# Patient Record
Sex: Male | Born: 1960 | Race: White | Hispanic: No | Smoking: Former smoker
Health system: Southern US, Community
[De-identification: ages and names within clinical notes are randomized; demographics above are authoritative.]

## PROBLEM LIST (undated history)

## (undated) DIAGNOSIS — T8859XA Other complications of anesthesia, initial encounter: Secondary | ICD-10-CM

## (undated) DIAGNOSIS — Z9289 Personal history of other medical treatment: Secondary | ICD-10-CM

## (undated) DIAGNOSIS — I739 Peripheral vascular disease, unspecified: Secondary | ICD-10-CM

## (undated) DIAGNOSIS — I6529 Occlusion and stenosis of unspecified carotid artery: Secondary | ICD-10-CM

## (undated) DIAGNOSIS — I509 Heart failure, unspecified: Secondary | ICD-10-CM

## (undated) DIAGNOSIS — T4145XA Adverse effect of unspecified anesthetic, initial encounter: Secondary | ICD-10-CM

## (undated) DIAGNOSIS — E119 Type 2 diabetes mellitus without complications: Secondary | ICD-10-CM

## (undated) DIAGNOSIS — M1990 Unspecified osteoarthritis, unspecified site: Secondary | ICD-10-CM

## (undated) DIAGNOSIS — I10 Essential (primary) hypertension: Secondary | ICD-10-CM

## (undated) DIAGNOSIS — K5792 Diverticulitis of intestine, part unspecified, without perforation or abscess without bleeding: Secondary | ICD-10-CM

## (undated) HISTORY — DX: Occlusion and stenosis of unspecified carotid artery: I65.29

## (undated) HISTORY — PX: HERNIA REPAIR: SHX51

## (undated) HISTORY — PX: COLONOSCOPY: SHX174

## (undated) HISTORY — PX: TONSILLECTOMY: SUR1361

## (undated) HISTORY — DX: Heart failure, unspecified: I50.9

## (undated) HISTORY — PX: COLON SURGERY: SHX602

## (undated) HISTORY — DX: Essential (primary) hypertension: I10

## (undated) HISTORY — DX: Diverticulitis of intestine, part unspecified, without perforation or abscess without bleeding: K57.92

## (undated) HISTORY — DX: Type 2 diabetes mellitus without complications: E11.9

---
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
## 2000-03-16 ENCOUNTER — Encounter: Payer: Self-pay | Admitting: Emergency Medicine

## 2000-03-16 ENCOUNTER — Inpatient Hospital Stay (HOSPITAL_COMMUNITY): Admission: EM | Admit: 2000-03-16 | Discharge: 2000-03-18 | Payer: Self-pay | Admitting: Emergency Medicine

## 2001-08-12 ENCOUNTER — Encounter: Payer: Self-pay | Admitting: Emergency Medicine

## 2001-08-12 ENCOUNTER — Emergency Department (HOSPITAL_COMMUNITY): Admission: EM | Admit: 2001-08-12 | Discharge: 2001-08-12 | Payer: Self-pay | Admitting: Emergency Medicine

## 2002-05-03 ENCOUNTER — Encounter: Payer: Self-pay | Admitting: Urology

## 2002-05-03 ENCOUNTER — Ambulatory Visit (HOSPITAL_COMMUNITY): Admission: RE | Admit: 2002-05-03 | Discharge: 2002-05-03 | Payer: Self-pay | Admitting: Urology

## 2005-08-18 ENCOUNTER — Emergency Department (HOSPITAL_COMMUNITY): Admission: EM | Admit: 2005-08-18 | Discharge: 2005-08-18 | Payer: Self-pay | Admitting: Family Medicine

## 2005-11-20 ENCOUNTER — Emergency Department (HOSPITAL_COMMUNITY): Admission: EM | Admit: 2005-11-20 | Discharge: 2005-11-20 | Payer: Self-pay | Admitting: Family Medicine

## 2006-10-07 ENCOUNTER — Emergency Department (HOSPITAL_COMMUNITY): Admission: EM | Admit: 2006-10-07 | Discharge: 2006-10-07 | Payer: Self-pay | Admitting: Family Medicine

## 2006-11-28 ENCOUNTER — Ambulatory Visit: Payer: Self-pay | Admitting: Family Medicine

## 2006-12-01 ENCOUNTER — Ambulatory Visit: Payer: Self-pay | Admitting: Family Medicine

## 2006-12-01 LAB — CONVERTED CEMR LAB
ALT: 14 units/L (ref 0–53)
AST: 20 units/L (ref 0–37)
Calcium: 9.2 mg/dL (ref 8.4–10.5)
Cholesterol: 208 mg/dL — ABNORMAL HIGH (ref 0–200)
Glucose, Bld: 71 mg/dL (ref 70–99)
HDL: 31 mg/dL — ABNORMAL LOW (ref >39)
LDL Cholesterol: 154 mg/dL — ABNORMAL HIGH (ref 0–99)
Sodium: 136 meq/L (ref 135–145)
VLDL: 23 mg/dL (ref 0–40)

## 2007-02-01 ENCOUNTER — Telehealth: Payer: Self-pay | Admitting: Family Medicine

## 2007-05-23 ENCOUNTER — Encounter: Payer: Self-pay | Admitting: Family Medicine

## 2007-05-23 DIAGNOSIS — E785 Hyperlipidemia, unspecified: Secondary | ICD-10-CM

## 2007-05-23 DIAGNOSIS — J309 Allergic rhinitis, unspecified: Secondary | ICD-10-CM | POA: Insufficient documentation

## 2007-05-23 DIAGNOSIS — I10 Essential (primary) hypertension: Secondary | ICD-10-CM | POA: Insufficient documentation

## 2007-05-23 DIAGNOSIS — E669 Obesity, unspecified: Secondary | ICD-10-CM

## 2008-09-19 DIAGNOSIS — J189 Pneumonia, unspecified organism: Secondary | ICD-10-CM

## 2008-09-19 HISTORY — DX: Pneumonia, unspecified organism: J18.9

## 2010-09-03 ENCOUNTER — Encounter
Admission: RE | Admit: 2010-09-03 | Discharge: 2010-09-03 | Payer: Self-pay | Source: Home / Self Care | Attending: Gastroenterology | Admitting: Gastroenterology

## 2010-09-03 ENCOUNTER — Inpatient Hospital Stay (HOSPITAL_COMMUNITY): Admission: AD | Admit: 2010-09-03 | Discharge: 2010-09-10 | Payer: Self-pay | Source: Home / Self Care

## 2010-09-24 ENCOUNTER — Encounter
Admission: RE | Admit: 2010-09-24 | Discharge: 2010-09-24 | Payer: Self-pay | Source: Home / Self Care | Attending: General Surgery | Admitting: General Surgery

## 2010-10-09 ENCOUNTER — Other Ambulatory Visit: Payer: Self-pay | Admitting: General Surgery

## 2010-10-09 DIAGNOSIS — K5792 Diverticulitis of intestine, part unspecified, without perforation or abscess without bleeding: Secondary | ICD-10-CM

## 2010-10-09 DIAGNOSIS — IMO0002 Reserved for concepts with insufficient information to code with codable children: Secondary | ICD-10-CM

## 2010-10-22 ENCOUNTER — Ambulatory Visit
Admission: RE | Admit: 2010-10-22 | Discharge: 2010-10-22 | Disposition: A | Discharge status: Home / Self Care | Payer: Self-pay | Source: Ambulatory Visit | Attending: General Surgery | Admitting: General Surgery

## 2010-10-22 DIAGNOSIS — IMO0002 Reserved for concepts with insufficient information to code with codable children: Secondary | ICD-10-CM

## 2010-10-22 DIAGNOSIS — K5792 Diverticulitis of intestine, part unspecified, without perforation or abscess without bleeding: Secondary | ICD-10-CM

## 2010-10-22 MED ORDER — IOHEXOL 300 MG/ML  SOLN: 100.0000 mL | Freq: Once | INTRAMUSCULAR | Status: AC | PRN

## 2010-11-17 ENCOUNTER — Other Ambulatory Visit (HOSPITAL_COMMUNITY): Payer: Self-pay | Admitting: General Surgery

## 2010-11-17 ENCOUNTER — Encounter (HOSPITAL_COMMUNITY)
Admission: RE | Admit: 2010-11-17 | Discharge: 2010-11-17 | Disposition: A | Discharge status: Home / Self Care | Payer: BC Managed Care – PPO | Source: Ambulatory Visit | Attending: General Surgery | Admitting: General Surgery

## 2010-11-17 ENCOUNTER — Ambulatory Visit (HOSPITAL_COMMUNITY)
Admission: RE | Admit: 2010-11-17 | Discharge: 2010-11-17 | Disposition: A | Discharge status: Home / Self Care | Payer: BC Managed Care – PPO | Source: Ambulatory Visit | Attending: General Surgery | Admitting: General Surgery

## 2010-11-17 DIAGNOSIS — Z01812 Encounter for preprocedural laboratory examination: Secondary | ICD-10-CM | POA: Insufficient documentation

## 2010-11-17 DIAGNOSIS — K5732 Diverticulitis of large intestine without perforation or abscess without bleeding: Secondary | ICD-10-CM

## 2010-11-17 DIAGNOSIS — Z01818 Encounter for other preprocedural examination: Secondary | ICD-10-CM | POA: Insufficient documentation

## 2010-11-17 DIAGNOSIS — Z87891 Personal history of nicotine dependence: Secondary | ICD-10-CM | POA: Insufficient documentation

## 2010-11-17 DIAGNOSIS — Z01810 Encounter for preprocedural cardiovascular examination: Secondary | ICD-10-CM | POA: Insufficient documentation

## 2010-11-17 LAB — SURGICAL PCR SCREEN: MRSA, PCR: NEGATIVE

## 2010-11-17 LAB — COMPREHENSIVE METABOLIC PANEL
Albumin: 4 g/dL (ref 3.5–5.2)
BUN: 9 mg/dL (ref 6–23)
CO2: 28 mEq/L (ref 19–32)
Chloride: 107 mEq/L (ref 96–112)
GFR calc Af Amer: >60 mL/min (ref >60)
Potassium: 5.2 mEq/L — ABNORMAL HIGH (ref 3.5–5.1)
Sodium: 142 mEq/L (ref 135–145)
Total Bilirubin: 0.3 mg/dL (ref 0.3–1.2)

## 2010-11-17 LAB — CBC
HCT: 46.1 % (ref 39.0–52.0)
MCH: 31.7 pg (ref 26.0–34.0)
MCHC: 34.9 g/dL (ref 30.0–36.0)
Platelets: 299 10*3/uL (ref 150–400)
RDW: 15.8 % — ABNORMAL HIGH (ref 11.5–15.5)
WBC: 17.2 10*3/uL — ABNORMAL HIGH (ref 4.0–10.5)

## 2010-11-17 LAB — DIFFERENTIAL
Monocytes Absolute: 1.5 10*3/uL — ABNORMAL HIGH (ref 0.1–1.0)
Monocytes Relative: 9 % (ref 3–12)
Neutro Abs: 13.1 10*3/uL — ABNORMAL HIGH (ref 1.7–7.7)
Neutrophils Relative %: 76 % (ref 43–77)

## 2010-11-18 ENCOUNTER — Other Ambulatory Visit: Payer: Self-pay | Admitting: General Surgery

## 2010-11-18 DIAGNOSIS — K5792 Diverticulitis of intestine, part unspecified, without perforation or abscess without bleeding: Secondary | ICD-10-CM

## 2010-11-18 DIAGNOSIS — D72829 Elevated white blood cell count, unspecified: Secondary | ICD-10-CM

## 2010-11-19 ENCOUNTER — Ambulatory Visit
Admission: RE | Admit: 2010-11-19 | Discharge: 2010-11-19 | Disposition: A | Discharge status: Home / Self Care | Payer: BC Managed Care – PPO | Source: Ambulatory Visit | Attending: General Surgery | Admitting: General Surgery

## 2010-11-19 ENCOUNTER — Other Ambulatory Visit: Payer: BC Managed Care – PPO

## 2010-11-19 DIAGNOSIS — K5792 Diverticulitis of intestine, part unspecified, without perforation or abscess without bleeding: Secondary | ICD-10-CM

## 2010-11-19 DIAGNOSIS — D72829 Elevated white blood cell count, unspecified: Secondary | ICD-10-CM

## 2010-11-19 MED ORDER — IOHEXOL 300 MG/ML  SOLN
100.0000 mL | Freq: Once | INTRAMUSCULAR | Status: AC | PRN
  Administered 2010-11-19: 100 mL via INTRAVENOUS

## 2010-11-25 ENCOUNTER — Other Ambulatory Visit: Payer: Self-pay | Admitting: General Surgery

## 2010-11-25 ENCOUNTER — Inpatient Hospital Stay (HOSPITAL_COMMUNITY): Payer: BC Managed Care – PPO

## 2010-11-25 ENCOUNTER — Inpatient Hospital Stay (HOSPITAL_COMMUNITY)
Admission: RE | Admit: 2010-11-25 | Discharge: 2010-12-07 | DRG: 585 | Disposition: A | Discharge status: Home / Self Care | Payer: BC Managed Care – PPO | Source: Ambulatory Visit | Attending: General Surgery | Admitting: General Surgery

## 2010-11-25 DIAGNOSIS — R000 Tachycardia, unspecified: Secondary | ICD-10-CM | POA: Diagnosis not present

## 2010-11-25 DIAGNOSIS — J189 Pneumonia, unspecified organism: Secondary | ICD-10-CM | POA: Diagnosis not present

## 2010-11-25 DIAGNOSIS — D62 Acute posthemorrhagic anemia: Secondary | ICD-10-CM | POA: Diagnosis not present

## 2010-11-25 DIAGNOSIS — F17200 Nicotine dependence, unspecified, uncomplicated: Secondary | ICD-10-CM | POA: Diagnosis present

## 2010-11-25 DIAGNOSIS — A0472 Enterocolitis due to Clostridium difficile, not specified as recurrent: Secondary | ICD-10-CM | POA: Diagnosis not present

## 2010-11-25 DIAGNOSIS — K5732 Diverticulitis of large intestine without perforation or abscess without bleeding: Principal | ICD-10-CM | POA: Diagnosis present

## 2010-11-25 DIAGNOSIS — K560 Paralytic ileus: Secondary | ICD-10-CM | POA: Diagnosis not present

## 2010-11-25 DIAGNOSIS — I10 Essential (primary) hypertension: Secondary | ICD-10-CM | POA: Diagnosis present

## 2010-11-25 LAB — CBC
HCT: 43.2 % (ref 39.0–52.0)
Hemoglobin: 10.6 g/dL — ABNORMAL LOW (ref 13.0–17.0)
MCH: 30.9 pg (ref 26.0–34.0)
Platelets: 281 10*3/uL (ref 150–400)
RBC: 3.43 MIL/uL — ABNORMAL LOW (ref 4.22–5.81)
RDW: 15.2 % (ref 11.5–15.5)
WBC: 12.2 10*3/uL — ABNORMAL HIGH (ref 4.0–10.5)
WBC: 25.6 10*3/uL — ABNORMAL HIGH (ref 4.0–10.5)

## 2010-11-25 LAB — DIFFERENTIAL
Basophils Relative: 0 % (ref 0–1)
Eosinophils Absolute: 0.5 10*3/uL (ref 0.0–0.7)
Eosinophils Relative: 4 % (ref 0–5)
Lymphocytes Relative: 19 % (ref 12–46)
Lymphs Abs: 2.3 10*3/uL (ref 0.7–4.0)
Monocytes Absolute: 1.1 10*3/uL — ABNORMAL HIGH (ref 0.1–1.0)
Monocytes Relative: 9 % (ref 3–12)
Neutro Abs: 8.3 10*3/uL — ABNORMAL HIGH (ref 1.7–7.7)
Neutrophils Relative %: 68 % (ref 43–77)

## 2010-11-25 LAB — BASIC METABOLIC PANEL
BUN: 7 mg/dL (ref 6–23)
CO2: 18 mEq/L — ABNORMAL LOW (ref 19–32)
Glucose, Bld: 131 mg/dL — ABNORMAL HIGH (ref 70–99)
Sodium: 133 mEq/L — ABNORMAL LOW (ref 135–145)

## 2010-11-25 LAB — MAGNESIUM: Magnesium: 1.7 mg/dL (ref 1.5–2.5)

## 2010-11-26 ENCOUNTER — Inpatient Hospital Stay (HOSPITAL_COMMUNITY): Payer: BC Managed Care – PPO

## 2010-11-26 LAB — BASIC METABOLIC PANEL
BUN: 18 mg/dL (ref 6–23)
BUN: 26 mg/dL — ABNORMAL HIGH (ref 6–23)
CO2: 22 mEq/L (ref 19–32)
CO2: 21 mEq/L (ref 19–32)
Chloride: 108 mEq/L (ref 96–112)
Creatinine, Ser: 1.79 mg/dL — ABNORMAL HIGH (ref 0.4–1.5)
GFR calc Af Amer: 57 mL/min — ABNORMAL LOW (ref >60)
GFR calc non Af Amer: 40 mL/min — ABNORMAL LOW (ref >60)
GFR calc non Af Amer: 47 mL/min — ABNORMAL LOW (ref >60)
Glucose, Bld: 113 mg/dL — ABNORMAL HIGH (ref 70–99)
Potassium: 5.2 mEq/L — ABNORMAL HIGH (ref 3.5–5.1)
Potassium: 4 mEq/L (ref 3.5–5.1)
Sodium: 137 mEq/L (ref 135–145)

## 2010-11-26 LAB — CBC
MCH: 31.3 pg (ref 26.0–34.0)
MCV: 87.5 fL (ref 78.0–100.0)
RBC: 2.72 MIL/uL — ABNORMAL LOW (ref 4.22–5.81)
RDW: 15.5 % (ref 11.5–15.5)
WBC: 19.2 10*3/uL — ABNORMAL HIGH (ref 4.0–10.5)
WBC: 16.8 10*3/uL — ABNORMAL HIGH (ref 4.0–10.5)

## 2010-11-27 LAB — CBC
Hemoglobin: 8.3 g/dL — ABNORMAL LOW (ref 13.0–17.0)
Hemoglobin: 8.3 g/dL — ABNORMAL LOW (ref 13.0–17.0)
MCH: 30.6 pg (ref 26.0–34.0)
MCH: 30.5 pg (ref 26.0–34.0)
MCV: 88.6 fL (ref 78.0–100.0)
MCV: 87.9 fL (ref 78.0–100.0)
RBC: 2.71 MIL/uL — ABNORMAL LOW (ref 4.22–5.81)
RBC: 2.72 MIL/uL — ABNORMAL LOW (ref 4.22–5.81)

## 2010-11-27 LAB — BASIC METABOLIC PANEL
BUN: 22 mg/dL (ref 6–23)
Chloride: 110 mEq/L (ref 96–112)
Glucose, Bld: 103 mg/dL — ABNORMAL HIGH (ref 70–99)
Potassium: 3.8 mEq/L (ref 3.5–5.1)

## 2010-11-27 LAB — MAGNESIUM: Magnesium: 2 mg/dL (ref 1.5–2.5)

## 2010-11-28 LAB — BASIC METABOLIC PANEL
BUN: 20 mg/dL (ref 6–23)
CO2: 18 mEq/L — ABNORMAL LOW (ref 19–32)
Chloride: 112 mEq/L (ref 96–112)
GFR calc non Af Amer: >60 mL/min (ref >60)
Glucose, Bld: 96 mg/dL (ref 70–99)
Potassium: 3.8 mEq/L (ref 3.5–5.1)

## 2010-11-28 LAB — CBC
HCT: 25.7 % — ABNORMAL LOW (ref 39.0–52.0)
Hemoglobin: 8.9 g/dL — ABNORMAL LOW (ref 13.0–17.0)
MCV: 87.7 fL (ref 78.0–100.0)
RDW: 15.9 % — ABNORMAL HIGH (ref 11.5–15.5)
WBC: 19 10*3/uL — ABNORMAL HIGH (ref 4.0–10.5)

## 2010-11-28 LAB — TYPE AND SCREEN
ABO/RH(D): O POS
Unit division: 0
Unit division: 0
Unit division: 0

## 2010-11-29 ENCOUNTER — Inpatient Hospital Stay (HOSPITAL_COMMUNITY): Payer: BC Managed Care – PPO

## 2010-11-29 LAB — COMPREHENSIVE METABOLIC PANEL
ALT: 19 U/L (ref 0–53)
AST: 21 U/L (ref 0–37)
Albumin: 3.4 g/dL — ABNORMAL LOW (ref 3.5–5.2)
CO2: 20 mEq/L (ref 19–32)
Calcium: 8.9 mg/dL (ref 8.4–10.5)
Creatinine, Ser: 0.65 mg/dL (ref 0.4–1.5)
GFR calc Af Amer: >60 mL/min (ref >60)
GFR calc non Af Amer: >60 mL/min (ref >60)
Sodium: 129 mEq/L — ABNORMAL LOW (ref 135–145)
Total Protein: 7.8 g/dL (ref 6.0–8.3)

## 2010-11-29 LAB — URINALYSIS, ROUTINE W REFLEX MICROSCOPIC
Bilirubin Urine: NEGATIVE
Bilirubin Urine: NEGATIVE
Glucose, UA: NEGATIVE mg/dL
Ketones, ur: NEGATIVE mg/dL
Nitrite: NEGATIVE
Specific Gravity, Urine: 1.008 (ref 1.005–1.030)
Urobilinogen, UA: 1 mg/dL (ref 0.0–1.0)
pH: 5.5 (ref 5.0–8.0)
pH: 6 (ref 5.0–8.0)

## 2010-11-29 LAB — CBC
HCT: 22.2 % — ABNORMAL LOW (ref 39.0–52.0)
HCT: 35.7 % — ABNORMAL LOW (ref 39.0–52.0)
HCT: 35.9 % — ABNORMAL LOW (ref 39.0–52.0)
Hemoglobin: 7.7 g/dL — ABNORMAL LOW (ref 13.0–17.0)
Hemoglobin: 14.5 g/dL (ref 13.0–17.0)
MCH: 30.6 pg (ref 26.0–34.0)
MCH: 31 pg (ref 26.0–34.0)
MCH: 30.3 pg (ref 26.0–34.0)
MCHC: 33.7 g/dL (ref 30.0–36.0)
MCHC: 34.2 g/dL (ref 30.0–36.0)
MCHC: 35.3 g/dL (ref 30.0–36.0)
MCHC: 34.8 g/dL (ref 30.0–36.0)
MCV: 89.5 fL (ref 78.0–100.0)
Platelets: 405 10*3/uL — ABNORMAL HIGH (ref 150–400)
Platelets: 420 10*3/uL — ABNORMAL HIGH (ref 150–400)
Platelets: 446 10*3/uL — ABNORMAL HIGH (ref 150–400)
Platelets: 245 10*3/uL (ref 150–400)
RBC: 2.53 MIL/uL — ABNORMAL LOW (ref 4.22–5.81)
RBC: 4.67 MIL/uL (ref 4.22–5.81)
RBC: 2.67 MIL/uL — ABNORMAL LOW (ref 4.22–5.81)
RDW: 13.3 % (ref 11.5–15.5)
RDW: 13.3 % (ref 11.5–15.5)
WBC: 17.6 10*3/uL — ABNORMAL HIGH (ref 4.0–10.5)
WBC: 13.8 10*3/uL — ABNORMAL HIGH (ref 4.0–10.5)
WBC: 9.5 10*3/uL (ref 4.0–10.5)

## 2010-11-29 LAB — URINE MICROSCOPIC-ADD ON

## 2010-11-29 LAB — BASIC METABOLIC PANEL
BUN: 1 mg/dL — ABNORMAL LOW (ref 6–23)
BUN: 4 mg/dL — ABNORMAL LOW (ref 6–23)
CO2: 20 mEq/L (ref 19–32)
CO2: 23 mEq/L (ref 19–32)
Chloride: 103 mEq/L (ref 96–112)
Creatinine, Ser: 0.61 mg/dL (ref 0.4–1.5)
Creatinine, Ser: 0.68 mg/dL (ref 0.4–1.5)
GFR calc Af Amer: >60 mL/min (ref >60)
GFR calc non Af Amer: >60 mL/min (ref >60)
Glucose, Bld: 109 mg/dL — ABNORMAL HIGH (ref 70–99)
Glucose, Bld: 76 mg/dL (ref 70–99)
Glucose, Bld: 94 mg/dL (ref 70–99)
Potassium: 3.3 mEq/L — ABNORMAL LOW (ref 3.5–5.1)
Potassium: 3.9 mEq/L (ref 3.5–5.1)
Potassium: 4 mEq/L (ref 3.5–5.1)
Sodium: 136 mEq/L (ref 135–145)

## 2010-11-29 LAB — EXPECTORATED SPUTUM ASSESSMENT W GRAM STAIN, RFLX TO RESP C

## 2010-11-30 LAB — EXPECTORATED SPUTUM ASSESSMENT W GRAM STAIN, RFLX TO RESP C

## 2010-11-30 LAB — URINE CULTURE
Culture: NO GROWTH
Special Requests: NEGATIVE

## 2010-11-30 LAB — CBC
Hemoglobin: 8.7 g/dL — ABNORMAL LOW (ref 13.0–17.0)
MCH: 30.1 pg (ref 26.0–34.0)
MCV: 87.2 fL (ref 78.0–100.0)
Platelets: 264 10*3/uL (ref 150–400)
RBC: 2.89 MIL/uL — ABNORMAL LOW (ref 4.22–5.81)
WBC: 18.6 10*3/uL — ABNORMAL HIGH (ref 4.0–10.5)

## 2010-11-30 LAB — BASIC METABOLIC PANEL
BUN: 9 mg/dL (ref 6–23)
CO2: 22 mEq/L (ref 19–32)
Chloride: 107 mEq/L (ref 96–112)
Creatinine, Ser: 1.02 mg/dL (ref 0.4–1.5)
GFR calc Af Amer: >60 mL/min (ref >60)
Potassium: 3.5 mEq/L (ref 3.5–5.1)

## 2010-12-01 LAB — BASIC METABOLIC PANEL
BUN: 9 mg/dL (ref 6–23)
CO2: 21 mEq/L (ref 19–32)
Calcium: 8.3 mg/dL — ABNORMAL LOW (ref 8.4–10.5)
Creatinine, Ser: 1.12 mg/dL (ref 0.4–1.5)
GFR calc non Af Amer: >60 mL/min (ref >60)
Glucose, Bld: 121 mg/dL — ABNORMAL HIGH (ref 70–99)

## 2010-12-01 LAB — CBC
HCT: 24.7 % — ABNORMAL LOW (ref 39.0–52.0)
Hemoglobin: 8.5 g/dL — ABNORMAL LOW (ref 13.0–17.0)
MCH: 29.9 pg (ref 26.0–34.0)
MCHC: 34.4 g/dL (ref 30.0–36.0)
MCV: 87 fL (ref 78.0–100.0)
RDW: 15.1 % (ref 11.5–15.5)

## 2010-12-02 LAB — CBC
Hemoglobin: 7.1 g/dL — ABNORMAL LOW (ref 13.0–17.0)
MCH: 30.1 pg (ref 26.0–34.0)
MCHC: 34.6 g/dL (ref 30.0–36.0)
Platelets: 386 10*3/uL (ref 150–400)
RDW: 15.1 % (ref 11.5–15.5)

## 2010-12-02 LAB — CLOSTRIDIUM DIFFICILE BY PCR

## 2010-12-02 NOTE — Op Note (Signed)
[name], [name] NO.:  [id number]  MEDICAL RECORD NO.:  [id number]           PATIENT TYPE:  I  LOCATION:  3311                         FACILITY:  [facility]  PHYSICIAN:  [name], MDDATE OF BIRTH:  25-Dec-1960  DATE OF PROCEDURE:  11/25/2010 DATE OF DISCHARGE:                              OPERATIVE REPORT   PREOPERATIVE DIAGNOSIS:  Persistent diverticulitis status post abscess drainage.  POSTOPERATIVE DIAGNOSIS:  Persistent diverticulitis status post abscess drainage.  PROCEDURES: 1. Diagnostic laparoscopy. 2. Open sigmoid colectomy with 33-mm EEA primary anastomosis. 3. Appendectomy. 4. Ureteral stents by Dr. Larey [name].  SURGEON:  [name] Gosling, MD  ASSISTANT:  [name]. [name] Dakins, MD  ANESTHESIA:  General.  SUPERVISING ANESTHESIOLOGIST:  [name] Beach, MD  SPECIMENS: 1. Sigmoid colon. 2. Appendix.  ESTIMATED BLOOD LOSS:  100 mL.  COMPLICATIONS:  None.  DRAINS:  A 19-French Blake drain to the pelvis.  DISPOSITION:  To recovery room in stable condition.  INDICATIONS:  This is a 50 year old male with multiple episodes of diverticulitis.  His last episode really began in the summer and has really not gone away completely.  If he has been off antibiotics, is flared backup every time.  He had a colonoscopy in mid November, which showed multiple diverticula, no bleeding, but narrowing and marked edema and inflammation over the sigmoid colon.  He also had an abscess in December that was percutaneously drained, eventually had this removed, and this resolved.  He continued to still have some symptoms.  White blood cell count was still 17 prior to this.  He and I discussed a sigmoid colectomy and I would attempt to do laparoscopically, but we discussed the risks including an open procedure.  PROCEDURE:  After informed consent was obtained, the patient was taken to the operating room.  He underwent a bowel preparation the  day before. He was administered 1 g of intravenous Invanz.  Sequential compression devices were placed on lower extremities prior to induction of anesthesia.  He was then placed on general endotracheal anesthesia without complications.  He was placed in lithotomy.  Dr. Vonita [name] first placed the ureteral stents.  I then came in the room.  He was re-prepped and draped in a standard sterile surgical fashion.  A surgical time-out was then performed.  I then infiltrated 0.25% Marcaine in the left upper quadrant, made an incision, and I entered the abdominal cavity with a 5-mm OptiVu trocar. This was done without any injury.  He was then insufflated to 50 mmHg pressure.  I placed a 10-mm port just below his umbilicus.  Another 5-mm port supraumbilical region and another 5-mm port in the right lower quadrant under direct vision without complication.  He had very little space to work with, but there was a rock hard mass that was very adherent to the surrounding structures in the pelvis.  After some time of attempting to get a good view laparoscopically and take this down, I decided that it would be just safer given this area to do this in an open fashion.  I then made an incision around his umbilicus and inserted a Bookwalter retractor.  Finger fracture dissection was used to remove the appendix, the sigmoid colon from its rock hard attachments to both the sidewall as well as around towards the bladder where this was not as hard of an attachment, but it certainly was stuck to his bladder without any evidence of a fistula.  This eventually was freed.  There was a fairly short segment of disease.  We released the white line of Toldt in its entirety on the left side.  I then stapled across with the left colon proximally and marked this with a stitch.  Then we identified both stents and both ureters were identified and preserved throughout the operation.  The bladder was not injured either.  He did  not make really any urine throughout the operation until the end when I milked his Foley and we put the stent down and took them off his leg.  I discussed this with Dr. Vonita [name] during the operation.  I then took his mesentery down with a combination of Kelly's, 2-0 silk ties, and the LigaSure device all the way down into his high rectum where this was healthy.  We dissected this rectum free and came across this with a contour stapler. Irrigation was then performed, inspected the ureters, both of these were intact.  The irrigation was clear.  There were a couple smaller areas that were bleeding that were oversewed with 3-0 silk sutures.  At this point, we did remove his appendix with a GIA stapler and I came across his mesentery with the LigaSure device and tied this with a 2-0 silk suture.  I then opened his proximal colon, a 33 mm sizer fit this well. I then placed a pursestring suture of 2-0 Prolene.  This was cleaned off.  I placed an additional 3-0 silk suture to secure this around the anvil, this all appeared nice and healthy.  The proximal colon reached down into the pelvis without any tension at all.  I then went below and I placed the EEA stapler and this was brought out through the staple line, mated to the anvil, and pulled down and fired.  There were two complete donuts after removing this.  This was then tested with a rigid proctoscope from the 33-mm anastomosis and this was watertight and the anastomosis looked well and was patent with the proctoscope also.  There was an abscess cavity posterior to the sigmoid colon, but this was just a chronic cavity and there was no active infection with this at all.  He did well during the operation.  Dr. Zachery [name] and I both discussed a loop ileostomy, which I had marked for previously and both of [place] thought it would be reasonable given our findings at the operation not to do a loop ileostomy as this was a high anastomosis under no  tension, looked very healthy, and there was no active infection at all during the case. He was then irrigated.  I did place a 19-French Blake drain into the pelvis underneath this and secured this with a 2-0 nylon.  It was then irrigated copiously.  The omentum was brought down over the bowel. This was then closed with #1 looped PDS.  Staples were used to close the skin and I left Telfa wicks throughout his incision as well.  Sterile dressings were placed.  I removed his stents.  He tolerated this well, was extubated in the operating room, and transferred to the recovery room in a stable condition.     [name] Gosling, MD  MCW/MEDQ  D:  11/25/2010  T:  11/26/2010  Job:  [id number]  cc:   Fayrene Fearing L. Malon Kindle., M.D.  Electronically Signed by Emelia Loron MD on 12/02/2010 09:27:39 AM

## 2010-12-03 LAB — CROSSMATCH
ABO/RH(D): O POS
Unit division: 0

## 2010-12-03 LAB — CBC
HCT: 26.5 % — ABNORMAL LOW (ref 39.0–52.0)
Hemoglobin: 9.1 g/dL — ABNORMAL LOW (ref 13.0–17.0)
MCHC: 34.3 g/dL (ref 30.0–36.0)
MCV: 87.2 fL (ref 78.0–100.0)
WBC: 13.5 10*3/uL — ABNORMAL HIGH (ref 4.0–10.5)

## 2010-12-03 LAB — COMPREHENSIVE METABOLIC PANEL
ALT: 48 U/L (ref 0–53)
Alkaline Phosphatase: 77 U/L (ref 39–117)
CO2: 23 mEq/L (ref 19–32)
GFR calc non Af Amer: >60 mL/min (ref >60)
Glucose, Bld: 113 mg/dL — ABNORMAL HIGH (ref 70–99)
Potassium: 3.4 mEq/L — ABNORMAL LOW (ref 3.5–5.1)
Sodium: 145 mEq/L (ref 135–145)

## 2010-12-03 NOTE — Op Note (Signed)
  [name], [name]           ACCOUNT NO.:  [id number]  MEDICAL RECORD NO.:  [id number]           PATIENT TYPE:  I  LOCATION:  3311                         FACILITY:  [facility]  PHYSICIAN:  Maretta Bees. Vonita Moss, M.D.DATE OF BIRTH:  1961/02/08  DATE OF PROCEDURE:  11/25/2010 DATE OF DISCHARGE:                              OPERATIVE REPORT   PREOPERATIVE DIAGNOSIS:  Exploratory laparotomy for diverticulitis.  POSTOPERATIVE DIAGNOSIS:  Exploratory laparotomy for diverticulitis.  PROCEDURE:  Cystoscopy and insertion of bilateral ureteral catheters.  SURGEON:  Maretta Bees. Vonita Moss, MD  ANESTHESIA:  General.  INDICATIONS:  This gentleman is about to undergo complicated intra- abdominal surgery and Dr. Dwain Sarna requested placement of ureteral catheters at the beginning of the case.  The patient was seen preoperatively and advised about the risks.  PROCEDURE:  The patient was brought to the operating room and placed in lithotomy position.  The external genitalia prepped and draped in the usual fashion.  He was cystoscoped.  The anterior urethra was normal. The prostate was quite small.  The bladder had very minimal trabeculation, but no stones, tumors, or signs of fistulas.  The ureteral orifices were small and slightly difficult to see, but I had no problem inserting a whistle-tip catheter 25 cm up each ureter, and the catheter with blue marker was placed up the left and the marker with right markers was placed up the right side.  The bladder was emptied, scope removed, and the ureteral catheters along with the Foley catheter were connected to close drainage and taped in place.  Dr. Dwain Sarna then continued with his procedure.     Maretta Bees. Vonita Moss, M.D.     LJP/MEDQ  D:  11/25/2010  T:  11/25/2010  Job:  [id number]  Electronically Signed by [name] M.D. on 12/03/2010 09:31:52 AM

## 2010-12-05 LAB — BASIC METABOLIC PANEL
GFR calc Af Amer: >60 mL/min (ref >60)
GFR calc non Af Amer: >60 mL/min (ref >60)
Potassium: 3.3 mEq/L — ABNORMAL LOW (ref 3.5–5.1)
Sodium: 141 mEq/L (ref 135–145)

## 2010-12-05 LAB — CBC
Platelets: 566 10*3/uL — ABNORMAL HIGH (ref 150–400)
RDW: 15.1 % (ref 11.5–15.5)
WBC: 13 10*3/uL — ABNORMAL HIGH (ref 4.0–10.5)

## 2010-12-05 LAB — CULTURE, BLOOD (ROUTINE X 2): Culture  Setup Time: 201203120918

## 2010-12-07 LAB — BASIC METABOLIC PANEL
Chloride: 113 mEq/L — ABNORMAL HIGH (ref 96–112)
Creatinine, Ser: 1.07 mg/dL (ref 0.4–1.5)
GFR calc Af Amer: >60 mL/min (ref >60)
Potassium: 3.4 mEq/L — ABNORMAL LOW (ref 3.5–5.1)
Sodium: 140 mEq/L (ref 135–145)

## 2010-12-09 NOTE — Discharge Summary (Signed)
[name], [name]           ACCOUNT NO.:  [id number]  MEDICAL RECORD NO.:  [id number]           PATIENT TYPE:  I  LOCATION:  5154                         FACILITY:  [facility]  PHYSICIAN:  Juanetta Gosling, MDDATE OF BIRTH:  1960-11-26  DATE OF ADMISSION:  11/25/2010 DATE OF DISCHARGE:  12/07/2010                              DISCHARGE SUMMARY   ADMISSION DIAGNOSES: 1. Persistent diverticulitis. 2. Hypertension. 3. Tobacco abuse.  DISCHARGE DIAGNOSES: 1. Persistent diverticulitis. 2. Hypertension. 3. Tobacco abuse. 4. Status post sigmoid colectomy. 5. Clostridium difficile infection. 6. Hospital-acquired pneumonia. 7. Acute blood loss anemia. 8. Ileus.  HISTORY AND HOSPITAL COURSE:  [name] is a 50 year old male who had really a long episode of diverticular disease, started on last summer.  He had a colonoscopy in mid November, which showed multiple diverticula with no bleeding, did have an area of marked edema and inflammation in the sigmoid colon.  He had an abscess that was drained in December that was fairly large in his pelvis and eventually his drain was pulled.  He continued to have some symptoms from his diverticular disease and I maintained him on antibiotics.  Eventually, we both agreed that he should go to the operating room.  I prepped him at home and took him to the operating room on March 8.  Dr. Larey Dresser placed stents in his ureters.  I then did a diagnostic laparoscopy followed by an open sigmoid colectomy with a 33-mm EEA primary anastomosis and an appendectomy.  He was maintained in the step-down unit immediately.  He had a bleed into his pelvis and he received about 5 units of blood postoperatively over a period of about 12 days.  He also was noted to be hypotensive and was oliguric immediately and coming out of the operating room and required a fair amount of volume to get his blood pressure.  He had a PICC line placed the following  day.  He remained n.p.o. initially. He improved over the next couple of days and stabilized his hematocrit at about 24.  He was maintained on Invanz due to an active diverticular infection with an abscess at the time of  his surgery.  His pathology ended up showing acute diverticulitis with associated acute abscess and 4 lymph nodes were negative.  The appendix was normal.  On postoperative day #3, he began to have some stools.  He was given one more unit of blood due to the decrease in his hematocrit.  His Foley was maintained in place.  He was not maintained on pharmacologic prophylaxis for DVT due to the fact that he was bleeding into his pelvis due to the adhesions that he had.  On postoperative day #4, he was febrile to 102.6.  His white count remained elevated to 17.6.  He had a chest x-ray which showed possible bilateral infiltrates in his upper lobes.  He did clinically appear to have a pneumonia, both by his exam as well as when he was coughing up at that point as well.  His creatinine returned to normal.  His urine output was adequate at that point.  Postoperative day #5, he was begun  on clear liquids and which he was tolerating well, and he was passing stools as well as flatus.  He was on his antibiotics for hospital-acquired pneumonia, which were switched to Zosyn and Flagyl. By postoperative day #6, he continued to have a fair amount of loose stools more than I would expect is from his operation.  I checked his Clostridium difficile and this end up being positive as well.  The remainder of his blood cultures and urine cultures were all negative. Over the next several days, his drainage from his JP just became old blood.  He did receive one more unit of blood just for symptomatic anemia, but his blood count has remained stable since then.  He has been maintained on vancomycin for treatment of his C. diff.  I shortened his course for hospital-acquired pneumonia and gave him 7  days of Zosyn as the treatment for that and he is doing much better from that standpoint. Upon the day of discharge, he is ambulating, tolerating a regular diet, having bowel movements, has returned more to normal at this point, and his wound has a mild amount of serous drainage, but he remains clear without any evidence of infection.  His JP has small blood in it.  I am going to discharge him home with a follow up very shortly to see him again.  MEDICATIONS UPON DISCHARGE: 1. Atenolol 25 mg p.o. daily. 2. Ensure t.i.d. 3. Flagyl 5 mg q.i.d. 4. Oxycodone as needed. 5. Florastor daily. 6. Multivitamin daily.  FOLLOWUP:  His followup is going to be with [facility] with Dr. Dwain Sarna in 1 week and I am going to call him with that appointment.  DRESSING INSTRUCTIONS:  To change it as needed and the JP is going to remain in place.  PERTINENT LABORATORY EVALUATION:  On discharge, his creatinine is 1.07, potassium 3.4.  His last CBC showed a white count was 13, hematocrit 27.5, and platelets of 566.  PERTINENT RADIOLOGIC EVALUATION:  On March 12, showed new bilateral pulmonary infiltrates present.     Juanetta Gosling, MD     MCW/MEDQ  D:  12/07/2010  T:  12/07/2010  Job:  [id number]  Electronically Signed by Emelia Loron MD on 12/09/2010 11:16:17 AM

## 2010-12-10 ENCOUNTER — Other Ambulatory Visit (HOSPITAL_COMMUNITY): Payer: Self-pay | Admitting: Urology

## 2010-12-10 DIAGNOSIS — Z960 Presence of urogenital implants: Secondary | ICD-10-CM

## 2011-01-17 ENCOUNTER — Encounter (INDEPENDENT_AMBULATORY_CARE_PROVIDER_SITE_OTHER): Payer: Self-pay | Admitting: General Surgery

## 2011-02-04 NOTE — Discharge Summary (Signed)
[place]. [facility]  Patient:    [name], [name]                    MRN: [id number] Adm. Date:  03/16/00 Disc. Date: 03/18/00 Attending:  Fayrene Fearing L. Randa Evens, M.D.                           Discharge Summary  ADMISSION DIAGNOSIS:  Acute diverticulitis.  DISCHARGE DIAGNOSIS:  Acute diverticulitis.  HISTORY:  A 50 year old male who presented to the emergency room with temperature in excess of 101 degrees with constipation and left lower quadrant abdominal pain.  He had a history of diverticulosis with bleeding in 1996.  He had been on a high-fiber diet and has done reasonably well since then.  CURRENT MEDICATIONS:  Flonase.  PAST SURGICAL HISTORY:  A right inguinal hernia repair.  No other previous surgeries.  PHYSICAL EXAMINATION:  VITAL SIGNS:  The patient is afebrile.  Vital signs normal.  GENERAL:  An obese white male in no acute distress.  HEENT:  Eyes:  Sclerae non-icteric.  HEART:  Regular rate and rhythm without murmurs or gallops.  ABDOMEN:  Quite tender in the left lower quadrant to deep palpation.  For more details, please see dictated admission history and physical.  HOSPITAL COURSE:  The patient was admitted to the medical floor and placed on IV Levaquin and Flagyl.  His initial labs revealed a white count of 20.1. Labs two days later revealed a white count that dropped to 8.7.  By the day following his admission, he was feeling much better after 24 hours of antibiotics and was switched to oral antibiotics.  The following morning, he was tolerating a diet and his pain was essentially gone.  He was felt to be in satisfactory condition for discharge.  DISCHARGE MEDICATIONS: 1. Flonase p.r.n. 2. Cipro 500 mg b.i.d. 3. Flagyl 500 mg t.i.d. 4. Miralax one glass daily.  DISCHARGE INSTRUCTIONS:  He has been instructed on a high-fiber diet.  He has also been instructed to increase this to high fiber after three days.  FOLLOW-UP:  He  will see Dr. Randa Evens in the office in three to four weeks and to call if worse. DD:  03/18/00 TD:  03/18/00 Job: [id number]

## 2011-02-04 NOTE — H&P (Signed)
[place]. [facility]  Patient:    [name], [name]                  MRN: [id number] Adm. Date:  [id number] Attending:  Orland Mustard Dictator:   Roosvelt Harps, M.D. CC:         Llana Aliment. Randa Evens, M.D.                         History and Physical  HISTORY OF PRESENT ILLNESS:  Mr. Eller is a 50 year old male who presented to the emergency room complaining of fevers in excess of 101 associated with constipation and left lower quadrant burning abdominal pain.  He has a prior history of diverticulosis in 1996, and presented with an acute bleed. Colonoscopy at that time demonstrated moderate diverticulosis without any neoplasia.  He has essentially been well in the interim.  He denies dysphagia, nausea, vomiting, diarrhea, melena, hematochezia, or weight loss.  PAST MEDICAL HISTORY: 1. Right inguinal hernia repair. 2. Chronic sinusitis.  CURRENT MEDICATIONS:  Flonase.  ALLERGIES:  The patient reports that he had tachycardia in response to be given PREDNISONE in the past, but has no other clear allergies.  FAMILY HISTORY:  His father has had diverticulitis and his mother has had gallstones.  There is no family history of known gastrointestinal neoplasia or inflammatory bowel disease.  SOCIAL HISTORY:  He works in a Programme researcher, broadcasting/film/video.  He is a minimal social drinker and a nonsmoker for several years.  REVIEW OF SYSTEMS:  GENERAL:  No weight loss or night sweats.  ENDOCRINE:  No known history of diabetes or thyroid problems.  SKIN:  No rash or pruritis.  HEENT:  No icterus or change in vision.  No aphthous ulcers or chronic sore throat.  RESPIRATORY:  No shortness of breath, cough, or wheezing.  CARDIAC:  No chest pain, palpitations, or history of valvular heart disease.  GASTROINTESTINAL:  As above.  GASTROURINARY:  No dysuria or hematuria.  The remainder of the review of systems is negative.  PHYSICAL EXAMINATION:  GENERAL:  He  is an obese adult male in no acute distress.  VITAL SIGNS:  Admission temperature was 101.3, current temperature is 99.1, blood pressure 99/68.  SKIN:  Normal.  HEENT:  Anicteric.  Oropharynx is unremarkable.  NECK:  Supple without thyromegaly.  There is no cervical or inguinal adenopathy.  CHEST:  Sounds are clear.  HEART:  Sounds are regular rate and rhythm without murmurs, rubs, or gallops.  ABDOMEN:  Obese and soft with positive bowel sounds and without mass or organomegaly.  There is minimal left lower quadrant tenderness to deep palpation.  RECTAL:  Not performed.  EXTREMITIES:  Without clubbing, cyanosis, or edema.  Dorsalis pedis pulses are 2+ bilaterally.  LABORATORY DATA:  Hemoglobin 15.1, white blood cell count 20,100, 76% polys. Electrolytes, liver function tests, lipase, and urinalysis are negative or normal.  CT scan demonstrates a fatty liver with a 1.5 cm right lobe hypodense lesion, possibly an adenoma.  In addition, there is sigmoid mesenteric stranding, and sigmoid inflammation consistent with diverticulitis.  IMPRESSION:  Acute sigmoid diverticulitis in an obese, but otherwise fairly healthy gentleman.  PLAN:  Intravenous antibiotics consisting of Flagyl and Levaquin as per orders.  The patient will be kept on a clear liquid diet until his bowel function returns.  He will also be given stool softener and miralax. DD:  03/16/00 TD:  03/16/00 Job: [id number]

## 2011-02-04 NOTE — Assessment & Plan Note (Signed)
[facility]                           [place] OFFICE NOTE   NAME:[name], [name]                  MRN:          [id number]  DATE:11/28/2006                            DOB:          Jun 22, 1961    CHIEF COMPLAINT:  A 50 year old white male here to establish new doctor.   HISTORY OF PRESENT ILLNESS:  Patient previously had his doctor at [facility] until his practice closed.  He comes into clinic today with the  following issues:  1. Hypertension, poorly controlled:  He states he has been off      hydrochlorothiazide for approximately a month because of not being      able to get refills without a primary doctor.  He states that for      DOT physical he needs his blood pressure under excellent control.      He is currently working on weight loss by eating habits and herbal      supplements listed in medication list.  He does not currently get      any exercise, but will increase to walking daily once it is warm.  2. Right sinus tenderness, acute.  He states that last week he had an      episode where his right frontal sinus and right eye were tender and      swollen respectively.  He has a history of allergies, and has      previously been on Flonase, and had problems with recurrent      sinusitis, as well as fluid in his ears that required an ear tube 2      years ago.  He reports that his symptoms of sinus pain are better      now.  He does still have a little bit of congestion and slight      pressure on his right side.  He is not currently using any      medication such as allergy medication, Flonase, or nasal saline.      He has occasionally had some dizziness over the day today.   REVIEW OF SYSTEMS:  Otherwise negative.   PAST MEDICAL HISTORY:  1. Hypertension.  2. Allergic rhinitis, seasonal.  3. In the 1980s, heart arrhythmia following multiple medications,      possibly paroxysmal atrial fibrillation versus SVT, patient  treated      with digoxin temporarily, but was told that he could stop by a      cardiologist.  4. In the 1990s, diverticulitis, GI bleed.   HOSPITALIZATIONS/SURGERIES/PROCEDURES:  1. In 2006, ear tubes.  2. In 1980s, hospitalized for arrhythmia.  3. Tonsillectomy.  4. Inguinal hernia operation.  5. Colonoscopy in the 1990s.   Allergy to SUDAFED causing palpitations.   MEDICATIONS:  1. Hydrochlorothiazide 12.5 mg daily.  2. Multivitamin daily.  3. L-carnitine daily.  4. Potassium daily.  5. B12 daily.  6. Flaxseed oil daily.  7. Omega-3 fish oil daily.  8. Vitamin C daily.  9. Chromium picolinate daily.  10.Co-Q10 daily.  11.L-taurine daily.  12.L-arginine daily.   SOCIAL HISTORY:  Occasional cigar use.  Drinks 1-2 beers on weekends.  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  Also drives the company truck.  He has been married for 25 years.  He  has 1 child who is healthy.  He is not currently getting any regular  exercise.  As far as his diet, he occasionally eats fruits and  vegetables, frequently has baked chicken.  He avoids salt and rarely has  soda.  He frequently drinks lots of water, and he avoids fast food.   FAMILY HISTORY:  Father alive at age 77 with an MI around age 27.  Mother alive at age 55, healthy.  He has 1 brother with hypertension.  No family history of diabetes.  He has a paternal grandmother with lung  cancer and a paternal grandfather with possible cancer, but unknown  type.   PHYSICAL EXAMINATION:  VITAL SIGNS:  Height 66 inches, weight 208,  making BMI 33.  Pulse 84, temperature 99.  GENERAL:  Healthy-appearing male in no apparent distress.  HEENT:  PERRLA.  Extraocular muscles intact.  Oropharynx clear.  Tympanic membranes with chronic scarring, no current exudate or  erythema.  There is an old ear tube in the external ear canal stuck in  wax on the right.  Nontender to palpation over bilateral, frontal, and   maxillary sinuses.  No thyromegaly, no lymphadenopathy.  Turbinates and  nares slightly pale.  CARDIOVASCULAR:  Regular rate and rhythm, no murmurs, rubs, or gallops.  Normal PMI.  2+ peripheral pulses.  PULMONARY:  Clear to auscultation bilaterally.  No wheezes, rales, or  rhonchi.  ABDOMEN:  Soft, nontender, normoactive bowel sounds, no  hepatosplenomegaly.  MUSCULOSKELETAL:  Strength 5/5 in upper and lower extremities.  NEURO:  Cranial nerves II-XII grossly intact.  Reflexes, patellar 2+  bilateral.   ASSESSMENT AND PLAN:  1. Hypertension, moderate control.  Restart hydrochlorothiazide 12.5      mg daily.  Prescription was sent to [facility] as well as a prescription      to [facility] until [facility] sends medications.  He was instructed to follow      his blood pressure at home with the blood pressure cuff.  He will      let me know if it does not seem to be at goal.  2. Allergic rhinitis, chronic.  He did have some tenderness over his      right frontal sinus last week, as well as some occasional symptoms      of allergies, as well as some continued congestion that suggests      poorly controlled allergies.  At this point in time, I do not feel      he has a bacterial sinusitis.  We will begin by having him do nasal      saline 3-4 times daily, along with Claritin daily.  If he has no      relief, we can consider a nasal steroid, or possible treatments      with antibiotics if he develops a fever.  3. Prevention.  I will obtain records from his previous doctor.  It      sounds as though he has not had a cholesterol checked in many      years, and I will obtain this along with a CMET.  He will also go      to the cancer center to have a PSA done for free, if it is      available.  If  not, he will contact me and we can add this to the      labs.  He was encouraged to continue healthy eating habits, and to      begin exercise program.     Kerby Nora, MD  Electronically Signed    AB/MedQ   DD: 11/28/2006  DT: 11/30/2006  Job #: [id number]

## 2011-09-20 DIAGNOSIS — I4891 Unspecified atrial fibrillation: Secondary | ICD-10-CM

## 2011-09-20 HISTORY — DX: Unspecified atrial fibrillation: I48.91

## 2013-08-18 ENCOUNTER — Ambulatory Visit: Payer: BC Managed Care – PPO | Admitting: Physician Assistant

## 2013-08-18 VITALS — BP 142/90 | HR 115 | Temp 97.8°F | Resp 18 | Ht 64.5 in | Wt 209.0 lb

## 2013-08-18 DIAGNOSIS — J069 Acute upper respiratory infection, unspecified: Secondary | ICD-10-CM

## 2013-08-18 MED ORDER — IPRATROPIUM BROMIDE 0.06 % NA SOLN: 2.0000 | Freq: Three times a day (TID) | NASAL | Status: DC

## 2013-08-18 MED ORDER — GUAIFENESIN ER 1200 MG PO TB12: 1.0000 | ORAL_TABLET | Freq: Two times a day (BID) | ORAL | Status: AC

## 2013-08-18 NOTE — Patient Instructions (Signed)
Push fluids.  Tylenol/motrin for body aching and feeling poorly.

## 2013-08-18 NOTE — Progress Notes (Signed)
   Subjective:    Patient ID: [name], male    DOB: 06/08/1961, 52 y.o.   MRN: [id number]  HPI Pt presents to clinic with 4 day h/o cold symptoms.  Symptoms started after he was exposed to smoke from something burning outside of his work place so he thought that it was allergies but then he took Claritin and it did not help and then he started to feel worse.  He has developed green rhinorrhea with significant congestion and PND.  He has a rare cough but is clearing his throat of green mucus.  He has some myalgias.  He works at [employer].  OTC meds - Claritin at beginning of the illness, motrin - not helping much Works with the public  Review of Systems  Constitutional: Positive for fever (subjective). Negative for chills.  HENT: Positive for congestion, ear pain (right ear), postnasal drip, rhinorrhea (green), sinus pressure (on the right frontal sinus area) and sore throat (intermittent worse in the am and right before bed).   Respiratory: Positive for cough.   Musculoskeletal: Positive for myalgias.  Neurological: Positive for headaches.       Objective:   Physical Exam  Vitals reviewed. Constitutional: He appears well-developed and well-nourished.  HENT:  Head: Normocephalic and atraumatic.  Right Ear: Hearing, tympanic membrane, external ear and ear canal normal.  Left Ear: Hearing, tympanic membrane, external ear and ear canal normal.  Nose: Mucosal edema (red and turbinates touching septum) present. Right sinus exhibits no maxillary sinus tenderness and no frontal sinus tenderness. Left sinus exhibits no maxillary sinus tenderness and no frontal sinus tenderness.  Mouth/Throat: Uvula is midline, oropharynx is clear and moist and mucous membranes are normal.  Cardiovascular: Normal rate, regular rhythm and normal heart sounds.   No murmur heard. Pulmonary/Chest: Effort normal and breath sounds normal. He has no wheezes.       Assessment & Plan:  URI (upper  respiratory infection) - Plan: Guaifenesin (MUCINEX MAXIMUM STRENGTH) 1200 MG TB12, ipratropium (ATROVENT) 0.06 % nasal spray  If patient is not improved in 7 days he will call for an abx.  Benny Lennert PA-C 08/18/2013 11:06 AM

## 2013-08-21 ENCOUNTER — Telehealth: Payer: Self-pay

## 2013-08-21 NOTE — Telephone Encounter (Signed)
Patient was in to see sarah w , he would like to know if he can get some amoxicillian call in to [facility] if possible please call him at [phone] with questions

## 2013-08-22 MED ORDER — DOXYCYCLINE HYCLATE 100 MG PO TABS: 100.0000 mg | ORAL_TABLET | Freq: Two times a day (BID) | ORAL | Status: DC

## 2013-08-22 NOTE — Telephone Encounter (Signed)
I have an abx to the pharmacy.

## 2013-08-22 NOTE — Telephone Encounter (Signed)
Patient is calling to let [name] know that he is not feeling any better.  Patient stated that we would call in an antibiotic for him to the pharmacy.   Best#: [phone]   Pharmacy: [facility]

## 2013-08-23 NOTE — Telephone Encounter (Signed)
Patient was called and advised that abx was sent to pharmacy. He stated that he picked it up last night.

## 2013-10-13 ENCOUNTER — Ambulatory Visit: Payer: BC Managed Care – PPO | Admitting: Internal Medicine

## 2013-10-13 VITALS — BP 130/80 | HR 111 | Temp 98.2°F | Resp 16 | Ht 63.75 in | Wt 205.0 lb

## 2013-10-13 DIAGNOSIS — F17200 Nicotine dependence, unspecified, uncomplicated: Secondary | ICD-10-CM | POA: Insufficient documentation

## 2013-10-13 DIAGNOSIS — J019 Acute sinusitis, unspecified: Secondary | ICD-10-CM

## 2013-10-13 DIAGNOSIS — J069 Acute upper respiratory infection, unspecified: Secondary | ICD-10-CM

## 2013-10-13 MED ORDER — IPRATROPIUM BROMIDE 0.06 % NA SOLN: 2.0000 | Freq: Three times a day (TID) | NASAL | Status: DC

## 2013-10-13 MED ORDER — HYDROCODONE-HOMATROPINE 5-1.5 MG/5ML PO SYRP: 5.0000 mL | ORAL_SOLUTION | Freq: Four times a day (QID) | ORAL | Status: DC | PRN

## 2013-10-13 MED ORDER — AMOXICILLIN 500 MG PO CAPS: 1000.0000 mg | ORAL_CAPSULE | Freq: Two times a day (BID) | ORAL | Status: AC

## 2013-10-13 NOTE — Progress Notes (Signed)
Subjective:    Patient ID: [name], male    DOB: 12-02-1960, 53 y.o.   MRN: [id number]  HPI This chart was scribed for [name] Sia, MD by Andrew Au, ED Scribe. This patient was seen in room 3 and the patient's care was started at 3:18 PM.  HPI Comments: [name] [name] is a 53 y.o. male who presents to the [facility] complaining of worsening sinus problems onset 2 weeks. He reports his symptoms started with a tickle in his throat and rhinorrhea. He reports that his nasal drianage went from clear to green in about 4 days ago. He has an associated subjective fever, itchy ears and minor ear pain, chills, fever at night, and a productive cough. Pt also reports feeling hot and cold at times. He reports he has been using saline nasal spray twice a day for his congestion. He states that he began mucinex about 5 days ago for his cough and nasal congestion. Pt reports that the cough keeps him awake at night and that it has caused abdominal soreness. He denies SOB and sore throat. Pt reports he is a smoker and smokes about 4-6 cigarettes a day. Pt reports that he has received the flu shot this year. Pt reports that he has hernias in his stomach from a surgery in 2011. Pt reports he is not allergic to antibiotics. Pt reports that he takes vitamins.  His father is also being seen today at the facility.  Past Medical History  Diagnosis Date  . Diverticulitis     ACTIVE WITH ABCESS   Allergies  Allergen Reactions  . Pseudoephedrine     REACTION: Elevated HR   Prior to Admission medications   Medication Sig Start Date End Date Taking? Authorizing Provider  ibuprofen (ADVIL) 200 MG tablet Take 200 mg by mouth every 6 (six) hours as needed.   Yes Historical Provider, MD  Multiple Vitamins-Minerals (MULTIVITAL PO) Take by mouth daily. MENS MEGA MULTIVIT.    Yes Historical Provider, MD  ipratropium (ATROVENT) 0.06 % nasal spray Place 2 sprays into the nose 3  (three) times daily. 08/18/13   Morrell Riddle, PA-C    Review of Systems  Constitutional: Positive for fever and chills.  HENT: Positive for congestion, ear pain, rhinorrhea and sinus pressure. Negative for sore throat.   Respiratory: Positive for cough. Negative for shortness of breath.   Gastrointestinal: Positive for abdominal pain.       Objective:   Physical Exam Filed Vitals:   10/13/13 1513  BP: 130/80  Pulse: 111  Temp: 98.2 F (36.8 C)  Resp: 16   Meds ordered this encounter  Medications  . amoxicillin (AMOXIL) 500 MG capsule    Sig: Take 2 capsules (1,000 mg total) by mouth 2 (two) times daily.    Dispense:  40 capsule    Refill:  0  . ipratropium (ATROVENT) 0.06 % nasal spray    Sig: Place 2 sprays into the nose 3 (three) times daily.    Dispense:  15 mL    Refill:  0    Order Specific Question:  Supervising Provider    Answer:  [name] P [[id number]]      Assessment & Plan:  I have completed the patient encounter in its entirety as documented by the scribe, with editing by me where necessary. Joselynn Amoroso P. Merla Riches, M.D. Smoker  URI (upper respiratory infection)=acute sinusitis w/ cough  Meds ordered this encounter  Medications  . amoxicillin (  AMOXIL) 500 MG capsule    Sig: Take 2 capsules (1,000 mg total) by mouth 2 (two) times daily.    Dispense:  40 capsule    Refill:  0  . ipratropium (ATROVENT) 0.06 % nasal spray    Sig: Place 2 sprays into the nose 3 (three) times daily.    Dispense:  15 mL    Refill:  0    Order Specific Question:  Supervising Provider    Answer:  Lugene Hitt P [[id number]]  . HYDROcodone-homatropine (HYCODAN) 5-1.5 MG/5ML syrup    Sig: Take 5 mLs by mouth every 6 (six) hours as needed for cough.    Dispense:  120 mL    Refill:  0

## 2015-06-25 ENCOUNTER — Emergency Department (HOSPITAL_COMMUNITY): Payer: BLUE CROSS/BLUE SHIELD

## 2015-06-25 ENCOUNTER — Other Ambulatory Visit: Payer: Self-pay | Admitting: Family Medicine

## 2015-06-25 ENCOUNTER — Encounter (HOSPITAL_COMMUNITY): Payer: Self-pay | Admitting: *Deleted

## 2015-06-25 ENCOUNTER — Inpatient Hospital Stay (HOSPITAL_COMMUNITY)
Admission: EM | Admit: 2015-06-25 | Discharge: 2015-07-03 | DRG: 286 | Disposition: A | Discharge status: Home / Self Care | Payer: BLUE CROSS/BLUE SHIELD | Attending: Internal Medicine | Admitting: Internal Medicine

## 2015-06-25 ENCOUNTER — Ambulatory Visit
Admission: RE | Admit: 2015-06-25 | Discharge: 2015-06-25 | Disposition: A | Discharge status: Home / Self Care | Payer: BLUE CROSS/BLUE SHIELD | Source: Ambulatory Visit | Attending: Family Medicine | Admitting: Family Medicine

## 2015-06-25 DIAGNOSIS — I10 Essential (primary) hypertension: Secondary | ICD-10-CM | POA: Diagnosis present

## 2015-06-25 DIAGNOSIS — Z8249 Family history of ischemic heart disease and other diseases of the circulatory system: Secondary | ICD-10-CM

## 2015-06-25 DIAGNOSIS — I110 Hypertensive heart disease with heart failure: Secondary | ICD-10-CM | POA: Diagnosis not present

## 2015-06-25 DIAGNOSIS — R000 Tachycardia, unspecified: Secondary | ICD-10-CM | POA: Diagnosis not present

## 2015-06-25 DIAGNOSIS — R7989 Other specified abnormal findings of blood chemistry: Secondary | ICD-10-CM | POA: Diagnosis not present

## 2015-06-25 DIAGNOSIS — F17200 Nicotine dependence, unspecified, uncomplicated: Secondary | ICD-10-CM

## 2015-06-25 DIAGNOSIS — F17210 Nicotine dependence, cigarettes, uncomplicated: Secondary | ICD-10-CM | POA: Diagnosis not present

## 2015-06-25 DIAGNOSIS — I2510 Atherosclerotic heart disease of native coronary artery without angina pectoris: Secondary | ICD-10-CM | POA: Diagnosis present

## 2015-06-25 DIAGNOSIS — I5021 Acute systolic (congestive) heart failure: Secondary | ICD-10-CM | POA: Diagnosis present

## 2015-06-25 DIAGNOSIS — R0981 Nasal congestion: Secondary | ICD-10-CM | POA: Diagnosis present

## 2015-06-25 DIAGNOSIS — R570 Cardiogenic shock: Secondary | ICD-10-CM | POA: Diagnosis present

## 2015-06-25 DIAGNOSIS — I472 Ventricular tachycardia: Secondary | ICD-10-CM | POA: Diagnosis not present

## 2015-06-25 DIAGNOSIS — Z7951 Long term (current) use of inhaled steroids: Secondary | ICD-10-CM

## 2015-06-25 DIAGNOSIS — R0600 Dyspnea, unspecified: Secondary | ICD-10-CM | POA: Diagnosis present

## 2015-06-25 DIAGNOSIS — I428 Other cardiomyopathies: Secondary | ICD-10-CM | POA: Diagnosis present

## 2015-06-25 DIAGNOSIS — E785 Hyperlipidemia, unspecified: Secondary | ICD-10-CM | POA: Diagnosis present

## 2015-06-25 DIAGNOSIS — R0602 Shortness of breath: Secondary | ICD-10-CM | POA: Diagnosis not present

## 2015-06-25 DIAGNOSIS — Z6835 Body mass index (BMI) 35.0-35.9, adult: Secondary | ICD-10-CM

## 2015-06-25 DIAGNOSIS — R221 Localized swelling, mass and lump, neck: Secondary | ICD-10-CM

## 2015-06-25 DIAGNOSIS — E669 Obesity, unspecified: Secondary | ICD-10-CM

## 2015-06-25 DIAGNOSIS — I4891 Unspecified atrial fibrillation: Secondary | ICD-10-CM | POA: Diagnosis not present

## 2015-06-25 DIAGNOSIS — Z888 Allergy status to other drugs, medicaments and biological substances status: Secondary | ICD-10-CM

## 2015-06-25 DIAGNOSIS — R778 Other specified abnormalities of plasma proteins: Secondary | ICD-10-CM

## 2015-06-25 LAB — BASIC METABOLIC PANEL
Anion gap: 10 (ref 5–15)
BUN: 15 mg/dL (ref 6–20)
CHLORIDE: 106 mmol/L (ref 101–111)
CO2: 19 mmol/L — ABNORMAL LOW (ref 22–32)
Calcium: 8.7 mg/dL — ABNORMAL LOW (ref 8.9–10.3)
Creatinine, Ser: 1.23 mg/dL (ref 0.61–1.24)
GFR calc Af Amer: >60 mL/min (ref >60)
GLUCOSE: 96 mg/dL (ref 65–99)
POTASSIUM: 4.2 mmol/L (ref 3.5–5.1)
Sodium: 135 mmol/L (ref 135–145)

## 2015-06-25 LAB — CBC
HEMATOCRIT: 46.2 % (ref 39.0–52.0)
Hemoglobin: 15.6 g/dL (ref 13.0–17.0)
MCH: 31.7 pg (ref 26.0–34.0)
MCHC: 33.8 g/dL (ref 30.0–36.0)
MCV: 93.9 fL (ref 78.0–100.0)
Platelets: 241 10*3/uL (ref 150–400)
RBC: 4.92 MIL/uL (ref 4.22–5.81)
RDW: 14.2 % (ref 11.5–15.5)
WBC: 10.7 10*3/uL — ABNORMAL HIGH (ref 4.0–10.5)

## 2015-06-25 LAB — I-STAT TROPONIN, ED: Troponin i, poc: 0.07 ng/mL (ref 0.00–0.08)

## 2015-06-25 LAB — TROPONIN I: Troponin I: 0.09 ng/mL — ABNORMAL HIGH (ref <0.031)

## 2015-06-25 MED ORDER — IOHEXOL 350 MG/ML SOLN
100.0000 mL | Freq: Once | INTRAVENOUS | Status: AC | PRN
  Administered 2015-06-25: 100 mL via INTRAVENOUS

## 2015-06-25 NOTE — ED Notes (Signed)
Pt sent here from MD office with elevated troponin of 0.12 and BNP 583.  Pt reports sob and is supposed to have a know on right neck.  Pt is alert and talking.

## 2015-06-25 NOTE — H&P (Signed)
[facility] History and Physical  [name] GQB:[id number] DOB: [birth year] DOA: 06/25/2015  Referring physician: Mirian Mo, MD PCP: No primary care provider on file.   Chief Complaint: Shortness of breath  HPI: [name] is a 54 y.o. male with history of HTN HLD presents with shortness of breath. Patient states that for the last 2 months he has been having shortness of breath. Patient states that initially he was noticing difficulty when he lay down. He states that he felt suffocating when he would lay down. Later on he states that he was short of breath with walking. He states he has never had any chest pain. No left arm pain and no shoulder pain. He also denies having any back pain., he has a history of HTN but is not on any treatment at this time. He also state he has diverticulitis which is inactive. He went to his PCP Dr Modesto Charon and had some blood work done which reveals an elevated troponin. In the ED here he was noted to be tachycardic and also his troponin was normal. A CT scan was done and this was negative for PE. Patient does smoke also   Review of Systems:  Complete 12 point ROS is unremarkable other than HPI.   Past Medical History  Diagnosis Date  . Diverticulitis     ACTIVE WITH ABCESS   Past Surgical History  Procedure Laterality Date  . Tonsillectomy  54 YEARS OLD  . Hernia repair  79 OR 54 YEARS OLD  . Colon surgery     Social History:  reports that he has been smoking.  He does not have any smokeless tobacco history on file. He reports that he drinks alcohol. His drug history is not on file.  Allergies  Allergen Reactions  . Pseudoephedrine     REACTION: Elevated HR    Family History  Problem Relation Age of Onset  . Hypertension Mother      Prior to Admission medications   Medication Sig Start Date End Date Taking? Authorizing Provider  fluticasone (FLONASE) 50 MCG/ACT nasal spray Place 1 spray into both nostrils daily.   Yes  Historical Provider, MD  Multiple Vitamins-Minerals (MULTIVITAL PO) Take by mouth daily. MENS MEGA MULTIVIT.    Yes Historical Provider, MD  vitamin B-12 (CYANOCOBALAMIN) 500 MCG tablet Take 500 mcg by mouth daily.   Yes Historical Provider, MD  HYDROcodone-homatropine (HYCODAN) 5-1.5 MG/5ML syrup Take 5 mLs by mouth every 6 (six) hours as needed for cough. Patient not taking: Reported on 06/25/2015 10/13/13   Tonye Pearson, MD  ipratropium (ATROVENT) 0.06 % nasal spray Place 2 sprays into the nose 3 (three) times daily. Patient not taking: Reported on 06/25/2015 10/13/13   Tonye Pearson, MD   Physical Exam: Filed Vitals:   06/25/15 1851 06/25/15 1905  BP: 124/98   Pulse: 121   Temp: 97.5 F (36.4 C)   TempSrc: Oral   Resp: 22   SpO2: 96% 98%    Wt Readings from Last 3 Encounters:  10/13/13 92.987 kg (205 lb)  08/18/13 94.802 kg (209 lb)  11/28/06 94.711 kg (208 lb 12.8 oz)    General:  Appears calm and comfortable Eyes: PERRL, normal lids, irises & conjunctiva ENT: grossly normal hearing, lips & tongue Neck: no LAD, masses or thyromegaly Cardiovascular: Tachy S1S2 otherwise normal no m/r/g. No LE edema Respiratory: CTA bilaterally, no w/r/r. Normal respiratory effort. Abdomen: soft, ntnd Skin: no rash or induration seen on limited exam Musculoskeletal:  grossly normal tone BUE/BLE Psychiatric: grossly normal mood and affect Neurologic: grossly non-focal.          Labs on Admission:  Basic Metabolic Panel:  Recent Labs Lab 06/25/15 1918  NA 135  K 4.2  CL 106  CO2 19*  GLUCOSE 96  BUN 15  CREATININE 1.23  CALCIUM 8.7*   Liver Function Tests: No results for input(s): AST, ALT, ALKPHOS, BILITOT, PROT, ALBUMIN in the last 168 hours. No results for input(s): LIPASE, AMYLASE in the last 168 hours. No results for input(s): AMMONIA in the last 168 hours. CBC:  Recent Labs Lab 06/25/15 1918  WBC 10.7*  HGB 15.6  HCT 46.2  MCV 93.9  PLT 241   Cardiac  Enzymes: No results for input(s): CKTOTAL, CKMB, CKMBINDEX, TROPONINI in the last 168 hours.  BNP (last 3 results) No results for input(s): BNP in the last 8760 hours.  ProBNP (last 3 results) No results for input(s): PROBNP in the last 8760 hours.  CBG: No results for input(s): GLUCAP in the last 168 hours.  Radiological Exams on Admission: Dg Chest 2 View  06/25/2015   CLINICAL DATA:  Cough and congestion for 2 months, increasing shortness of breath, smoking history  EXAM: CHEST  2 VIEW  COMPARISON:  Portable chest x-ray of 11/29/2010 and two-view chest x-ray of 11/17/2010  FINDINGS: There are somewhat prominent interstitial markings with a tiny amount of fluid in the fissures on the lateral view. With cardiomegaly, this may indicate very early interstitial edema. A tiny pleural effusion cannot be excluded with some blunting of the costophrenic angle on the lateral view. No focal infiltrate is seen. No bony abnormality is noted.  IMPRESSION: Cardiomegaly and somewhat prominent interstitial markings may represent mild interstitial edema.   Electronically Signed   By: Dwyane Dee M.D.   On: 06/25/2015 16:14   Ct Angio Chest Pe W/cm &/or Wo Cm  06/25/2015   CLINICAL DATA:  Short of breath for 2 months  EXAM: CT ANGIOGRAPHY CHEST WITH CONTRAST  TECHNIQUE: Multidetector CT imaging of the chest was performed using the standard protocol during bolus administration of intravenous contrast. Multiplanar CT image reconstructions and MIPs were obtained to evaluate the vascular anatomy.  CONTRAST:  OMNIPAQUE IOHEXOL 350 MG/ML SOLN  COMPARISON:  None.  FINDINGS: There are no filling defects in the pulmonary arterial tree to suggest acute pulmonary thromboembolism.  Left ventricular hypertrophy. There is some enlargement of the left atrium.  Abnormal mediastinal adenopathy. 18 mm short axis diameter precarinal node. 11 mm paratracheal node on image 29. Other scattered smaller mediastinal nodes.  No  pneumothorax.  No pleural effusion.  Clear lungs.  No vertebral compression deformity.  There is contrast reflux into the hepatic veins suggesting elevated right heart pressure.  Review of the MIP images confirms the above findings.  IMPRESSION: No evidence of acute pulmonary thromboembolism  There is abnormal mediastinal adenopathy. A lymphoproliferative disorder is not excluded. Correlate clinically as for the need for further imaging or three-month follow-up CT.   Electronically Signed   By: Jolaine Click M.D.   On: 06/25/2015 22:42      Assessment/Plan Principal Problem:   Tachycardia Active Problems:   Essential hypertension   Elevated troponin   Hyperlipidemia   Cigarette nicotine dependence, uncomplicated   1. Tachycardia -unclear etiology -will get echo in am -will check TSH -monitor on telemetry overnight -will get a lactate also  2. Elevated Troponin -now is normal -will cycle enzymes overnight -ED spoke  with Dr Tresa Endo and they will follow suggested getting an echo  3. HTN -patient is not on any medications -will monitor pressures consider beta blockers if treatment indicated  4. Hyperlipidemia -not on any treatment -will check lipid panel  5. Ongoing Smoking -smoking cessation counseling      Code Status: full code (must indicate code status--if unknown or must be presumed, indicate so) DVT Prophylaxis:heparin Family Communication: none (indicate person spoken with, if applicable, with phone number if by telephone) Disposition Plan: home (indicate anticipated LOS)    [facility] Pager [phone]

## 2015-06-25 NOTE — ED Provider Notes (Signed)
CSN: [id number]     Arrival date & time 06/25/15  1832 History   First MD Initiated Contact with Patient 06/25/15 1905     Chief Complaint  Patient presents with  . Shortness of Breath    Elevated troponin     (Consider location/radiation/quality/duration/timing/severity/associated sxs/prior Treatment) Patient is a 54 y.o. male presenting with shortness of breath.  Shortness of Breath Severity:  Moderate Onset quality:  Gradual Duration:  2 months Timing:  Constant Progression:  Unchanged Chronicity:  New Context: activity   Relieved by:  Nothing Worsened by:  Movement and exertion Associated symptoms: no abdominal pain, no cough, no ear pain and no fever     Past Medical History  Diagnosis Date  . Diverticulitis     ACTIVE WITH ABCESS   Past Surgical History  Procedure Laterality Date  . Tonsillectomy  54 YEARS OLD  . Hernia repair  62 OR 54 YEARS OLD  . Colon surgery     Family History  Problem Relation Age of Onset  . Hypertension Mother    Social History  Substance Use Topics  . Smoking status: Current Every Day Smoker  . Smokeless tobacco: None  . Alcohol Use: Yes     Comment: weekly    Review of Systems  Constitutional: Negative for fever.  HENT: Negative for ear pain.   Respiratory: Positive for shortness of breath. Negative for cough.   Gastrointestinal: Negative for abdominal pain.  All other systems reviewed and are negative.     Allergies  Pseudoephedrine  Home Medications   Prior to Admission medications   Medication Sig Start Date End Date Taking? Authorizing Provider  fluticasone (FLONASE) 50 MCG/ACT nasal spray Place 1 spray into both nostrils daily.   Yes Historical Provider, MD  Multiple Vitamins-Minerals (MULTIVITAL PO) Take by mouth daily. MENS MEGA MULTIVIT.    Yes Historical Provider, MD  vitamin B-12 (CYANOCOBALAMIN) 500 MCG tablet Take 500 mcg by mouth daily.   Yes Historical Provider, MD  HYDROcodone-homatropine (HYCODAN) 5-1.5  MG/5ML syrup Take 5 mLs by mouth every 6 (six) hours as needed for cough. Patient not taking: Reported on 06/25/2015 10/13/13   Tonye Pearson, MD  ipratropium (ATROVENT) 0.06 % nasal spray Place 2 sprays into the nose 3 (three) times daily. Patient not taking: Reported on 06/25/2015 10/13/13   Tonye Pearson, MD   BP 128/85 mmHg  Pulse 108  Temp(Src) 98 F (36.7 C) (Oral)  Resp 16  Ht  (1.651 m)  Wt 215 lb 2.7 oz (97.6 kg)  BMI 35.81 kg/m2  SpO2 98% Physical Exam  Constitutional: He is oriented to person, place, and time. He appears well-developed and well-nourished.  HENT:  Head: Normocephalic and atraumatic.  Eyes: Conjunctivae and EOM are normal.  Neck: Normal range of motion. Neck supple.  Cardiovascular: Regular rhythm and normal heart sounds.  Tachycardia present.   Pulmonary/Chest: Effort normal and breath sounds normal. No respiratory distress.  Abdominal: He exhibits no distension. There is no tenderness. There is no rebound and no guarding.  Musculoskeletal: Normal range of motion.  Neurological: He is alert and oriented to person, place, and time.  Skin: Skin is warm and dry.  Vitals reviewed.   ED Course  Procedures (including critical care time) Labs Review Labs Reviewed  BASIC METABOLIC PANEL - Abnormal; Notable for the following:    CO2 19 (*)    Calcium 8.7 (*)    All other components within normal limits  CBC - Abnormal; Notable  for the following:    WBC 10.7 (*)    All other components within normal limits  TROPONIN I - Abnormal; Notable for the following:    Troponin I 0.09 (*)    All other components within normal limits  LIPID PANEL - Abnormal; Notable for the following:    Triglycerides 183 (*)    HDL 20 (*)    LDL Cholesterol 112 (*)    All other components within normal limits  TROPONIN I - Abnormal; Notable for the following:    Troponin I 0.08 (*)    All other components within normal limits  TROPONIN I - Abnormal; Notable for the  following:    Troponin I 0.08 (*)    All other components within normal limits  TROPONIN I - Abnormal; Notable for the following:    Troponin I 0.07 (*)    All other components within normal limits  CBC - Abnormal; Notable for the following:    WBC 10.8 (*)    All other components within normal limits  BRAIN NATRIURETIC PEPTIDE - Abnormal; Notable for the following:    B Natriuretic Peptide 678.7 (*)    All other components within normal limits  BASIC METABOLIC PANEL - Abnormal; Notable for the following:    Glucose, Bld 116 (*)    Calcium 8.7 (*)    All other components within normal limits  TSH  LACTIC ACID, PLASMA  CREATININE, SERUM  T4, FREE  MAGNESIUM  I-STAT TROPOININ, ED    Imaging Review Dg Chest 2 View  06/25/2015   CLINICAL DATA:  Cough and congestion for 2 months, increasing shortness of breath, smoking history  EXAM: CHEST  2 VIEW  COMPARISON:  Portable chest x-ray of 11/29/2010 and two-view chest x-ray of 11/17/2010  FINDINGS: There are somewhat prominent interstitial markings with a tiny amount of fluid in the fissures on the lateral view. With cardiomegaly, this may indicate very early interstitial edema. A tiny pleural effusion cannot be excluded with some blunting of the costophrenic angle on the lateral view. No focal infiltrate is seen. No bony abnormality is noted.  IMPRESSION: Cardiomegaly and somewhat prominent interstitial markings may represent mild interstitial edema.   Electronically Signed   By: Dwyane Dee M.D.   On: 06/25/2015 16:14   Ct Angio Chest Pe W/cm &/or Wo Cm  06/25/2015   CLINICAL DATA:  Short of breath for 2 months  EXAM: CT ANGIOGRAPHY CHEST WITH CONTRAST  TECHNIQUE: Multidetector CT imaging of the chest was performed using the standard protocol during bolus administration of intravenous contrast. Multiplanar CT image reconstructions and MIPs were obtained to evaluate the vascular anatomy.  CONTRAST:  OMNIPAQUE IOHEXOL 350 MG/ML SOLN   COMPARISON:  None.  FINDINGS: There are no filling defects in the pulmonary arterial tree to suggest acute pulmonary thromboembolism.  Left ventricular hypertrophy. There is some enlargement of the left atrium.  Abnormal mediastinal adenopathy. 18 mm short axis diameter precarinal node. 11 mm paratracheal node on image 29. Other scattered smaller mediastinal nodes.  No pneumothorax.  No pleural effusion.  Clear lungs.  No vertebral compression deformity.  There is contrast reflux into the hepatic veins suggesting elevated right heart pressure.  Review of the MIP images confirms the above findings.  IMPRESSION: No evidence of acute pulmonary thromboembolism  There is abnormal mediastinal adenopathy. A lymphoproliferative disorder is not excluded. Correlate clinically as for the need for further imaging or three-month follow-up CT.   Electronically Signed   By: Merton Border  Hoss M.D.   On: 06/25/2015 22:42   I have personally reviewed and evaluated these images and lab results as part of my medical decision-making.   EKG Interpretation   Date/Time:  Thursday June 25 2015 18:48:21 EDT Ventricular Rate:  123 PR Interval:  156 QRS Duration: 106 QT Interval:  332 QTC Calculation: 475 R Axis:   94 Text Interpretation:  Sinus tachycardia with Premature atrial complexes  Possible Left atrial enlargement Rightward axis Nonspecific ST abnormality  Abnormal QRS-T angle, consider primary T wave abnormality Abnormal ECG No  significant change since last tracing Confirmed by Mirian Mo [phone])  on 06/25/2015 7:08:12 PM      MDM   Final diagnoses:  Tachycardia  Elevated troponin    54 y.o. male with pertinent PMH of diverticulitis presents with exertional dyspnea x 2 weeks, sent from PCP with trop .12, bnp 583.  On arrival vitals and physical exam as above. Pt states he has tachycardia at baseline, however with bipedal edema, bibasilar rales, and tachycardia, obtained CT PE study, which was negative.   Admitted for monitoring.    I have reviewed all laboratory and imaging studies if ordered as above  1. Tachycardia   2. Elevated troponin         Mirian Mo, MD 06/27/15 [phone]

## 2015-06-26 ENCOUNTER — Observation Stay (HOSPITAL_BASED_OUTPATIENT_CLINIC_OR_DEPARTMENT_OTHER): Payer: BLUE CROSS/BLUE SHIELD

## 2015-06-26 DIAGNOSIS — F17210 Nicotine dependence, cigarettes, uncomplicated: Secondary | ICD-10-CM | POA: Diagnosis not present

## 2015-06-26 DIAGNOSIS — R7989 Other specified abnormal findings of blood chemistry: Secondary | ICD-10-CM | POA: Diagnosis not present

## 2015-06-26 DIAGNOSIS — R0600 Dyspnea, unspecified: Secondary | ICD-10-CM | POA: Diagnosis not present

## 2015-06-26 DIAGNOSIS — I10 Essential (primary) hypertension: Secondary | ICD-10-CM

## 2015-06-26 DIAGNOSIS — E785 Hyperlipidemia, unspecified: Secondary | ICD-10-CM

## 2015-06-26 DIAGNOSIS — I5021 Acute systolic (congestive) heart failure: Secondary | ICD-10-CM | POA: Diagnosis not present

## 2015-06-26 DIAGNOSIS — R079 Chest pain, unspecified: Secondary | ICD-10-CM | POA: Diagnosis not present

## 2015-06-26 DIAGNOSIS — R000 Tachycardia, unspecified: Secondary | ICD-10-CM | POA: Diagnosis not present

## 2015-06-26 LAB — CBC
HEMATOCRIT: 44.2 % (ref 39.0–52.0)
HEMOGLOBIN: 15 g/dL (ref 13.0–17.0)
MCH: 32.1 pg (ref 26.0–34.0)
MCHC: 33.9 g/dL (ref 30.0–36.0)
MCV: 94.6 fL (ref 78.0–100.0)
Platelets: 217 10*3/uL (ref 150–400)
RBC: 4.67 MIL/uL (ref 4.22–5.81)
RDW: 14.3 % (ref 11.5–15.5)
WBC: 10.8 10*3/uL — ABNORMAL HIGH (ref 4.0–10.5)

## 2015-06-26 LAB — CREATININE, SERUM
Creatinine, Ser: 1.12 mg/dL (ref 0.61–1.24)
GFR calc Af Amer: >60 mL/min (ref >60)
GFR calc non Af Amer: >60 mL/min (ref >60)

## 2015-06-26 LAB — LIPID PANEL
CHOL/HDL RATIO: 8.5 ratio
CHOLESTEROL: 169 mg/dL (ref 0–200)
HDL: 20 mg/dL — ABNORMAL LOW (ref >40)
LDL CALC: 112 mg/dL — AB (ref 0–99)
TRIGLYCERIDES: 183 mg/dL — AB (ref <150)
VLDL: 37 mg/dL (ref 0–40)

## 2015-06-26 LAB — TSH: TSH: 4.397 u[IU]/mL (ref 0.350–4.500)

## 2015-06-26 LAB — TROPONIN I
Troponin I: 0.08 ng/mL — ABNORMAL HIGH (ref <0.031)
Troponin I: 0.08 ng/mL — ABNORMAL HIGH (ref <0.031)
Troponin I: 0.07 ng/mL — ABNORMAL HIGH (ref <0.031)

## 2015-06-26 LAB — T4, FREE: FREE T4: 0.78 ng/dL (ref 0.61–1.12)

## 2015-06-26 LAB — LACTIC ACID, PLASMA: Lactic Acid, Venous: 1.4 mmol/L (ref 0.5–2.0)

## 2015-06-26 LAB — BRAIN NATRIURETIC PEPTIDE: B NATRIURETIC PEPTIDE 5: 678.7 pg/mL — AB (ref 0.0–100.0)

## 2015-06-26 MED ORDER — SODIUM CHLORIDE 0.9 % IV SOLN
INTRAVENOUS | Status: DC
  Administered 2015-06-29: 06:00:00 via INTRAVENOUS

## 2015-06-26 MED ORDER — GI COCKTAIL ~~LOC~~: 30.0000 mL | Freq: Four times a day (QID) | ORAL | Status: DC | PRN

## 2015-06-26 MED ORDER — FUROSEMIDE 10 MG/ML IJ SOLN
20.0000 mg | Freq: Two times a day (BID) | INTRAMUSCULAR | Status: DC
  Administered 2015-06-26 – 2015-06-27 (×2): 20 mg via INTRAVENOUS
  Filled 2015-06-26 (×2): qty 2

## 2015-06-26 MED ORDER — ATORVASTATIN CALCIUM 10 MG PO TABS
10.0000 mg | ORAL_TABLET | Freq: Every day | ORAL | Status: DC
Start: 2015-06-26 — End: 2015-07-02
  Administered 2015-06-27: 10 mg via ORAL
  Filled 2015-06-26 (×6): qty 1

## 2015-06-26 MED ORDER — ZOLPIDEM TARTRATE 5 MG PO TABS: 5.0000 mg | ORAL_TABLET | Freq: Every evening | ORAL | Status: DC | PRN

## 2015-06-26 MED ORDER — ASPIRIN EC 325 MG PO TBEC
325.0000 mg | DELAYED_RELEASE_TABLET | Freq: Every day | ORAL | Status: DC
  Administered 2015-06-26: 325 mg via ORAL
  Filled 2015-06-26: qty 1

## 2015-06-26 MED ORDER — FLUTICASONE PROPIONATE 50 MCG/ACT NA SUSP
1.0000 | Freq: Every day | NASAL | Status: DC
  Administered 2015-06-26 – 2015-07-03 (×6): 1 via NASAL
  Filled 2015-06-26: qty 16

## 2015-06-26 MED ORDER — LISINOPRIL 2.5 MG PO TABS
2.5000 mg | ORAL_TABLET | Freq: Every day | ORAL | Status: DC
  Administered 2015-06-26: 2.5 mg via ORAL
  Filled 2015-06-26: qty 1

## 2015-06-26 MED ORDER — MORPHINE SULFATE (PF) 2 MG/ML IV SOLN: 2.0000 mg | INTRAVENOUS | Status: DC | PRN

## 2015-06-26 MED ORDER — ACETAMINOPHEN 325 MG PO TABS: 650.0000 mg | ORAL_TABLET | ORAL | Status: DC | PRN

## 2015-06-26 MED ORDER — SODIUM CHLORIDE 0.9 % IJ SOLN
3.0000 mL | Freq: Two times a day (BID) | INTRAMUSCULAR | Status: DC
  Administered 2015-06-26 – 2015-06-28 (×5): 3 mL via INTRAVENOUS

## 2015-06-26 MED ORDER — ONDANSETRON HCL 4 MG/2ML IJ SOLN: 4.0000 mg | Freq: Four times a day (QID) | INTRAMUSCULAR | Status: DC | PRN

## 2015-06-26 MED ORDER — PERFLUTREN LIPID MICROSPHERE
1.0000 mL | INTRAVENOUS | Status: AC | PRN
  Filled 2015-06-26: qty 10

## 2015-06-26 MED ORDER — ASPIRIN 81 MG PO CHEW
81.0000 mg | CHEWABLE_TABLET | ORAL | Status: AC
  Administered 2015-06-29: 81 mg via ORAL
  Filled 2015-06-26: qty 1

## 2015-06-26 MED ORDER — HEPARIN SODIUM (PORCINE) 5000 UNIT/ML IJ SOLN
5000.0000 [IU] | Freq: Three times a day (TID) | INTRAMUSCULAR | Status: DC
Start: 2015-06-26 — End: 2015-06-29
  Administered 2015-06-26 – 2015-06-28 (×9): 5000 [IU] via SUBCUTANEOUS
  Filled 2015-06-26 (×8): qty 1

## 2015-06-26 MED ORDER — SODIUM CHLORIDE 0.9 % IJ SOLN: 3.0000 mL | INTRAMUSCULAR | Status: DC | PRN

## 2015-06-26 MED ORDER — CARVEDILOL 3.125 MG PO TABS
3.1250 mg | ORAL_TABLET | Freq: Two times a day (BID) | ORAL | Status: DC
  Administered 2015-06-26 – 2015-06-29 (×6): 3.125 mg via ORAL
  Filled 2015-06-26 (×6): qty 1

## 2015-06-26 MED ORDER — SODIUM CHLORIDE 0.9 % IV SOLN: 250.0000 mL | INTRAVENOUS | Status: DC | PRN

## 2015-06-26 MED FILL — Perflutren Lipid Microsphere IV Susp 1.1 MG/ML: INTRAVENOUS | Qty: 10 | Status: AC

## 2015-06-26 NOTE — Progress Notes (Signed)
  Echocardiogram 2D Echocardiogram has been performed.  Arvil Chaco 06/26/2015, 1:20 PM

## 2015-06-26 NOTE — Progress Notes (Addendum)
[facility]                                                                              Patient Demographics  [name], is a 54 y.o. male, DOB - 1961-03-13, ZOX:[id number]  Admit date - 06/25/2015   Admitting Physician Yevonne Pax, MD  Outpatient Primary MD for the patient is No primary care provider on file.  LOS -    Chief Complaint  Patient presents with  . Shortness of Breath    Elevated troponin      HPI On 06/25/2015 by Dr. Freda Munro [name] is a 54 y.o. male with history of HTN HLD presents with shortness of breath. Patient states that for the last 2 months he has been having shortness of breath. Patient states that initially he was noticing difficulty when he lay down. He states that he felt suffocating when he would lay down. Later on he states that he was short of breath with walking. He states he has never had any chest pain. No left arm pain and no shoulder pain. He also denies having any back pain., he has a history of HTN but is not on any treatment at this time. He also state he has diverticulitis which is inactive. He went to his PCP Dr Modesto Charon and had som e blood work done which reveals an elevated troponin. In the ED here he was noted to be tachycardic and also his troponin was normal. A CT scan was done and this was negative for PE. Patient does smoke also  Assessment & Plan   Dyspnea secondary to New onset Systolic CHF -BNP 678.7 -Cardiology consulted and appreciated -Echocardiogram: EF 15-20% -Started patient on lasix IV, coreg, lisinopril (low doses due to soft BP) -Monitor daily weights, intake/output -Suspect patient will need cath.   Tachycardia -Has been ongoing issue (for over 20 years) -TSH 4.397, FT4 0.78 (WNL)  Elevated troponin -Relatively flat, unlikely ACS -Cardiology consulted and appreciated -Trending downward -Continue aspirin  Hypertension -BP controlled, currently on no home  medications  Hyperlipidemia -Lipid panel: TC169, TG 183, HDL 20, LDL 112 -Started on statin  Tobacco abuse -Discussed smoking cessation  Nasal congestion -Continue flonase  Mediastinal adenopathy -Seen on CT chest incidentally -Repeat CT in 3 months  Code Status: Full  Family Communication: None at bedside  Disposition Plan: Admitted  Time Spent in minutes   30 minutes  Procedures: Echocardiogram  Consultations: Cardiology  DVT Prophylaxis  Heparin  Lab Results  Component Value Date   PLT 217 06/26/2015    Medications  Scheduled Meds: . aspirin EC  325 mg Oral Daily  . atorvastatin  10 mg Oral q1800  . fluticasone  1 spray Each Nare Daily  . heparin  5,000 Units Subcutaneous 3 times per day   Continuous Infusions:  PRN Meds:.acetaminophen, gi cocktail, morphine injection, ondansetron (ZOFRAN) IV, zolpidem  Antibiotics    Anti-infectives    None      Subjective:   [name] seen and examined today.  Patient complains of shortness of breath and sinus congestion. He states he has shortness of breath on walking and with activity. Patient denies any cough, abdominal pain,  chest pain, nausea or vomiting.  Objective:   Filed Vitals:   06/26/15 0400 06/26/15 0600 06/26/15 0757 06/26/15 1146  BP: 87/50 115/82 124/87 120/80  Pulse: 102  112 112  Temp: 98.6 F (37 C)  98.4 F (36.9 C) 98.7 F (37.1 C)  TempSrc: Oral  Oral Oral  Resp: Height:      Weight:      SpO2: 94%  95% 96%    Wt Readings from Last 3 Encounters:  06/26/15 97.523 kg (215 lb)  10/13/13 92.987 kg (205 lb)  08/18/13 94.802 kg (209 lb)     Intake/Output Summary (Last 24 hours) at 06/26/15 1522 Last data filed at 06/26/15 0145  Gross per 24 hour  Intake      0 ml  Output      2 ml  Net     -2 ml    Exam  General: Well developed, well nourished, NAD, appears stated age  HEENT: NCAT, mucous membranes moist.  Cardiovascular: S1 S2 auscultated, 2/6  SEM, Regular rate and rhythm.  Respiratory: Clear to auscultation bilaterally with equal chest rise  Abdomen: Soft, obese, nontender, nondistended, + bowel sounds  Extremities: warm dry without cyanosis clubbing or edema  Neuro: AAOx3, nonfocal  Data Review   Micro Results No results found for this or any previous visit (from the past 240 hour(s)).  Radiology Reports Dg Chest 2 View  06/25/2015   CLINICAL DATA:  Cough and congestion for 2 months, increasing shortness of breath, smoking history  EXAM: CHEST  2 VIEW  COMPARISON:  Portable chest x-ray of 11/29/2010 and two-view chest x-ray of 11/17/2010  FINDINGS: There are somewhat prominent interstitial markings with a tiny amount of fluid in the fissures on the lateral view. With cardiomegaly, this may indicate very early interstitial edema. A tiny pleural effusion cannot be excluded with some blunting of the costophrenic angle on the lateral view. No focal infiltrate is seen. No bony abnormality is noted.  IMPRESSION: Cardiomegaly and somewhat prominent interstitial markings may represent mild interstitial edema.   Electronically Signed   By: Dwyane Dee M.D.   On: 06/25/2015 16:14   Ct Angio Chest Pe W/cm &/or Wo Cm  06/25/2015   CLINICAL DATA:  Short of breath for 2 months  EXAM: CT ANGIOGRAPHY CHEST WITH CONTRAST  TECHNIQUE: Multidetector CT imaging of the chest was performed using the standard protocol during bolus administration of intravenous contrast. Multiplanar CT image reconstructions and MIPs were obtained to evaluate the vascular anatomy.  CONTRAST:  OMNIPAQUE IOHEXOL 350 MG/ML SOLN  COMPARISON:  None.  FINDINGS: There are no filling defects in the pulmonary arterial tree to suggest acute pulmonary thromboembolism.  Left ventricular hypertrophy. There is some enlargement of the left atrium.  Abnormal mediastinal adenopathy. 18 mm short axis diameter precarinal node. 11 mm paratracheal node on image 29. Other scattered smaller  mediastinal nodes.  No pneumothorax.  No pleural effusion.  Clear lungs.  No vertebral compression deformity.  There is contrast reflux into the hepatic veins suggesting elevated right heart pressure.  Review of the MIP images confirms the above findings.  IMPRESSION: No evidence of acute pulmonary thromboembolism  There is abnormal mediastinal adenopathy. A lymphoproliferative disorder is not excluded. Correlate clinically as for the need for further imaging or three-month follow-up CT.   Electronically Signed   By: Jolaine Click M.D.   On: 06/25/2015 22:42    CBC  Recent Labs Lab 06/25/15 1918 06/26/15  0238  WBC 10.7* 10.8*  HGB 15.6 15.0  HCT 46.2 44.2  PLT 241 217  MCV 93.9 94.6  MCH 31.7 32.1  MCHC 33.8 33.9  RDW 14.2 14.3    Chemistries   Recent Labs Lab 06/25/15 1918 06/26/15 0238  NA 135  --   K 4.2  --   CL 106  --   CO2 19*  --   GLUCOSE 96  --   BUN 15  --   CREATININE 1.23 1.12  CALCIUM 8.7*  --    ------------------------------------------------------------------------------------------------------------------ estimated creatinine clearance is 80 mL/min (by C-G formula based on Cr of 1.12). ------------------------------------------------------------------------------------------------------------------ No results for input(s): HGBA1C in the last 72 hours. ------------------------------------------------------------------------------------------------------------------  Recent Labs  06/26/15 0238  CHOL 169  HDL 20*  LDLCALC 112*  TRIG 183*  CHOLHDL 8.5   ------------------------------------------------------------------------------------------------------------------  Recent Labs  06/26/15 0238  TSH 4.397   ------------------------------------------------------------------------------------------------------------------ No results for input(s): VITAMINB12, FOLATE, FERRITIN, TIBC, IRON, RETICCTPCT in the last 72 hours.  Coagulation profile No  results for input(s): INR, PROTIME in the last 168 hours.  No results for input(s): DDIMER in the last 72 hours.  Cardiac Enzymes  Recent Labs Lab 06/26/15 0238 06/26/15 0245 06/26/15 0740  TROPONINI 0.08* 0.08* 0.07*   ------------------------------------------------------------------------------------------------------------------ Invalid input(s): POCBNP    Annebelle Bostic D.O. on 06/26/2015 at 3:22 PM  Between 7am to 7pm - Pager - [phone]  After 7pm go to [email or web address] - password [email or web address]  And look for the night coverage person covering for me after hours  [facility] Office  [phone]

## 2015-06-26 NOTE — Progress Notes (Signed)
Echo reviewed. LVEF severely depressed. Appropriate initial Rx started by Dr Catha Gosselin (coreg and ACE). Right and left heart cath is indicated to evaluate etiology of severe LV dysfunction and assessment of hemodynamics. I have reviewed the risks, indications, and alternatives to cardiac catheterization and possible PCI with the patient. Risks include but are not limited to bleeding, infection, vascular injury, stroke, myocardial infection, arrhythmia, kidney injury, radiation-related injury in the case of prolonged fluoroscopy use, emergency cardiac surgery, and death. The patient understands the risks of serious complication is low (<1%). Will arrange Monday.  Tonny Bollman 06/26/2015 5:53 PM

## 2015-06-26 NOTE — Consult Note (Signed)
CARDIOLOGY CONSULT NOTE  Patient ID: [name], MRN: [id number], DOB/AGE: 1961/01/12 54 y.o. Admit date: 06/25/2015 Date of Consult: 06/26/2015  Primary Physician: No primary care provider on file. Primary Cardiologist: none Referring Physician: Dr Welton Flakes  Chief Complaint: Shortness of breath  Reason for Consultation: Shortness of breath/Chest pain  HPI: 54 yo male with no past hx of cardiac problems who is admitted yesterday with shortness of breath. He reports progressive shortness of breath now over the past 2 months. Cough has been present. No clear edema, orthopnea, or PND. Yesterday he also had discomfort in his epigastrium and chest, described as pressure, nonpleuritic, and nonradiating.   The patient works at [employer]. He has noted progressive exercise intolerance related to weakness, orthopedic problems, and shortness of breath. He denies fever. States it is normal for his heart rate to run between 95-115 bpm at rest and this is longstanding for many years. Had a remote episode of SVT but this was about 20 yrs ago with no recurrence. He has no recent exertional chest pain or pressure.  Medical History:  Past Medical History  Diagnosis Date  . Diverticulitis     ACTIVE WITH ABCESS      Surgical History:  Past Surgical History  Procedure Laterality Date  . Tonsillectomy  54 YEARS OLD  . Hernia repair  66 OR 54 YEARS OLD  . Colon surgery       Home Meds: Prior to Admission medications   Medication Sig Start Date End Date Taking? Authorizing Provider  fluticasone (FLONASE) 50 MCG/ACT nasal spray Place 1 spray into both nostrils daily.   Yes Historical Provider, MD  Multiple Vitamins-Minerals (MULTIVITAL PO) Take by mouth daily. MENS MEGA MULTIVIT.    Yes Historical Provider, MD  vitamin B-12 (CYANOCOBALAMIN) 500 MCG tablet Take 500 mcg by mouth daily.   Yes Historical Provider, MD  HYDROcodone-homatropine (HYCODAN) 5-1.5 MG/5ML syrup Take 5 mLs by mouth every 6  (six) hours as needed for cough. Patient not taking: Reported on 06/25/2015 10/13/13   Tonye Pearson, MD  ipratropium (ATROVENT) 0.06 % nasal spray Place 2 sprays into the nose 3 (three) times daily. Patient not taking: Reported on 06/25/2015 10/13/13   Tonye Pearson, MD    Inpatient Medications:  . aspirin EC  325 mg Oral Daily  . atorvastatin  10 mg Oral q1800  . fluticasone  1 spray Each Nare Daily  . heparin  5,000 Units Subcutaneous 3 times per day      Allergies:  Allergies  Allergen Reactions  . Pseudoephedrine     REACTION: Elevated HR    Social History   Social History  . Marital Status: Divorced    Spouse Name: N/A  . Number of Children: N/A  . Years of Education: N/A   Occupational History  . Not on file.   Social History Main Topics  . Smoking status: Current Every Day Smoker  . Smokeless tobacco: Not on file  . Alcohol Use: Yes     Comment: weekly  . Drug Use: Not on file  . Sexual Activity: Not on file   Other Topics Concern  . Not on file   Social History Narrative     Family History  Problem Relation Age of Onset  . Hypertension Mother      Review of Systems: General: negative for chills, fever, night sweats or weight changes.  ENT: negative for rhinorrhea or epistaxis Cardiovascular:  See HPI Dermatological: negative for rash Respiratory:  negative for wheezing, positive for cough and dyspnea GI: negative for nausea, vomiting, diarrhea, bright red blood per rectum, melena, or hematemesis GU: no hematuria, urgency, or frequency Neurologic: negative for visual changes, syncope, headache, or dizziness Heme: no easy bruising or bleeding Endo: negative for excessive thirst, thyroid disorder, or flushing Musculoskeletal: positive for back pain  All other systems reviewed and are otherwise negative except as noted above.  Physical Exam: Blood pressure 124/87, pulse 112, temperature 98.4 F (36.9 C), temperature source Oral, resp. rate  18, height  (1.651 m), weight 215 lb (97.523 kg), SpO2 95 %. Pt is alert and oriented, WD, WN, obese male in no distress. HEENT: normal Neck: JVP normal. Carotid upstrokes normal without bruits. No thyromegaly. Lungs: equal expansion, clear bilaterally CV: Apex is nonpalpable, tachycardic and regular with distant heart sounds. There is a 2/6 systolic murmur present at the apex Abd: soft, NT, +BS, no bruit, no hepatosplenomegaly, obese Back: no CVA tenderness Ext: trace edema        DP/PT pulses intact and = Skin: warm and dry without rash Neuro: CNII-XII intact             Strength intact = bilaterally    Labs:  Recent Labs  06/25/15 2256 06/26/15 0238 06/26/15 0245  TROPONINI 0.09* 0.08* 0.08*   Lab Results  Component Value Date   WBC 10.8* 06/26/2015   HGB 15.0 06/26/2015   HCT 44.2 06/26/2015   MCV 94.6 06/26/2015   PLT 217 06/26/2015    Recent Labs Lab 06/25/15 1918 06/26/15 0238  NA 135  --   K 4.2  --   CL 106  --   CO2 19*  --   BUN 15  --   CREATININE 1.23 1.12  CALCIUM 8.7*  --   GLUCOSE 96  --    Lab Results  Component Value Date   CHOL 169 06/26/2015   HDL 20* 06/26/2015   LDLCALC 112* 06/26/2015   TRIG 183* 06/26/2015   No results found for: DDIMER  Radiology/Studies:  Dg Chest 2 View  06/25/2015   CLINICAL DATA:  Cough and congestion for 2 months, increasing shortness of breath, smoking history  EXAM: CHEST  2 VIEW  COMPARISON:  Portable chest x-ray of 11/29/2010 and two-view chest x-ray of 11/17/2010  FINDINGS: There are somewhat prominent interstitial markings with a tiny amount of fluid in the fissures on the lateral view. With cardiomegaly, this may indicate very early interstitial edema. A tiny pleural effusion cannot be excluded with some blunting of the costophrenic angle on the lateral view. No focal infiltrate is seen. No bony abnormality is noted.  IMPRESSION: Cardiomegaly and somewhat prominent interstitial markings may represent  mild interstitial edema.   Electronically Signed   By: Dwyane Dee M.D.   On: 06/25/2015 16:14   Ct Angio Chest Pe W/cm &/or Wo Cm  06/25/2015   CLINICAL DATA:  Short of breath for 2 months  EXAM: CT ANGIOGRAPHY CHEST WITH CONTRAST  TECHNIQUE: Multidetector CT imaging of the chest was performed using the standard protocol during bolus administration of intravenous contrast. Multiplanar CT image reconstructions and MIPs were obtained to evaluate the vascular anatomy.  CONTRAST:  OMNIPAQUE IOHEXOL 350 MG/ML SOLN  COMPARISON:  None.  FINDINGS: There are no filling defects in the pulmonary arterial tree to suggest acute pulmonary thromboembolism.  Left ventricular hypertrophy. There is some enlargement of the left atrium.  Abnormal mediastinal adenopathy. 18 mm short axis diameter precarinal node. 11  mm paratracheal node on image 29. Other scattered smaller mediastinal nodes.  No pneumothorax.  No pleural effusion.  Clear lungs.  No vertebral compression deformity.  There is contrast reflux into the hepatic veins suggesting elevated right heart pressure.  Review of the MIP images confirms the above findings.  IMPRESSION: No evidence of acute pulmonary thromboembolism  There is abnormal mediastinal adenopathy. A lymphoproliferative disorder is not excluded. Correlate clinically as for the need for further imaging or three-month follow-up CT.   Electronically Signed   By: Jolaine Click M.D.   On: 06/25/2015 22:42    EKG: Sinus tachycardia, HR 107 bpm, LAD, T wave abnormality consider lateral ischemia  Cardiac Studies: Trop 0.09, 0.08, 0.08   ASSESSMENT AND PLAN:  1. Shortness of breath: possible acute heart failure contributing to clinical picture. Exam difficult because of obesity but he may have mitral regurgitation based on apical murmur. Pt needs echocardiogram. Will add BNP to labs. Will follow-up after studies completed.   2. Elevated troponin: minimal troponin elevation with flat trend not  suggestive of ACS. CP is atypical and is a secondary complaint with shortness of breath being much more of a problem. Unless LV dysfunction or other signs of ischemic heart disease on echo, would not plan on further ischemic testing in hospital.  3. Tachycardia: this is chronic and unchanged per patient. Should not require further evaluation.   4. Mediastinal adenopathy: seen on CT incidentally. Per primary service.   Will follow with you.  Enzo Bi MD, [facility] 06/26/2015, 8:36 AM

## 2015-06-27 DIAGNOSIS — R000 Tachycardia, unspecified: Secondary | ICD-10-CM | POA: Diagnosis not present

## 2015-06-27 DIAGNOSIS — I480 Paroxysmal atrial fibrillation: Secondary | ICD-10-CM | POA: Diagnosis not present

## 2015-06-27 DIAGNOSIS — R0600 Dyspnea, unspecified: Secondary | ICD-10-CM | POA: Diagnosis not present

## 2015-06-27 DIAGNOSIS — R7989 Other specified abnormal findings of blood chemistry: Secondary | ICD-10-CM | POA: Diagnosis not present

## 2015-06-27 DIAGNOSIS — F17210 Nicotine dependence, cigarettes, uncomplicated: Secondary | ICD-10-CM | POA: Diagnosis not present

## 2015-06-27 DIAGNOSIS — I5021 Acute systolic (congestive) heart failure: Secondary | ICD-10-CM | POA: Diagnosis not present

## 2015-06-27 LAB — MAGNESIUM: Magnesium: 2.1 mg/dL (ref 1.7–2.4)

## 2015-06-27 LAB — BASIC METABOLIC PANEL
ANION GAP: 10 (ref 5–15)
BUN: 16 mg/dL (ref 6–20)
CO2: 22 mmol/L (ref 22–32)
Calcium: 8.7 mg/dL — ABNORMAL LOW (ref 8.9–10.3)
Chloride: 107 mmol/L (ref 101–111)
Creatinine, Ser: 1.11 mg/dL (ref 0.61–1.24)
GFR calc Af Amer: >60 mL/min (ref >60)
GFR calc non Af Amer: >60 mL/min (ref >60)
GLUCOSE: 116 mg/dL — AB (ref 65–99)
POTASSIUM: 4.3 mmol/L (ref 3.5–5.1)
Sodium: 139 mmol/L (ref 135–145)

## 2015-06-27 MED ORDER — ASPIRIN EC 81 MG PO TBEC
81.0000 mg | DELAYED_RELEASE_TABLET | Freq: Every day | ORAL | Status: DC
  Administered 2015-06-27 – 2015-07-03 (×6): 81 mg via ORAL
  Filled 2015-06-27 (×6): qty 1

## 2015-06-27 MED ORDER — SPIRONOLACTONE 25 MG PO TABS
12.5000 mg | ORAL_TABLET | Freq: Every day | ORAL | Status: DC
  Administered 2015-06-27 – 2015-07-02 (×6): 12.5 mg via ORAL
  Filled 2015-06-27 (×6): qty 1

## 2015-06-27 MED ORDER — FUROSEMIDE 40 MG PO TABS
40.0000 mg | ORAL_TABLET | Freq: Every day | ORAL | Status: DC
  Administered 2015-06-27 – 2015-06-28 (×2): 40 mg via ORAL
  Filled 2015-06-27 (×2): qty 1

## 2015-06-27 MED ORDER — LOSARTAN POTASSIUM 25 MG PO TABS
25.0000 mg | ORAL_TABLET | Freq: Every day | ORAL | Status: DC
  Administered 2015-06-27 – 2015-07-01 (×5): 25 mg via ORAL
  Filled 2015-06-27 (×5): qty 1

## 2015-06-27 NOTE — Progress Notes (Signed)
[facility]                                                                              Patient Demographics  [name], is a 54 y.o. male, DOB - 1961-01-11, GNF:[id number]  Admit date - 06/25/2015   Admitting Physician Yevonne Pax, MD  Outpatient Primary MD for the patient is No primary care provider on file.  LOS -    Chief Complaint  Patient presents with  . Shortness of Breath    Elevated troponin      HPI On 06/25/2015 by Dr. Freda Munro [name] is a 54 y.o. male with history of HTN HLD presents with shortness of breath. Patient states that for the last 2 months he has been having shortness of breath. Patient states that initially he was noticing difficulty when he lay down. He states that he felt suffocating when he would lay down. Later on he states that he was short of breath with walking. He states he has never had any chest pain. No left arm pain and no shoulder pain. He also denies having any back pain., he has a history of HTN but is not on any treatment at this time. He also state he has diverticulitis which is inactive. He went to his PCP Dr Modesto Charon and had som e blood work done which reveals an elevated troponin. In the ED here he was noted to be tachycardic and also his troponin was normal. A CT scan was done and this was negative for PE. Patient does smoke also  Assessment & Plan   Dyspnea secondary to New onset Systolic CHF -BNP 678.7 -Cardiology consulted and appreciated, suspecting NICM vs undiagnosed OSA  -Echocardiogram: EF 15-20% -Monitor daily weights, intake/output. Urine output over past 24hrs 2125cc -Patient scheduled for right/left heart cath on 06/29/2015 -Transitioned to PO lasix and spironolactone -Continue aspirin, Coreg, losartan  Tachycardia -Has been ongoing issue (for over 20 years) -TSH 4.397, FT4 0.78 (WNL)  Elevated troponin -Relatively flat, unlikely ACS -Cardiology consulted and appreciated -Trending  downward -Continue aspirin  Hypertension -BP controlled, currently on no home medications  Hyperlipidemia -Lipid panel: TC169, TG 183, HDL 20, LDL 112 -Started on statin  Tobacco abuse -Discussed smoking cessation  Nasal congestion -Continue flonase  Mediastinal adenopathy -Seen on CT chest incidentally -Repeat CT in 3 months  Code Status: Full  Family Communication: None at bedside  Disposition Plan: Admitted  Time Spent in minutes   30 minutes  Procedures: Echocardiogram  Consultations: Cardiology  DVT Prophylaxis  Heparin  Lab Results  Component Value Date   PLT 217 06/26/2015    Medications  Scheduled Meds: . [START ON 06/29/2015] aspirin  81 mg Oral Pre-Cath  . aspirin EC  81 mg Oral Daily  . atorvastatin  10 mg Oral q1800  . carvedilol  3.125 mg Oral BID WC  . fluticasone  1 spray Each Nare Daily  . furosemide  40 mg Oral Daily  . heparin  5,000 Units Subcutaneous 3 times per day  . losartan  25 mg Oral Daily  . sodium chloride  3 mL Intravenous Q12H  . spironolactone  12.5 mg Oral Daily   Continuous  Infusions: . sodium chloride     PRN Meds:.sodium chloride, acetaminophen, gi cocktail, morphine injection, ondansetron (ZOFRAN) IV, sodium chloride, zolpidem  Antibiotics    Anti-infectives    None      Subjective:   Jacolyn Reedy seen and examined today.  Patient feels shortness of breath improved. Denies chest pain, abdominal pain, nausea or vomiting at this time.  Objective:   Filed Vitals:   06/26/15 1700 06/26/15 2131 06/27/15 0500 06/27/15 0855  BP: 105/77 114/77 128/85 100/61  Pulse: 108 108 108   Temp:  97.7 F (36.5 C) 98 F (36.7 C)   TempSrc:  Oral Oral   Resp:  16 16   Height:      Weight:   97.6 kg (215 lb 2.7 oz)   SpO2: 96% 96% 98%     Wt Readings from Last 3 Encounters:  06/27/15 97.6 kg (215 lb 2.7 oz)  10/13/13 92.987 kg (205 lb)  08/18/13 94.802 kg (209 lb)     Intake/Output Summary (Last 24 hours)  at 06/27/15 1026 Last data filed at 06/27/15 0820  Gross per 24 hour  Intake    640 ml  Output   2125 ml  Net  -1485 ml    Exam  General: Well developed, well nourished, NAD  HEENT: NCAT, mucous membranes moist.  Cardiovascular: S1 S2 auscultated, Regular rate and rhythm.  Respiratory: Clear to auscultation   Abdomen: Soft, obese, nontender, nondistended, + bowel sounds  Extremities: warm dry without cyanosis clubbing or edema  Neuro: AAOx3, nonfocal  Psych: Appropriate mood and affect  Data Review   Micro Results No results found for this or any previous visit (from the past 240 hour(s)).  Radiology Reports Dg Chest 2 View  06/25/2015   CLINICAL DATA:  Cough and congestion for 2 months, increasing shortness of breath, smoking history  EXAM: CHEST  2 VIEW  COMPARISON:  Portable chest x-ray of 11/29/2010 and two-view chest x-ray of 11/17/2010  FINDINGS: There are somewhat prominent interstitial markings with a tiny amount of fluid in the fissures on the lateral view. With cardiomegaly, this may indicate very early interstitial edema. A tiny pleural effusion cannot be excluded with some blunting of the costophrenic angle on the lateral view. No focal infiltrate is seen. No bony abnormality is noted.  IMPRESSION: Cardiomegaly and somewhat prominent interstitial markings may represent mild interstitial edema.   Electronically Signed   By: Dwyane Dee M.D.   On: 06/25/2015 16:14   Ct Angio Chest Pe W/cm &/or Wo Cm  06/25/2015   CLINICAL DATA:  Short of breath for 2 months  EXAM: CT ANGIOGRAPHY CHEST WITH CONTRAST  TECHNIQUE: Multidetector CT imaging of the chest was performed using the standard protocol during bolus administration of intravenous contrast. Multiplanar CT image reconstructions and MIPs were obtained to evaluate the vascular anatomy.  CONTRAST:  OMNIPAQUE IOHEXOL 350 MG/ML SOLN  COMPARISON:  None.  FINDINGS: There are no filling defects in the pulmonary arterial  tree to suggest acute pulmonary thromboembolism.  Left ventricular hypertrophy. There is some enlargement of the left atrium.  Abnormal mediastinal adenopathy. 18 mm short axis diameter precarinal node. 11 mm paratracheal node on image 29. Other scattered smaller mediastinal nodes.  No pneumothorax.  No pleural effusion.  Clear lungs.  No vertebral compression deformity.  There is contrast reflux into the hepatic veins suggesting elevated right heart pressure.  Review of the MIP images confirms the above findings.  IMPRESSION: No evidence of acute pulmonary thromboembolism  There is abnormal mediastinal adenopathy. A lymphoproliferative disorder is not excluded. Correlate clinically as for the need for further imaging or three-month follow-up CT.   Electronically Signed   By: Jolaine Click M.D.   On: 06/25/2015 22:42    CBC  Recent Labs Lab 06/25/15 1918 06/26/15 0238  WBC 10.7* 10.8*  HGB 15.6 15.0  HCT 46.2 44.2  PLT 241 217  MCV 93.9 94.6  MCH 31.7 32.1  MCHC 33.8 33.9  RDW 14.2 14.3    Chemistries   Recent Labs Lab 06/25/15 1918 06/26/15 0238 06/27/15 0310  NA 135  --  139  K 4.2  --  4.3  CL 106  --  107  CO2 19*  --  22  GLUCOSE 96  --  116*  BUN 15  --  16  CREATININE 1.23 1.12 1.11  CALCIUM 8.7*  --  8.7*  MG  --   --  2.1   ------------------------------------------------------------------------------------------------------------------ estimated creatinine clearance is 80.7 mL/min (by C-G formula based on Cr of 1.11). ------------------------------------------------------------------------------------------------------------------ No results for input(s): HGBA1C in the last 72 hours. ------------------------------------------------------------------------------------------------------------------  Recent Labs  06/26/15 0238  CHOL 169  HDL 20*  LDLCALC 112*  TRIG 183*  CHOLHDL 8.5    ------------------------------------------------------------------------------------------------------------------  Recent Labs  06/26/15 0238  TSH 4.397   ------------------------------------------------------------------------------------------------------------------ No results for input(s): VITAMINB12, FOLATE, FERRITIN, TIBC, IRON, RETICCTPCT in the last 72 hours.  Coagulation profile No results for input(s): INR, PROTIME in the last 168 hours.  No results for input(s): DDIMER in the last 72 hours.  Cardiac Enzymes  Recent Labs Lab 06/26/15 0238 06/26/15 0245 06/26/15 0740  TROPONINI 0.08* 0.08* 0.07*   ------------------------------------------------------------------------------------------------------------------ Invalid input(s): POCBNP    Kayloni Rocco D.O. on 06/27/2015 at 10:26 AM  Between 7am to 7pm - Pager - [phone]  After 7pm go to [email or web address] - password [email or web address]  And look for the night coverage person covering for me after hours  [facility] Office  [phone]

## 2015-06-27 NOTE — Progress Notes (Signed)
Advanced Heart Failure Rounding Note   Subjective:     Asked by family for HF service to assume care (we see his father in clinic)   54 y/o male with obesity but no other CRFs admitted with acute systolic HF with biventricular dysfunction. Feels better after diuresis.   Echo LV severely dilated (LVIDd 7.2cm) EF 15-20% RV mild to moderately reduced    Objective:   Weight Range:  Vital Signs:   Temp:  [97.7 F (36.5 C)-98.7 F (37.1 C)] 98 F (36.7 C) (10/08 0500) Pulse Rate:  [108-112] 108 (10/08 0500) Resp:  [16-18] 16 (10/08 0500) BP: (105-128)/(77-87) 128/85 mmHg (10/08 0500) SpO2:  [95 %-98 %] 98 % (10/08 0500) Weight:  [97.6 kg (215 lb 2.7 oz)] 97.6 kg (215 lb 2.7 oz) (10/08 0500)    Weight change: Filed Weights   06/26/15 0154 06/27/15 0500  Weight: 97.523 kg (215 lb) 97.6 kg (215 lb 2.7 oz)    Intake/Output:   Intake/Output Summary (Last 24 hours) at 06/27/15 0732 Last data filed at 06/27/15 9381  Gross per 24 hour  Intake    360 ml  Output   2125 ml  Net  -1765 ml     Physical Exam: General:  Lying flat in bed. No resp difficulty HEENT: normal Neck: supple. JVP 6  . Carotids 2+ bilat; no bruits. No lymphadenopathy or thryomegaly appreciated. Cor: PMI laterally displaced. Regular rate & rhythm. No rubs, gallops or murmurs. Lungs: clear Abdomen:  Obese soft, nontender, nondistended. No hepatosplenomegaly. No bruits or masses. Good bowel sounds. Extremities: no cyanosis, clubbing, rash, edema Neuro: alert & orientedx3, cranial nerves grossly intact. moves all 4 extremities w/o difficulty. Affect pleasant  Telemetry: Sinus tach. 8 beat run NSVT  Labs: Basic Metabolic Panel:  Recent Labs Lab 06/25/15 1918 06/26/15 0238 06/27/15 0310  NA 135  --  139  K 4.2  --  4.3  CL 106  --  107  CO2 19*  --  22  GLUCOSE 96  --  116*  BUN 15  --  16  CREATININE 1.23 1.12 1.11  CALCIUM 8.7*  --  8.7*  MG  --   --  2.1    Liver Function Tests: No  results for input(s): AST, ALT, ALKPHOS, BILITOT, PROT, ALBUMIN in the last 168 hours. No results for input(s): LIPASE, AMYLASE in the last 168 hours. No results for input(s): AMMONIA in the last 168 hours.  CBC:  Recent Labs Lab 06/25/15 1918 06/26/15 0238  WBC 10.7* 10.8*  HGB 15.6 15.0  HCT 46.2 44.2  MCV 93.9 94.6  PLT 241 217    Cardiac Enzymes:  Recent Labs Lab 06/25/15 2256 06/26/15 0238 06/26/15 0245 06/26/15 0740  TROPONINI 0.09* 0.08* 0.08* 0.07*    BNP: BNP (last 3 results)  Recent Labs  06/26/15 0238  BNP 678.7*    ProBNP (last 3 results) No results for input(s): PROBNP in the last 8760 hours.    Other results:  Imaging: Dg Chest 2 View  06/25/2015   CLINICAL DATA:  Cough and congestion for 2 months, increasing shortness of breath, smoking history  EXAM: CHEST  2 VIEW  COMPARISON:  Portable chest x-ray of 11/29/2010 and two-view chest x-ray of 11/17/2010  FINDINGS: There are somewhat prominent interstitial markings with a tiny amount of fluid in the fissures on the lateral view. With cardiomegaly, this may indicate very early interstitial edema. A tiny pleural effusion cannot be excluded with some blunting of the  costophrenic angle on the lateral view. No focal infiltrate is seen. No bony abnormality is noted.  IMPRESSION: Cardiomegaly and somewhat prominent interstitial markings may represent mild interstitial edema.   Electronically Signed   By: Dwyane Dee M.D.   On: 06/25/2015 16:14   Ct Angio Chest Pe W/cm &/or Wo Cm  06/25/2015   CLINICAL DATA:  Short of breath for 2 months  EXAM: CT ANGIOGRAPHY CHEST WITH CONTRAST  TECHNIQUE: Multidetector CT imaging of the chest was performed using the standard protocol during bolus administration of intravenous contrast. Multiplanar CT image reconstructions and MIPs were obtained to evaluate the vascular anatomy.  CONTRAST:  OMNIPAQUE IOHEXOL 350 MG/ML SOLN  COMPARISON:  None.  FINDINGS: There are no filling  defects in the pulmonary arterial tree to suggest acute pulmonary thromboembolism.  Left ventricular hypertrophy. There is some enlargement of the left atrium.  Abnormal mediastinal adenopathy. 18 mm short axis diameter precarinal node. 11 mm paratracheal node on image 29. Other scattered smaller mediastinal nodes.  No pneumothorax.  No pleural effusion.  Clear lungs.  No vertebral compression deformity.  There is contrast reflux into the hepatic veins suggesting elevated right heart pressure.  Review of the MIP images confirms the above findings.  IMPRESSION: No evidence of acute pulmonary thromboembolism  There is abnormal mediastinal adenopathy. A lymphoproliferative disorder is not excluded. Correlate clinically as for the need for further imaging or three-month follow-up CT.   Electronically Signed   By: Jolaine Click M.D.   On: 06/25/2015 22:42      Medications:     Scheduled Medications: . [START ON 06/29/2015] aspirin  81 mg Oral Pre-Cath  . aspirin EC  325 mg Oral Daily  . atorvastatin  10 mg Oral q1800  . carvedilol  3.125 mg Oral BID WC  . fluticasone  1 spray Each Nare Daily  . furosemide  20 mg Intravenous Q12H  . heparin  5,000 Units Subcutaneous 3 times per day  . lisinopril  2.5 mg Oral Daily  . sodium chloride  3 mL Intravenous Q12H     Infusions: . sodium chloride       PRN Medications:  sodium chloride, acetaminophen, gi cocktail, morphine injection, ondansetron (ZOFRAN) IV, sodium chloride, zolpidem   Assessment:   1. Acute systolic HF. LVEF 15-20% 2. NSVT 3. Morbid obesity   Plan/Discussion:    Given size of LV suspect this is NICM. Possibly related to undiagnosed OSA. Volume status much improved. Will switch to po lasix. Add spiro. Switch lisinopril to losartan so we can transition to [facility] more easily if BP stable. Decrease ASA to 81.   CR to see. Will need outpatient sleep study.   R/L cath on Monday.    Length of Stay:    Arvilla Meres  MD 06/27/2015, 7:32 AM  Advanced Heart Failure Team Pager [phone] (M-F; 7a - 4p)  Please contact [facility] for night-coverage after hours (4p -7a ) and weekends on [email or web address]

## 2015-06-27 NOTE — Progress Notes (Signed)
8 beats NSVT noted on telemetry.  Pt asymptomatic and sleeping.  Dr. Sherren Kerns notified.  Orders received for am BMET and Mag level.  Will continue to monitor.  [name]

## 2015-06-28 DIAGNOSIS — R000 Tachycardia, unspecified: Secondary | ICD-10-CM | POA: Diagnosis not present

## 2015-06-28 DIAGNOSIS — F17210 Nicotine dependence, cigarettes, uncomplicated: Secondary | ICD-10-CM | POA: Diagnosis not present

## 2015-06-28 DIAGNOSIS — I5021 Acute systolic (congestive) heart failure: Secondary | ICD-10-CM | POA: Diagnosis not present

## 2015-06-28 DIAGNOSIS — R0600 Dyspnea, unspecified: Secondary | ICD-10-CM | POA: Diagnosis not present

## 2015-06-28 LAB — BASIC METABOLIC PANEL
Anion gap: 10 (ref 5–15)
BUN: 18 mg/dL (ref 6–20)
CHLORIDE: 102 mmol/L (ref 101–111)
CO2: 23 mmol/L (ref 22–32)
CREATININE: 1.12 mg/dL (ref 0.61–1.24)
Calcium: 8.6 mg/dL — ABNORMAL LOW (ref 8.9–10.3)
Glucose, Bld: 140 mg/dL — ABNORMAL HIGH (ref 65–99)
POTASSIUM: 3.9 mmol/L (ref 3.5–5.1)
SODIUM: 135 mmol/L (ref 135–145)

## 2015-06-28 LAB — CBC
HCT: 46 % (ref 39.0–52.0)
Hemoglobin: 15.4 g/dL (ref 13.0–17.0)
MCH: 31.4 pg (ref 26.0–34.0)
MCHC: 33.5 g/dL (ref 30.0–36.0)
MCV: 93.9 fL (ref 78.0–100.0)
PLATELETS: 229 10*3/uL (ref 150–400)
RBC: 4.9 MIL/uL (ref 4.22–5.81)
RDW: 13.9 % (ref 11.5–15.5)
WBC: 9.4 10*3/uL (ref 4.0–10.5)

## 2015-06-28 LAB — PROTIME-INR
INR: 1.28 (ref 0.00–1.49)
Prothrombin Time: 16.1 seconds — ABNORMAL HIGH (ref 11.6–15.2)

## 2015-06-28 MED ORDER — DIGOXIN 250 MCG PO TABS
0.2500 mg | ORAL_TABLET | Freq: Every day | ORAL | Status: DC
Start: 2015-06-28 — End: 2015-07-03
  Administered 2015-06-28 – 2015-07-03 (×6): 0.25 mg via ORAL
  Filled 2015-06-28 (×6): qty 1

## 2015-06-28 NOTE — Progress Notes (Addendum)
Advanced Heart Failure Rounding Note   Subjective:     Asked by family for HF service to assume care (we see his father in clinic)   54 y/o male with obesity and tobacco use admitted with acute systolic HF with biventricular dysfunction.   Feels better. Weight down 10 pounds from admit. Lying flat. No dyspnea. Slept well for first time in weeks. Family says he doesn't snore much. Drinks 2-5 beers per week.    Echo LV severely dilated (LVIDd 7.2cm) EF 15-20% RV mild to moderately reduced    Objective:   Weight Range:  Vital Signs:   Temp:  [97.7 F (36.5 C)-98.1 F (36.7 C)] 98.1 F (36.7 C) (10/09 0500) Pulse Rate:  [99-105] 102 (10/09 0500) Resp:  [18-20] 18 (10/09 0500) BP: (98-113)/(61-78) 113/78 mmHg (10/09 0500) SpO2:  [94 %-99 %] 96 % (10/09 0500) Weight:  [93.033 kg (205 lb 1.6 oz)] 93.033 kg (205 lb 1.6 oz) (10/09 0500) Last BM Date: 06/27/15  Weight change: Filed Weights   06/26/15 0154 06/27/15 0500 06/28/15 0500  Weight: 97.523 kg (215 lb) 97.6 kg (215 lb 2.7 oz) 93.033 kg (205 lb 1.6 oz)    Intake/Output:   Intake/Output Summary (Last 24 hours) at 06/28/15 0958 Last data filed at 06/27/15 2200  Gross per 24 hour  Intake    840 ml  Output   2000 ml  Net  -1160 ml     Physical Exam: General:  Lying flat in bed. No resp difficulty HEENT: normal Neck: supple. JVP flat  . Carotids 2+ bilat; no bruits. No lymphadenopathy or thryomegaly appreciated. Cor: PMI laterally displaced. Regular rate & rhythm. No rubs, gallops or murmurs. Lungs: clear Abdomen:  Obese soft, nontender, nondistended. No hepatosplenomegaly. No bruits or masses. Good bowel sounds. Extremities: no cyanosis, clubbing, rash, edema Neuro: alert & orientedx3, cranial nerves grossly intact. moves all 4 extremities w/o difficulty. Affect pleasant  Telemetry: Sinus tach 110  Labs: Basic Metabolic Panel:  Recent Labs Lab 06/25/15 1918 06/26/15 0238 06/27/15 0310 06/28/15 0402    NA 135  --  139 135  K 4.2  --  4.3 3.9  CL 106  --  107 102  CO2 19*  --  22 23  GLUCOSE 96  --  116* 140*  BUN 15  --  16 18  CREATININE 1.23 1.12 1.11 1.12  CALCIUM 8.7*  --  8.7* 8.6*  MG  --   --  2.1  --     Liver Function Tests: No results for input(s): AST, ALT, ALKPHOS, BILITOT, PROT, ALBUMIN in the last 168 hours. No results for input(s): LIPASE, AMYLASE in the last 168 hours. No results for input(s): AMMONIA in the last 168 hours.  CBC:  Recent Labs Lab 06/25/15 1918 06/26/15 0238 06/28/15 0402  WBC 10.7* 10.8* 9.4  HGB 15.6 15.0 15.4  HCT 46.2 44.2 46.0  MCV 93.9 94.6 93.9  PLT 241 217 229    Cardiac Enzymes:  Recent Labs Lab 06/25/15 2256 06/26/15 0238 06/26/15 0245 06/26/15 0740  TROPONINI 0.09* 0.08* 0.08* 0.07*    BNP: BNP (last 3 results)  Recent Labs  06/26/15 0238  BNP 678.7*    ProBNP (last 3 results) No results for input(s): PROBNP in the last 8760 hours.    Other results:  Imaging: No results found.   Medications:     Scheduled Medications: . [START ON 06/29/2015] aspirin  81 mg Oral Pre-Cath  . aspirin EC  81 mg  Oral Daily  . atorvastatin  10 mg Oral q1800  . carvedilol  3.125 mg Oral BID WC  . fluticasone  1 spray Each Nare Daily  . furosemide  40 mg Oral Daily  . heparin  5,000 Units Subcutaneous 3 times per day  . losartan  25 mg Oral Daily  . sodium chloride  3 mL Intravenous Q12H  . spironolactone  12.5 mg Oral Daily    Infusions: . sodium chloride      PRN Medications: sodium chloride, acetaminophen, gi cocktail, morphine injection, ondansetron (ZOFRAN) IV, sodium chloride, zolpidem   Assessment:   1. Acute systolic HF. LVEF 15-20% 2. NSVT 3. Morbid obesity 4. H/o sinus tachycardia HR 95-110   Plan/Discussion:    Given size of LV suspect this is NICM.  Volume status much improved. Now on po diuretics. BP soft so will not titrate HF meds.  Will add dig. May be Corlanor and Entresto candidate  soon.  R/L cath tomorrow am.  Consider outpatient sleep study.   Arvilla Meres MD 06/28/2015, 9:58 AM  Advanced Heart Failure Team Pager [phone] (M-F; 7a - 4p)  Please contact [facility] for night-coverage after hours (4p -7a ) and weekends on [email or web address]

## 2015-06-28 NOTE — Progress Notes (Signed)
Pt had an episode were he felt double vision and increase shortness of breath and gemerolized weakness, all that is resolved on its own except persistent weakness. Pt believes that those symptoms are related the " new medication given to him today: Digoxin. Vitats signs are as follow: BP 104/78 P 110 R 18  Sat 99 % on room air. MD was paged. Will continue to ,monitor. Raynelle Bring

## 2015-06-28 NOTE — Progress Notes (Signed)
[facility]                                                                              Patient Demographics  [name], is a 54 y.o. male, DOB - 05-18-1961, ZOX:[id number]  Admit date - 06/25/2015   Admitting Physician Yevonne Pax, MD  Outpatient Primary MD for the patient is No primary care provider on file.  LOS -    Chief Complaint  Patient presents with  . Shortness of Breath    Elevated troponin      HPI On 06/25/2015 by Dr. Freda Munro ALLANTE WHITMIRE is a 54 y.o. male with history of HTN HLD presents with shortness of breath. Patient states that for the last 2 months he has been having shortness of breath. Patient states that initially he was noticing difficulty when he lay down. He states that he felt suffocating when he would lay down. Later on he states that he was short of breath with walking. He states he has never had any chest pain. No left arm pain and no shoulder pain. He also denies having any back pain., he has a history of HTN but is not on any treatment at this time. He also state he has diverticulitis which is inactive. He went to his PCP Dr Modesto Charon and had som e blood work done which reveals an elevated troponin. In the ED here he was noted to be tachycardic and also his troponin was normal. A CT scan was done and this was negative for PE. Patient does smoke also  Assessment & Plan   Dyspnea secondary to New onset Systolic CHF -BNP 678.7 -Cardiology consulted and appreciated, suspecting NICM vs undiagnosed OSA  -Echocardiogram: EF 15-20% -Monitor daily weights, intake/output. Urine output over past 24hrs 2000cc; weight down 10lbs -Patient scheduled for right/left heart cath on 06/29/2015 -Continue PO lasix and spironolactone, aspirin, Coreg, losartan  Tachycardia -Has been ongoing issue (for over 20 years) -TSH 4.397, FT4 0.78 (WNL)  Elevated troponin -Relatively flat, unlikely ACS -Cardiology consulted and appreciated -Trending  downward -Continue aspirin  Hypertension -BP controlled, currently on no home medications  Hyperlipidemia -Lipid panel: TC169, TG 183, HDL 20, LDL 112 -Continue statin  Tobacco abuse -Discussed smoking cessation  Nasal congestion -Continue flonase  Mediastinal adenopathy -Seen on CT chest incidentally -Repeat CT in 3 months  Code Status: Full  Family Communication: None at bedside  Disposition Plan: Admitted  Time Spent in minutes   30 minutes  Procedures: Echocardiogram  Consultations: Cardiology  DVT Prophylaxis  Heparin  Lab Results  Component Value Date   PLT 229 06/28/2015    Medications  Scheduled Meds: . [START ON 06/29/2015] aspirin  81 mg Oral Pre-Cath  . aspirin EC  81 mg Oral Daily  . atorvastatin  10 mg Oral q1800  . carvedilol  3.125 mg Oral BID WC  . digoxin  0.25 mg Oral Daily  . fluticasone  1 spray Each Nare Daily  . furosemide  40 mg Oral Daily  . heparin  5,000 Units Subcutaneous 3 times per day  . losartan  25 mg Oral Daily  . sodium chloride  3 mL Intravenous Q12H  . spironolactone  12.5 mg Oral Daily   Continuous Infusions: . sodium chloride     PRN Meds:.sodium chloride, acetaminophen, gi cocktail, morphine injection, ondansetron (ZOFRAN) IV, sodium chloride, zolpidem  Antibiotics    Anti-infectives    None      Subjective:   Jacolyn Reedy seen and examined today.  Patient feels shortness of breath improved. Denies chest pain, abdominal pain, nausea or vomiting at this time.  He feels he is able to sleep better and is able to lay flat.   Objective:   Filed Vitals:   06/27/15 1807 06/27/15 2100 06/28/15 0500 06/28/15 1030  BP: 100/61 112/74 113/78 120/73  Pulse:  105 102 102  Temp:  97.7 F (36.5 C) 98.1 F (36.7 C)   TempSrc:      Resp:  19 18   Height:      Weight:   93.033 kg (205 lb 1.6 oz)   SpO2:  99% 96%     Wt Readings from Last 3 Encounters:  06/28/15 93.033 kg (205 lb 1.6 oz)  10/13/13 92.987  kg (205 lb)  08/18/13 94.802 kg (209 lb)     Intake/Output Summary (Last 24 hours) at 06/28/15 1034 Last data filed at 06/27/15 2200  Gross per 24 hour  Intake    600 ml  Output   1250 ml  Net   -650 ml    Exam  General: Well developed, well nourished, NAD  HEENT: NCAT, mucous membranes moist.  Cardiovascular: S1 S2 auscultated, Regular rate and rhythm.  Respiratory: Clear to auscultation   Abdomen: Soft, obese, nontender, nondistended, + bowel sounds  Extremities: warm dry without cyanosis clubbing or edema  Neuro: AAOx3, nonfocal  Psych: Appropriate mood and affect, pleasant  Data Review   Micro Results No results found for this or any previous visit (from the past 240 hour(s)).  Radiology Reports Dg Chest 2 View  06/25/2015   CLINICAL DATA:  Cough and congestion for 2 months, increasing shortness of breath, smoking history  EXAM: CHEST  2 VIEW  COMPARISON:  Portable chest x-ray of 11/29/2010 and two-view chest x-ray of 11/17/2010  FINDINGS: There are somewhat prominent interstitial markings with a tiny amount of fluid in the fissures on the lateral view. With cardiomegaly, this may indicate very early interstitial edema. A tiny pleural effusion cannot be excluded with some blunting of the costophrenic angle on the lateral view. No focal infiltrate is seen. No bony abnormality is noted.  IMPRESSION: Cardiomegaly and somewhat prominent interstitial markings may represent mild interstitial edema.   Electronically Signed   By: Dwyane Dee M.D.   On: 06/25/2015 16:14   Ct Angio Chest Pe W/cm &/or Wo Cm  06/25/2015   CLINICAL DATA:  Short of breath for 2 months  EXAM: CT ANGIOGRAPHY CHEST WITH CONTRAST  TECHNIQUE: Multidetector CT imaging of the chest was performed using the standard protocol during bolus administration of intravenous contrast. Multiplanar CT image reconstructions and MIPs were obtained to evaluate the vascular anatomy.  CONTRAST:  OMNIPAQUE IOHEXOL 350  MG/ML SOLN  COMPARISON:  None.  FINDINGS: There are no filling defects in the pulmonary arterial tree to suggest acute pulmonary thromboembolism.  Left ventricular hypertrophy. There is some enlargement of the left atrium.  Abnormal mediastinal adenopathy. 18 mm short axis diameter precarinal node. 11 mm paratracheal node on image 29. Other scattered smaller mediastinal nodes.  No pneumothorax.  No pleural effusion.  Clear lungs.  No vertebral compression deformity.  There is contrast reflux into the  hepatic veins suggesting elevated right heart pressure.  Review of the MIP images confirms the above findings.  IMPRESSION: No evidence of acute pulmonary thromboembolism  There is abnormal mediastinal adenopathy. A lymphoproliferative disorder is not excluded. Correlate clinically as for the need for further imaging or three-month follow-up CT.   Electronically Signed   By: Jolaine Click M.D.   On: 06/25/2015 22:42    CBC  Recent Labs Lab 06/25/15 1918 06/26/15 0238 06/28/15 0402  WBC 10.7* 10.8* 9.4  HGB 15.6 15.0 15.4  HCT 46.2 44.2 46.0  PLT 241 217 229  MCV 93.9 94.6 93.9  MCH 31.7 32.1 31.4  MCHC 33.8 33.9 33.5  RDW 14.2 14.3 13.9    Chemistries   Recent Labs Lab 06/25/15 1918 06/26/15 0238 06/27/15 0310 06/28/15 0402  NA 135  --  139 135  K 4.2  --  4.3 3.9  CL 106  --  107 102  CO2 19*  --  22 23  GLUCOSE 96  --  116* 140*  BUN 15  --  16 18  CREATININE 1.23 1.12 1.11 1.12  CALCIUM 8.7*  --  8.7* 8.6*  MG  --   --  2.1  --    ------------------------------------------------------------------------------------------------------------------ estimated creatinine clearance is 78.1 mL/min (by C-G formula based on Cr of 1.12). ------------------------------------------------------------------------------------------------------------------ No results for input(s): HGBA1C in the last 72  hours. ------------------------------------------------------------------------------------------------------------------  Recent Labs  06/26/15 0238  CHOL 169  HDL 20*  LDLCALC 112*  TRIG 183*  CHOLHDL 8.5   ------------------------------------------------------------------------------------------------------------------  Recent Labs  06/26/15 0238  TSH 4.397   ------------------------------------------------------------------------------------------------------------------ No results for input(s): VITAMINB12, FOLATE, FERRITIN, TIBC, IRON, RETICCTPCT in the last 72 hours.  Coagulation profile  Recent Labs Lab 06/28/15 0402  INR 1.28    No results for input(s): DDIMER in the last 72 hours.  Cardiac Enzymes  Recent Labs Lab 06/26/15 0238 06/26/15 0245 06/26/15 0740  TROPONINI 0.08* 0.08* 0.07*   ------------------------------------------------------------------------------------------------------------------ Invalid input(s): POCBNP    Yemaya Barnier D.O. on 06/28/2015 at 10:34 AM  Between 7am to 7pm - Pager - [phone]  After 7pm go to [email or web address] - password [email or web address]  And look for the night coverage person covering for me after hours  [facility] Office  [phone]

## 2015-06-29 ENCOUNTER — Telehealth: Payer: Self-pay | Admitting: Internal Medicine

## 2015-06-29 ENCOUNTER — Encounter (HOSPITAL_COMMUNITY)
Admission: EM | Disposition: A | Discharge status: Home / Self Care | Payer: Self-pay | Source: Home / Self Care | Attending: Internal Medicine

## 2015-06-29 DIAGNOSIS — R570 Cardiogenic shock: Secondary | ICD-10-CM | POA: Diagnosis present

## 2015-06-29 DIAGNOSIS — E785 Hyperlipidemia, unspecified: Secondary | ICD-10-CM | POA: Diagnosis present

## 2015-06-29 DIAGNOSIS — F17210 Nicotine dependence, cigarettes, uncomplicated: Secondary | ICD-10-CM | POA: Diagnosis not present

## 2015-06-29 DIAGNOSIS — I4891 Unspecified atrial fibrillation: Secondary | ICD-10-CM | POA: Diagnosis not present

## 2015-06-29 DIAGNOSIS — R0602 Shortness of breath: Secondary | ICD-10-CM | POA: Diagnosis present

## 2015-06-29 DIAGNOSIS — Z7951 Long term (current) use of inhaled steroids: Secondary | ICD-10-CM | POA: Diagnosis not present

## 2015-06-29 DIAGNOSIS — R0600 Dyspnea, unspecified: Secondary | ICD-10-CM

## 2015-06-29 DIAGNOSIS — R000 Tachycardia, unspecified: Secondary | ICD-10-CM | POA: Diagnosis not present

## 2015-06-29 DIAGNOSIS — I5021 Acute systolic (congestive) heart failure: Secondary | ICD-10-CM | POA: Diagnosis present

## 2015-06-29 DIAGNOSIS — Z6835 Body mass index (BMI) 35.0-35.9, adult: Secondary | ICD-10-CM | POA: Diagnosis not present

## 2015-06-29 DIAGNOSIS — F17200 Nicotine dependence, unspecified, uncomplicated: Secondary | ICD-10-CM | POA: Diagnosis present

## 2015-06-29 DIAGNOSIS — Z8249 Family history of ischemic heart disease and other diseases of the circulatory system: Secondary | ICD-10-CM | POA: Diagnosis not present

## 2015-06-29 DIAGNOSIS — I428 Other cardiomyopathies: Secondary | ICD-10-CM | POA: Diagnosis present

## 2015-06-29 DIAGNOSIS — I110 Hypertensive heart disease with heart failure: Secondary | ICD-10-CM | POA: Diagnosis present

## 2015-06-29 DIAGNOSIS — I2510 Atherosclerotic heart disease of native coronary artery without angina pectoris: Secondary | ICD-10-CM | POA: Diagnosis present

## 2015-06-29 DIAGNOSIS — Z888 Allergy status to other drugs, medicaments and biological substances status: Secondary | ICD-10-CM | POA: Diagnosis not present

## 2015-06-29 DIAGNOSIS — R0981 Nasal congestion: Secondary | ICD-10-CM | POA: Diagnosis present

## 2015-06-29 DIAGNOSIS — I480 Paroxysmal atrial fibrillation: Secondary | ICD-10-CM | POA: Diagnosis not present

## 2015-06-29 DIAGNOSIS — I472 Ventricular tachycardia: Secondary | ICD-10-CM | POA: Diagnosis not present

## 2015-06-29 HISTORY — PX: CARDIAC CATHETERIZATION: SHX172

## 2015-06-29 LAB — POCT I-STAT 3, VENOUS BLOOD GAS (G3P V)
Acid-base deficit: 3 mmol/L — ABNORMAL HIGH (ref 0.0–2.0)
Acid-base deficit: 4 mmol/L — ABNORMAL HIGH (ref 0.0–2.0)
Bicarbonate: 21.4 mEq/L (ref 20.0–24.0)
Bicarbonate: 22.9 mEq/L (ref 20.0–24.0)
O2 Saturation: 45 %
O2 Saturation: 47 %
PCO2 VEN: 40 mmHg — AB (ref 45.0–50.0)
PCO2 VEN: 41.5 mmHg — AB (ref 45.0–50.0)
PH VEN: 7.337 — AB (ref 7.250–7.300)
TCO2: 23 mmol/L (ref 0–100)
TCO2: 24 mmol/L (ref 0–100)
pH, Ven: 7.35 — ABNORMAL HIGH (ref 7.250–7.300)
pO2, Ven: 26 mmHg — CL (ref 30.0–45.0)
pO2, Ven: 27 mmHg — CL (ref 30.0–45.0)

## 2015-06-29 LAB — CBC
HEMATOCRIT: 48.5 % (ref 39.0–52.0)
HEMOGLOBIN: 16.3 g/dL (ref 13.0–17.0)
MCH: 31.7 pg (ref 26.0–34.0)
MCHC: 33.6 g/dL (ref 30.0–36.0)
MCV: 94.4 fL (ref 78.0–100.0)
Platelets: 235 10*3/uL (ref 150–400)
RBC: 5.14 MIL/uL (ref 4.22–5.81)
RDW: 14 % (ref 11.5–15.5)
WBC: 10.1 10*3/uL (ref 4.0–10.5)

## 2015-06-29 LAB — POCT I-STAT 3, ART BLOOD GAS (G3+)
Acid-base deficit: 5 mmol/L — ABNORMAL HIGH (ref 0.0–2.0)
Bicarbonate: 17.5 mEq/L — ABNORMAL LOW (ref 20.0–24.0)
O2 Saturation: 97 %
PH ART: 7.419 (ref 7.350–7.450)
TCO2: 18 mmol/L (ref 0–100)
pCO2 arterial: 27.1 mmHg — ABNORMAL LOW (ref 35.0–45.0)
pO2, Arterial: 85 mmHg (ref 80.0–100.0)

## 2015-06-29 LAB — BASIC METABOLIC PANEL
ANION GAP: 9 (ref 5–15)
BUN: 20 mg/dL (ref 6–20)
CALCIUM: 9 mg/dL (ref 8.9–10.3)
CHLORIDE: 106 mmol/L (ref 101–111)
CO2: 23 mmol/L (ref 22–32)
CREATININE: 1.06 mg/dL (ref 0.61–1.24)
GFR calc non Af Amer: >60 mL/min (ref >60)
Glucose, Bld: 107 mg/dL — ABNORMAL HIGH (ref 65–99)
Potassium: 4.6 mmol/L (ref 3.5–5.1)
SODIUM: 138 mmol/L (ref 135–145)

## 2015-06-29 LAB — CARBOXYHEMOGLOBIN
CARBOXYHEMOGLOBIN: 1.4 % (ref 0.5–1.5)
Methemoglobin: 0.6 % (ref 0.0–1.5)
O2 SAT: 61.2 %
TOTAL HEMOGLOBIN: 16.7 g/dL (ref 13.5–18.0)

## 2015-06-29 LAB — MRSA PCR SCREENING: MRSA BY PCR: NEGATIVE

## 2015-06-29 SURGERY — RIGHT/LEFT HEART CATH AND CORONARY ANGIOGRAPHY
Anesthesia: LOCAL

## 2015-06-29 MED ORDER — MIDAZOLAM HCL 2 MG/2ML IJ SOLN
INTRAMUSCULAR | Status: DC | PRN
  Administered 2015-06-29: 1 mg via INTRAVENOUS

## 2015-06-29 MED ORDER — SODIUM CHLORIDE 0.9 % IJ SOLN
3.0000 mL | Freq: Two times a day (BID) | INTRAMUSCULAR | Status: DC
  Administered 2015-06-30 (×2): 3 mL via INTRAVENOUS

## 2015-06-29 MED ORDER — HEPARIN (PORCINE) IN NACL 2-0.9 UNIT/ML-% IJ SOLN
INTRAMUSCULAR | Status: AC
  Filled 2015-06-29: qty 1000

## 2015-06-29 MED ORDER — HEPARIN SODIUM (PORCINE) 5000 UNIT/ML IJ SOLN
5000.0000 [IU] | Freq: Three times a day (TID) | INTRAMUSCULAR | Status: DC
  Administered 2015-06-29 – 2015-07-01 (×7): 5000 [IU] via SUBCUTANEOUS
  Filled 2015-06-29 (×7): qty 1

## 2015-06-29 MED ORDER — FENTANYL CITRATE (PF) 100 MCG/2ML IJ SOLN
INTRAMUSCULAR | Status: DC | PRN
  Administered 2015-06-29 (×2): 25 ug via INTRAVENOUS

## 2015-06-29 MED ORDER — MIDAZOLAM HCL 2 MG/2ML IJ SOLN
INTRAMUSCULAR | Status: AC
  Filled 2015-06-29: qty 4

## 2015-06-29 MED ORDER — ACETAMINOPHEN 325 MG PO TABS: 650.0000 mg | ORAL_TABLET | ORAL | Status: DC | PRN

## 2015-06-29 MED ORDER — LIDOCAINE HCL (PF) 1 % IJ SOLN
INTRAMUSCULAR | Status: AC
  Filled 2015-06-29: qty 30

## 2015-06-29 MED ORDER — MILRINONE IN DEXTROSE 20 MG/100ML IV SOLN
0.1250 ug/kg/min | INTRAVENOUS | Status: DC
  Administered 2015-06-29 (×2): 0.25 ug/kg/min via INTRAVENOUS
  Administered 2015-06-30: 0.125 ug/kg/min via INTRAVENOUS
  Filled 2015-06-29 (×3): qty 100

## 2015-06-29 MED ORDER — LIDOCAINE HCL (PF) 1 % IJ SOLN
INTRAMUSCULAR | Status: DC | PRN
  Administered 2015-06-29: 12:00:00

## 2015-06-29 MED ORDER — ONDANSETRON HCL 4 MG/2ML IJ SOLN: 4.0000 mg | Freq: Four times a day (QID) | INTRAMUSCULAR | Status: DC | PRN

## 2015-06-29 MED ORDER — SODIUM CHLORIDE 0.9 % IV SOLN: INTRAVENOUS | Status: AC

## 2015-06-29 MED ORDER — NITROGLYCERIN 1 MG/10 ML FOR IR/CATH LAB
INTRA_ARTERIAL | Status: AC
  Filled 2015-06-29: qty 10

## 2015-06-29 MED ORDER — IOHEXOL 350 MG/ML SOLN
INTRAVENOUS | Status: DC | PRN
  Administered 2015-06-29: 100 mL via INTRA_ARTERIAL

## 2015-06-29 MED ORDER — SODIUM CHLORIDE 0.9 % IV SOLN: 250.0000 mL | INTRAVENOUS | Status: DC | PRN

## 2015-06-29 MED ORDER — FUROSEMIDE 10 MG/ML IJ SOLN
80.0000 mg | Freq: Two times a day (BID) | INTRAMUSCULAR | Status: DC
  Administered 2015-06-29: 80 mg via INTRAVENOUS
  Filled 2015-06-29: qty 8

## 2015-06-29 MED ORDER — SODIUM CHLORIDE 0.9 % IJ SOLN: 3.0000 mL | INTRAMUSCULAR | Status: DC | PRN

## 2015-06-29 MED ORDER — FENTANYL CITRATE (PF) 100 MCG/2ML IJ SOLN
INTRAMUSCULAR | Status: AC
  Filled 2015-06-29: qty 4

## 2015-06-29 SURGICAL SUPPLY — 14 items
CATH INFINITI 5FR JL5 (CATHETERS) ×2 IMPLANT
CATH INFINITI 5FR MULTPACK ANG (CATHETERS) ×2 IMPLANT
CATH SITESEER 5F NTR (CATHETERS) ×1 IMPLANT
CATH SWAN GANZ 7F STRAIGHT (CATHETERS) ×2 IMPLANT
KIT HEART LEFT (KITS) ×2 IMPLANT
KIT HEART RIGHT NAMIC (KITS) ×2 IMPLANT
PACK CARDIAC CATHETERIZATION (CUSTOM PROCEDURE TRAY) ×2 IMPLANT
SHEATH PINNACLE 5F 10CM (SHEATH) ×2 IMPLANT
SHEATH PINNACLE 7F 10CM (SHEATH) ×2 IMPLANT
SYR MEDRAD MARK V 150ML (SYRINGE) ×2 IMPLANT
TRANSDUCER W/STOPCOCK (MISCELLANEOUS) ×3 IMPLANT
TUBING CIL FLEX 10 FLL-RA (TUBING) ×2 IMPLANT
WIRE EMERALD 3MM-J .035X150CM (WIRE) ×2 IMPLANT
WIRE HI TORQ VERSACORE-J 145CM (WIRE) ×2 IMPLANT

## 2015-06-29 NOTE — Progress Notes (Signed)
Patient ambulated to BR, voiding large amount of clear yellow urine.  bm x1, r groin level 0, milrinone gtt infuising to picc line/RUA. Family at bedside. Appetite appropriate not complaints of pain. VSS.

## 2015-06-29 NOTE — Progress Notes (Signed)
CARDIAC REHAB PHASE I   PRE:  Rate/Rhythm: 101 ST    BP: sitting 105/70    SaO2: 97 RA  MODE:  Ambulation: 550 ft   POST:  Rate/Rhythm: 120 ST    BP: sitting 113/82     SaO2: 96 RA  Tolerated fairly well. Some SOB noted with distance and increased HR. Pt denies SOB, sts he feels better, more like his normal self. Sts he does almost everything right and doesn't understand how this happened to him. Gave low sodium sheets for him to start reading. Will f/u tomorrow after dx cath. [id number]   Harriet Masson CES, ACSM 06/29/2015 8:56 AM

## 2015-06-29 NOTE — H&P (View-Only) (Signed)
  Advanced Heart Failure Rounding Note   Subjective:     Asked by family for HF service to assume care (we see his father in clinic)   54 y/o male with obesity and tobacco use admitted with acute systolic HF with biventricular dysfunction.   Feels better. Weight down 10 pounds from admit. Lying flat. No dyspnea. Slept well for first time in weeks. Family says he doesn't snore much. Drinks 2-5 beers per week.    Echo LV severely dilated (LVIDd 7.2cm) EF 15-20% RV mild to moderately reduced    Objective:   Weight Range:  Vital Signs:   Temp:  [97.7 F (36.5 C)-98.1 F (36.7 C)] 98.1 F (36.7 C) (10/09 0500) Pulse Rate:  [99-105] 102 (10/09 0500) Resp:  [18-20] 18 (10/09 0500) BP: (98-113)/(61-78) 113/78 mmHg (10/09 0500) SpO2:  [94 %-99 %] 96 % (10/09 0500) Weight:  [93.033 kg (205 lb 1.6 oz)] 93.033 kg (205 lb 1.6 oz) (10/09 0500) Last BM Date: 06/27/15  Weight change: Filed Weights   06/26/15 0154 06/27/15 0500 06/28/15 0500  Weight: 97.523 kg (215 lb) 97.6 kg (215 lb 2.7 oz) 93.033 kg (205 lb 1.6 oz)    Intake/Output:   Intake/Output Summary (Last 24 hours) at 06/28/15 0958 Last data filed at 06/27/15 2200  Gross per 24 hour  Intake    840 ml  Output   2000 ml  Net  -1160 ml     Physical Exam: General:  Lying flat in bed. No resp difficulty HEENT: normal Neck: supple. JVP flat  . Carotids 2+ bilat; no bruits. No lymphadenopathy or thryomegaly appreciated. Cor: PMI laterally displaced. Regular rate & rhythm. No rubs, gallops or murmurs. Lungs: clear Abdomen:  Obese soft, nontender, nondistended. No hepatosplenomegaly. No bruits or masses. Good bowel sounds. Extremities: no cyanosis, clubbing, rash, edema Neuro: alert & orientedx3, cranial nerves grossly intact. moves all 4 extremities w/o difficulty. Affect pleasant  Telemetry: Sinus tach 110  Labs: Basic Metabolic Panel:  Recent Labs Lab 06/25/15 1918 06/26/15 0238 06/27/15 0310 06/28/15 0402    NA 135  --  139 135  K 4.2  --  4.3 3.9  CL 106  --  107 102  CO2 19*  --  22 23  GLUCOSE 96  --  116* 140*  BUN 15  --  16 18  CREATININE 1.23 1.12 1.11 1.12  CALCIUM 8.7*  --  8.7* 8.6*  MG  --   --  2.1  --     Liver Function Tests: No results for input(s): AST, ALT, ALKPHOS, BILITOT, PROT, ALBUMIN in the last 168 hours. No results for input(s): LIPASE, AMYLASE in the last 168 hours. No results for input(s): AMMONIA in the last 168 hours.  CBC:  Recent Labs Lab 06/25/15 1918 06/26/15 0238 06/28/15 0402  WBC 10.7* 10.8* 9.4  HGB 15.6 15.0 15.4  HCT 46.2 44.2 46.0  MCV 93.9 94.6 93.9  PLT 241 217 229    Cardiac Enzymes:  Recent Labs Lab 06/25/15 2256 06/26/15 0238 06/26/15 0245 06/26/15 0740  TROPONINI 0.09* 0.08* 0.08* 0.07*    BNP: BNP (last 3 results)  Recent Labs  06/26/15 0238  BNP 678.7*    ProBNP (last 3 results) No results for input(s): PROBNP in the last 8760 hours.    Other results:  Imaging: No results found.   Medications:     Scheduled Medications: . [START ON 06/29/2015] aspirin  81 mg Oral Pre-Cath  . aspirin EC  81 mg   Oral Daily  . atorvastatin  10 mg Oral q1800  . carvedilol  3.125 mg Oral BID WC  . fluticasone  1 spray Each Nare Daily  . furosemide  40 mg Oral Daily  . heparin  5,000 Units Subcutaneous 3 times per day  . losartan  25 mg Oral Daily  . sodium chloride  3 mL Intravenous Q12H  . spironolactone  12.5 mg Oral Daily    Infusions: . sodium chloride      PRN Medications: sodium chloride, acetaminophen, gi cocktail, morphine injection, ondansetron (ZOFRAN) IV, sodium chloride, zolpidem   Assessment:   1. Acute systolic HF. LVEF 15-20% 2. NSVT 3. Morbid obesity 4. H/o sinus tachycardia HR 95-110   Plan/Discussion:    Given size of LV suspect this is NICM.  Volume status much improved. Now on po diuretics. BP soft so will not titrate HF meds.  Will add dig. May be Corlanor and Entresto candidate  soon.  R/L cath tomorrow am.  Consider outpatient sleep study.   Arvilla Meres MD 06/28/2015, 9:58 AM  Advanced Heart Failure Team Pager [phone] (M-F; 7a - 4p)  Please contact [facility] for night-coverage after hours (4p -7a ) and weekends on [email or web address]

## 2015-06-29 NOTE — Interval H&P Note (Signed)
History and Physical Interval Note:  06/29/2015 10:44 AM  [name]  has presented today for surgery, with the diagnosis of cp  The various methods of treatment have been discussed with the patient and family. After consideration of risks, benefits and other options for treatment, the patient has consented to  Procedure(s): Right/Left Heart Cath and Coronary Angiography (N/A) and possible coronary angioplasty as a surgical intervention .  The patient's history has been reviewed, patient examined, no change in status, stable for surgery.  I have reviewed the patient's chart and labs.  Questions were answered to the patient's satisfaction.     Meghanne Pletz, Reuel Boom

## 2015-06-29 NOTE — Progress Notes (Signed)
[facility]                                                                              Patient Demographics  [name], is a 54 y.o. male, DOB - 19-Nov-1960, XYB:[id number]  Admit date - 06/25/2015   Admitting Physician Yevonne Pax, MD  Outpatient Primary MD for the patient is No primary care provider on file.  LOS -    Chief Complaint  Patient presents with  . Shortness of Breath    Elevated troponin      HPI On 06/25/2015 by Dr. Freda Munro [name] is a 54 y.o. male with history of HTN HLD presents with shortness of breath. Patient states that for the last 2 months he has been having shortness of breath. Patient states that initially he was noticing difficulty when he lay down. He states that he felt suffocating when he would lay down. Later on he states that he was short of breath with walking. He states he has never had any chest pain. No left arm pain and no shoulder pain. He also denies having any back pain., he has a history of HTN but is not on any treatment at this time. He also state he has diverticulitis which is inactive. He went to his PCP Dr Modesto Charon and had som e blood work done which reveals an elevated troponin. In the ED here he was noted to be tachycardic and also his troponin was normal. A CT scan was done and this was negative for PE. Patient does smoke also  Assessment & Plan   Dyspnea secondary to New onset Systolic CHF -BNP 678.7 -Cardiology consulted and appreciated, suspecting NICM vs undiagnosed OSA  -Echocardiogram: EF 15-20% -Monitor daily weights, intake/output. Urine output over past 24hrs 2300cc; weight down 12lbs -Patient scheduled for right/left heart cath today 06/29/2015 -Continue PO lasix and spironolactone, aspirin, Coreg, losartan  Tachycardia -Has been ongoing issue (for over 20 years) -TSH 4.397, FT4 0.78 (WNL)  Elevated troponin -Relatively flat, unlikely ACS -Cardiology consulted and appreciated -Trended  downward -Continue aspirin  Hypertension -BP controlled, currently on no home medications  Hyperlipidemia -Lipid panel: TC169, TG 183, HDL 20, LDL 112 -Continue statin  Tobacco abuse -Discussed smoking cessation  Nasal congestion -Continue flonase  Mediastinal adenopathy -Seen on CT chest incidentally -Repeat CT in 3 months  Code Status: Full  Family Communication: Parents at bedside  Disposition Plan: Admitted. Pending RHC/LHC today  Time Spent in minutes   30 minutes  Procedures: Echocardiogram  Consultations: Cardiology  DVT Prophylaxis  Heparin  Lab Results  Component Value Date   PLT 235 06/29/2015    Medications  Scheduled Meds: . aspirin EC  81 mg Oral Daily  . atorvastatin  10 mg Oral q1800  . carvedilol  3.125 mg Oral BID WC  . digoxin  0.25 mg Oral Daily  . fluticasone  1 spray Each Nare Daily  . furosemide  40 mg Oral Daily  . heparin  5,000 Units Subcutaneous 3 times per day  . losartan  25 mg Oral Daily  . sodium chloride  3 mL Intravenous Q12H  . spironolactone  12.5 mg Oral Daily   Continuous  Infusions: . sodium chloride 10 mL/hr at 06/29/15 0600   PRN Meds:.sodium chloride, acetaminophen, gi cocktail, morphine injection, ondansetron (ZOFRAN) IV, sodium chloride, zolpidem  Antibiotics    Anti-infectives    None      Subjective:   [name] seen and examined today.  Patient feels shortness of breath improved and has been able to sleep better. Denies chest pain, abdominal pain, nausea or vomiting.   Objective:   Filed Vitals:   06/28/15 1743 06/28/15 2100 06/29/15 0500 06/29/15 0807  BP: 102/79 95/59 112/74 102/71  Pulse: 107 98 94 96  Temp: 98 F (36.7 C) 98.1 F (36.7 C) 98.5 F (36.9 C)   TempSrc:      Resp:  20 17   Height:      Weight:   92.171 kg (203 lb 3.2 oz)   SpO2: 95% 91% 100%     Wt Readings from Last 3 Encounters:  06/29/15 92.171 kg (203 lb 3.2 oz)  10/13/13 92.987 kg (205 lb)  08/18/13  94.802 kg (209 lb)     Intake/Output Summary (Last 24 hours) at 06/29/15 0957 Last data filed at 06/29/15 0500  Gross per 24 hour  Intake    840 ml  Output   1650 ml  Net   -810 ml    Exam  General: Well developed, well nourished, NAD  HEENT: NCAT, mucous membranes moist.  Cardiovascular: S1 S2 auscultated, RRR  Respiratory: Clear to auscultation bilaterally  Abdomen: Soft, obese, nontender, nondistended, + bowel sounds  Extremities: warm dry without cyanosis clubbing or edema  Neuro: AAOx3, nonfocal  Psych: Appropriate mood and affect, pleasant  Data Review   Micro Results No results found for this or any previous visit (from the past 240 hour(s)).  Radiology Reports Dg Chest 2 View  06/25/2015   CLINICAL DATA:  Cough and congestion for 2 months, increasing shortness of breath, smoking history  EXAM: CHEST  2 VIEW  COMPARISON:  Portable chest x-ray of 11/29/2010 and two-view chest x-ray of 11/17/2010  FINDINGS: There are somewhat prominent interstitial markings with a tiny amount of fluid in the fissures on the lateral view. With cardiomegaly, this may indicate very early interstitial edema. A tiny pleural effusion cannot be excluded with some blunting of the costophrenic angle on the lateral view. No focal infiltrate is seen. No bony abnormality is noted.  IMPRESSION: Cardiomegaly and somewhat prominent interstitial markings may represent mild interstitial edema.   Electronically Signed   By: Dwyane Dee M.D.   On: 06/25/2015 16:14   Ct Angio Chest Pe W/cm &/or Wo Cm  06/25/2015   CLINICAL DATA:  Short of breath for 2 months  EXAM: CT ANGIOGRAPHY CHEST WITH CONTRAST  TECHNIQUE: Multidetector CT imaging of the chest was performed using the standard protocol during bolus administration of intravenous contrast. Multiplanar CT image reconstructions and MIPs were obtained to evaluate the vascular anatomy.  CONTRAST:  OMNIPAQUE IOHEXOL 350 MG/ML SOLN  COMPARISON:  None.   FINDINGS: There are no filling defects in the pulmonary arterial tree to suggest acute pulmonary thromboembolism.  Left ventricular hypertrophy. There is some enlargement of the left atrium.  Abnormal mediastinal adenopathy. 18 mm short axis diameter precarinal node. 11 mm paratracheal node on image 29. Other scattered smaller mediastinal nodes.  No pneumothorax.  No pleural effusion.  Clear lungs.  No vertebral compression deformity.  There is contrast reflux into the hepatic veins suggesting elevated right heart pressure.  Review of the MIP images confirms the  above findings.  IMPRESSION: No evidence of acute pulmonary thromboembolism  There is abnormal mediastinal adenopathy. A lymphoproliferative disorder is not excluded. Correlate clinically as for the need for further imaging or three-month follow-up CT.   Electronically Signed   By: Jolaine Click M.D.   On: 06/25/2015 22:42    CBC  Recent Labs Lab 06/25/15 1918 06/26/15 0238 06/28/15 0402 06/29/15 0344  WBC 10.7* 10.8* 9.4 10.1  HGB 15.6 15.0 15.4 16.3  HCT 46.2 44.2 46.0 48.5  PLT 241 217 229 235  MCV 93.9 94.6 93.9 94.4  MCH 31.7 32.1 31.4 31.7  MCHC 33.8 33.9 33.5 33.6  RDW 14.2 14.3 13.9 14.0    Chemistries   Recent Labs Lab 06/25/15 1918 06/26/15 0238 06/27/15 0310 06/28/15 0402 06/29/15 0344  NA 135  --  139 135 138  K 4.2  --  4.3 3.9 4.6  CL 106  --  107 102 106  CO2 19*  --  GLUCOSE 96  --  116* 140* 107*  BUN 15  --  CREATININE 1.23 1.12 1.11 1.12 1.06  CALCIUM 8.7*  --  8.7* 8.6* 9.0  MG  --   --  2.1  --   --    ------------------------------------------------------------------------------------------------------------------ estimated creatinine clearance is 82.2 mL/min (by C-G formula based on Cr of 1.06). ------------------------------------------------------------------------------------------------------------------ No results for input(s): HGBA1C in the last 72  hours. ------------------------------------------------------------------------------------------------------------------ No results for input(s): CHOL, HDL, LDLCALC, TRIG, CHOLHDL, LDLDIRECT in the last 72 hours. ------------------------------------------------------------------------------------------------------------------ No results for input(s): TSH, T4TOTAL, T3FREE, THYROIDAB in the last 72 hours.  Invalid input(s): FREET3 ------------------------------------------------------------------------------------------------------------------ No results for input(s): VITAMINB12, FOLATE, FERRITIN, TIBC, IRON, RETICCTPCT in the last 72 hours.  Coagulation profile  Recent Labs Lab 06/28/15 0402  INR 1.28    No results for input(s): DDIMER in the last 72 hours.  Cardiac Enzymes  Recent Labs Lab 06/26/15 0238 06/26/15 0245 06/26/15 0740  TROPONINI 0.08* 0.08* 0.07*   ------------------------------------------------------------------------------------------------------------------ Invalid input(s): POCBNP    [name] D.O. on 06/29/2015 at 9:57 AM  Between 7am to 7pm - Pager - [phone]  After 7pm go to [email or web address] - password [email or web address]  And look for the night coverage person covering for me after hours  [facility] Office  [phone]

## 2015-06-29 NOTE — Progress Notes (Signed)
Milrinone and NS transferred to PICC line with new IV tubing to each.

## 2015-06-29 NOTE — Progress Notes (Signed)
Peripherally Inserted Central Catheter/Midline Placement  The IV Nurse has discussed with the patient and/or persons authorized to consent for the patient, the purpose of this procedure and the potential benefits and risks involved with this procedure.  The benefits include less needle sticks, lab draws from the catheter and patient may be discharged home with the catheter.  Risks include, but not limited to, infection, bleeding, blood clot (thrombus formation), and puncture of an artery; nerve damage and irregular heat beat.  Alternatives to this procedure were also discussed.  PICC/Midline Placement Documentation  PICC / Midline Double Lumen 06/29/15 PICC Right Brachial 40 cm 2 cm (Active)  Indication for Insertion or Continuance of Line Vasoactive infusions 06/29/2015  3:24 PM  Exposed Catheter (cm) 2 cm 06/29/2015  3:24 PM  Dressing Change Due 07/06/15 06/29/2015  3:24 PM       Dewain Penning M 06/29/2015, 3:27 PM

## 2015-06-29 NOTE — Progress Notes (Signed)
5 fr sheath removed from RFA and direct pressure held for total of 15 minutes.  At the 10 minute mark, the 70fr sheath was removed from the RFV and pressure held for 10 minutes with complete hemostasis of both sites.  DSG applied and pt instructions given with voiced feedback.  Distal pulses palpatable after removal.

## 2015-06-29 NOTE — Progress Notes (Signed)
   After spending some time reviewing records I realize that the patient is on the heart failure service.  Assume heart phase service will manage the patient. No charge placed today despite time spent.

## 2015-06-29 NOTE — Telephone Encounter (Signed)
Received records from [facility] for appointment on 07/16/15 with Dr Rennis Golden.  Records given to [facility] (medical records) for Dr [facility] schedule on 07/16/15. lp

## 2015-06-30 ENCOUNTER — Encounter (HOSPITAL_COMMUNITY): Payer: Self-pay | Admitting: Internal Medicine

## 2015-06-30 ENCOUNTER — Inpatient Hospital Stay (HOSPITAL_COMMUNITY): Payer: BLUE CROSS/BLUE SHIELD

## 2015-06-30 DIAGNOSIS — I5021 Acute systolic (congestive) heart failure: Secondary | ICD-10-CM

## 2015-06-30 LAB — CBC
HEMATOCRIT: 46.6 % (ref 39.0–52.0)
HEMOGLOBIN: 15.7 g/dL (ref 13.0–17.0)
MCH: 31.7 pg (ref 26.0–34.0)
MCHC: 33.7 g/dL (ref 30.0–36.0)
MCV: 94 fL (ref 78.0–100.0)
Platelets: 230 10*3/uL (ref 150–400)
RBC: 4.96 MIL/uL (ref 4.22–5.81)
RDW: 13.9 % (ref 11.5–15.5)
WBC: 9.8 10*3/uL (ref 4.0–10.5)

## 2015-06-30 LAB — BASIC METABOLIC PANEL
ANION GAP: 8 (ref 5–15)
BUN: 20 mg/dL (ref 6–20)
CHLORIDE: 105 mmol/L (ref 101–111)
CO2: 25 mmol/L (ref 22–32)
Calcium: 8.8 mg/dL — ABNORMAL LOW (ref 8.9–10.3)
Creatinine, Ser: 1.22 mg/dL (ref 0.61–1.24)
GFR calc Af Amer: >60 mL/min (ref >60)
GLUCOSE: 132 mg/dL — AB (ref 65–99)
POTASSIUM: 4.1 mmol/L (ref 3.5–5.1)
Sodium: 138 mmol/L (ref 135–145)

## 2015-06-30 LAB — CARBOXYHEMOGLOBIN
Carboxyhemoglobin: 1.2 % (ref 0.5–1.5)
METHEMOGLOBIN: 0.7 % (ref 0.0–1.5)
O2 Saturation: 70 %
TOTAL HEMOGLOBIN: 15.1 g/dL (ref 13.5–18.0)

## 2015-06-30 MED ORDER — GADOBENATE DIMEGLUMINE 529 MG/ML IV SOLN
30.0000 mL | Freq: Once | INTRAVENOUS | Status: AC | PRN
  Administered 2015-06-30: 30 mL via INTRAVENOUS

## 2015-06-30 NOTE — Progress Notes (Signed)
CARDIAC REHAB PHASE I   PRE:  Rate/Rhythm: 117 ST  BP:  Supine:   Sitting: 135/82  Standing:    SaO2: 98%RA  MODE:  Ambulation: 350 ft   POST:  Rate/Rhythm: 122 ST  BP:  Supine:   Sitting: 104/80  Standing:    SaO2: 96%RA 1435-1455 Pt walked 350 ft on RA with steady gait. Pt had just bathed and seemed a little winded after walk. No complaints. To recliner after walk with call bell. Getting ready to go to MRI soon.   Luetta Nutting, RN BSN  06/30/2015 2:51 PM

## 2015-06-30 NOTE — Progress Notes (Signed)
[facility]                                                                              Patient Demographics  [name] McEaster Schinke25 y.o. male, DOB - 23-Jun-1961, GEX:[id number]  Admit date - 06/25/2015   Admitting Physician Yevonne Pax, MD  Outpatient Primary MD for the patient is No primary care provider on file.  LOS -    Chief Complaint  Patient presents with  . Shortness of Breath    Elevated troponin      HPI On 06/25/2015 by Dr. Freda Munro [name] is a 54 y.o. male with history of HTN HLD presents with shortness of breath. Patient states that for the last 2 months he has been having shortness of breath. Patient states that initially he was noticing difficulty when he lay down. He states that he felt suffocating when he would lay down. Later on he states that he was short of breath with walking. He states he has never had any chest pain. No left arm pain and no shoulder pain. He also denies having any back pain., he has a history of HTN but is not on any treatment at this time. He also state he has diverticulitis which is inactive. He went to his PCP Dr Modesto Charon and had som e blood work done which reveals an elevated troponin. In the ED here he was noted to be tachycardic and also his troponin was normal. A CT scan was done and this was negative for PE. Patient does smoke also  Assessment & Plan   Dyspnea secondary to New onset Systolic CHF -BNP 678.7 -Cardiology consulted and appreciated, suspecting NICM vs undiagnosed OSA  -Echocardiogram: EF 15-20% -Monitor daily weights, intake/output. Urine output over past 24hrs 2300cc; weight down 12lbs -s/p RHC/LHC 06/29/2015: showed nonobtructive CAD, PCWP 27 -Placed on milrinone drip -Coreg discontinued due to low output -patient will need a cardiac MRI prior to discharge -Continue losartan, digoxin, spironolactone, statin, aspirin  Tachycardia -Has been ongoing issue (for over 20 years) -TSH 4.397, FT4 0.78  (WNL)  Elevated troponin -Relatively flat, unlikely ACS -Cardiology consulted and appreciated -Trended downward -Continue aspirin  Hypertension -BP controlled, currently on no home medications  Hyperlipidemia -Lipid panel: TC169, TG 183, HDL 20, LDL 112 -Continue statin  Tobacco abuse -Discussed smoking cessation  Nasal congestion -Continue flonase  Mediastinal adenopathy -Seen on CT chest incidentally -Repeat CT in 3 months  Code Status: Full  Family Communication: None at bedside  Disposition Plan: Admitted.  Appreciate Dr. Gala Romney who will assume care of the patient.   Time Spent in minutes   30 minutes  Procedures: Echocardiogram Left and right heart cath  Consultations: Cardiology  DVT Prophylaxis  Heparin  Lab Results  Component Value Date   PLT 230 06/30/2015    Medications  Scheduled Meds: . aspirin EC  81 mg Oral Daily  . atorvastatin  10 mg Oral q1800  . digoxin  0.25 mg Oral Daily  . fluticasone  1 spray Each Nare Daily  . heparin  5,000 Units Subcutaneous 3 times per day  . losartan  25 mg Oral Daily  . sodium chloride  3  mL Intravenous Q12H  . spironolactone  12.5 mg Oral Daily   Continuous Infusions: . milrinone 0.125 mcg/kg/min (06/30/15 1000)   PRN Meds:.sodium chloride, acetaminophen, gi cocktail, morphine injection, ondansetron (ZOFRAN) IV, sodium chloride, zolpidem  Antibiotics    Anti-infectives    None      Subjective:   [name] seen and examined today. Has no complaints this morning.  Feels his breathing has improved.  Denies chest pain, abdominal pain.  Has lightheadedness on occasion.   Objective:   Filed Vitals:   06/30/15 0740 06/30/15 0800 06/30/15 0930 06/30/15 1000  BP: 68/43 87/58 117/53 124/66  Pulse:      Temp: 98.4 F (36.9 C)     TempSrc: Oral     Resp: 24 11 18 20   Height:      Weight:      SpO2: 97%       Wt Readings from Last 3 Encounters:  06/30/15 92.171 kg (203 lb 3.2 oz)    10/13/13 92.987 kg (205 lb)  08/18/13 94.802 kg (209 lb)     Intake/Output Summary (Last 24 hours) at 06/30/15 1037 Last data filed at 06/30/15 1000  Gross per 24 hour  Intake  811.6 ml  Output   1851 ml  Net -1039.4 ml    Exam  General: Well developed, well nourished, NAD  HEENT: NCAT, mucous membranes moist.  Cardiovascular: S1 S2 auscultated, RRR  Respiratory: Clear to auscultation bilaterally  Abdomen: Soft, obese, nontender, nondistended, + bowel sounds  Extremities: warm dry without cyanosis clubbing or edema  Neuro: AAOx3, nonfocal  Psych: Appropriate mood and affect, pleasant  Data Review   Micro Results Recent Results (from the past 240 hour(s))  MRSA PCR Screening     Status: None   Collection Time: 06/29/15  5:21 PM  Result Value Ref Range Status   MRSA by PCR NEGATIVE NEGATIVE Final    Comment:        The GeneXpert MRSA Assay (FDA approved for NASAL specimens only), is one component of a comprehensive MRSA colonization surveillance program. It is not intended to diagnose MRSA infection nor to guide or monitor treatment for MRSA infections.     Radiology Reports Dg Chest 2 View  06/25/2015   CLINICAL DATA:  Cough and congestion for 2 months, increasing shortness of breath, smoking history  EXAM: CHEST  2 VIEW  COMPARISON:  Portable chest x-ray of 11/29/2010 and two-view chest x-ray of 11/17/2010  FINDINGS: There are somewhat prominent interstitial markings with a tiny amount of fluid in the fissures on the lateral view. With cardiomegaly, this may indicate very early interstitial edema. A tiny pleural effusion cannot be excluded with some blunting of the costophrenic angle on the lateral view. No focal infiltrate is seen. No bony abnormality is noted.  IMPRESSION: Cardiomegaly and somewhat prominent interstitial markings may represent mild interstitial edema.   Electronically Signed   By: Dwyane Dee M.D.   On: 06/25/2015 16:14   Ct Angio Chest Pe  W/cm &/or Wo Cm  06/25/2015   CLINICAL DATA:  Short of breath for 2 months  EXAM: CT ANGIOGRAPHY CHEST WITH CONTRAST  TECHNIQUE: Multidetector CT imaging of the chest was performed using the standard protocol during bolus administration of intravenous contrast. Multiplanar CT image reconstructions and MIPs were obtained to evaluate the vascular anatomy.  CONTRAST:  OMNIPAQUE IOHEXOL 350 MG/ML SOLN  COMPARISON:  None.  FINDINGS: There are no filling defects in the pulmonary arterial tree to suggest acute  pulmonary thromboembolism.  Left ventricular hypertrophy. There is some enlargement of the left atrium.  Abnormal mediastinal adenopathy. 18 mm short axis diameter precarinal node. 11 mm paratracheal node on image 29. Other scattered smaller mediastinal nodes.  No pneumothorax.  No pleural effusion.  Clear lungs.  No vertebral compression deformity.  There is contrast reflux into the hepatic veins suggesting elevated right heart pressure.  Review of the MIP images confirms the above findings.  IMPRESSION: No evidence of acute pulmonary thromboembolism  There is abnormal mediastinal adenopathy. A lymphoproliferative disorder is not excluded. Correlate clinically as for the need for further imaging or three-month follow-up CT.   Electronically Signed   By: Jolaine Click M.D.   On: 06/25/2015 22:42    CBC  Recent Labs Lab 06/25/15 1918 06/26/15 0238 06/28/15 0402 06/29/15 0344 06/30/15 0400  WBC 10.7* 10.8* 9.4 10.1 9.8  HGB 15.6 15.0 15.4 16.3 15.7  HCT 46.2 44.2 46.0 48.5 46.6  PLT 241 217 229 235 230  MCV 93.9 94.6 93.9 94.4 94.0  MCH 31.7 32.1 31.4 31.7 31.7  MCHC 33.8 33.9 33.5 33.6 33.7  RDW 14.2 14.3 13.9 14.0 13.9    Chemistries   Recent Labs Lab 06/25/15 1918 06/26/15 0238 06/27/15 0310 06/28/15 0402 06/29/15 0344 06/30/15 0400  NA 135  --  139 135 138 138  K 4.2  --  4.3 3.9 4.6 4.1  CL 106  --  107 102 106 105  CO2 19*  --  GLUCOSE 96  --  116* 140* 107*  132*  BUN 15  --  CREATININE 1.23 1.12 1.11 1.12 1.06 1.22  CALCIUM 8.7*  --  8.7* 8.6* 9.0 8.8*  MG  --   --  2.1  --   --   --    ------------------------------------------------------------------------------------------------------------------ estimated creatinine clearance is 71.4 mL/min (by C-G formula based on Cr of 1.22). ------------------------------------------------------------------------------------------------------------------ No results for input(s): HGBA1C in the last 72 hours. ------------------------------------------------------------------------------------------------------------------ No results for input(s): CHOL, HDL, LDLCALC, TRIG, CHOLHDL, LDLDIRECT in the last 72 hours. ------------------------------------------------------------------------------------------------------------------ No results for input(s): TSH, T4TOTAL, T3FREE, THYROIDAB in the last 72 hours.  Invalid input(s): FREET3 ------------------------------------------------------------------------------------------------------------------ No results for input(s): VITAMINB12, FOLATE, FERRITIN, TIBC, IRON, RETICCTPCT in the last 72 hours.  Coagulation profile  Recent Labs Lab 06/28/15 0402  INR 1.28    No results for input(s): DDIMER in the last 72 hours.  Cardiac Enzymes  Recent Labs Lab 06/26/15 0238 06/26/15 0245 06/26/15 0740  TROPONINI 0.08* 0.08* 0.07*   ------------------------------------------------------------------------------------------------------------------ Invalid input(s): POCBNP    Alcario Tinkey D.O. on 06/30/2015 at 10:37 AM  Between 7am to 7pm - Pager - [phone]  After 7pm go to [email or web address] - password [email or web address]  And look for the night coverage person covering for me after hours  [facility] Office  [phone]

## 2015-06-30 NOTE — Progress Notes (Signed)
Advanced Heart Failure Rounding Note   Subjective:    Cath on 10/10 with minimal non-obstructive CAD. Low output on RHC with co-ox 46%. PCWP 27. Started on milrinone and IV lasix.   Diuresed well. Breathing better. Co-ox now 70%. Lightheaded at times. CVP 3. SBP 80-100  Objective:   Weight Range:  Vital Signs:   Temp:  [97.5 F (36.4 C)-98.8 F (37.1 C)] 98.4 F (36.9 C) (10/11 0740) Pulse Rate:  [88-113] 95 (10/10 2250) Resp:  [0-39] 24 (10/11 0740) BP: (68-139)/(43-102) 104/74 mmHg (10/11 0740) SpO2:  [93 %-100 %] 97 % (10/11 0740) Weight:  [92.171 kg (203 lb 3.2 oz)] 92.171 kg (203 lb 3.2 oz) (10/11 0341) Last BM Date: 06/29/15  Weight change: Filed Weights   06/28/15 0500 06/29/15 0500 06/30/15 0341  Weight: 93.033 kg (205 lb 1.6 oz) 92.171 kg (203 lb 3.2 oz) 92.171 kg (203 lb 3.2 oz)    Intake/Output:   Intake/Output Summary (Last 24 hours) at 06/30/15 0848 Last data filed at 06/30/15 0600  Gross per 24 hour  Intake 425.93 ml  Output   1851 ml  Net -1425.07 ml      Physical Exam: General: Sitting on side of bed. No resp difficulty HEENT: normal Neck: supple. JVP flat . Carotids 2+ bilat; no bruits. No lymphadenopathy or thryomegaly appreciated. Cor: PMI laterally displaced. Regular rate & rhythm. No rubs, gallops or murmurs. Lungs: clear Abdomen: Obese soft, nontender, nondistended. No hepatosplenomegaly. No bruits or masses. Good bowel sounds. Extremities: no cyanosis, clubbing, rash, edema Neuro: alert & orientedx3, cranial nerves grossly intact. moves all 4 extremities w/o difficulty. Affect pleasant  Telemetry: Sinus tach 100-110  Labs: Basic Metabolic Panel:  Recent Labs Lab 06/25/15 1918 06/26/15 0238 06/27/15 0310 06/28/15 0402 06/29/15 0344 06/30/15 0400  NA 135  --  139 135 138 138  K 4.2  --  4.3 3.9 4.6 4.1  CL 106  --  107 102 106 105  CO2 19*  --  GLUCOSE 96  --  116* 140* 107* 132*  BUN 15  --  CREATININE 1.23 1.12 1.11 1.12 1.06 1.22  CALCIUM 8.7*  --  8.7* 8.6* 9.0 8.8*  MG  --   --  2.1  --   --   --     Liver Function Tests: No results for input(s): AST, ALT, ALKPHOS, BILITOT, PROT, ALBUMIN in the last 168 hours. No results for input(s): LIPASE, AMYLASE in the last 168 hours. No results for input(s): AMMONIA in the last 168 hours.  CBC:  Recent Labs Lab 06/25/15 1918 06/26/15 0238 06/28/15 0402 06/29/15 0344 06/30/15 0400  WBC 10.7* 10.8* 9.4 10.1 9.8  HGB 15.6 15.0 15.4 16.3 15.7  HCT 46.2 44.2 46.0 48.5 46.6  MCV 93.9 94.6 93.9 94.4 94.0  PLT 241 217 229 235 230    Cardiac Enzymes:  Recent Labs Lab 06/25/15 2256 06/26/15 0238 06/26/15 0245 06/26/15 0740  TROPONINI 0.09* 0.08* 0.08* 0.07*    BNP: BNP (last 3 results)  Recent Labs  06/26/15 0238  BNP 678.7*    ProBNP (last 3 results) No results for input(s): PROBNP in the last 8760 hours.    Other results:  Imaging:  No results found.   Medications:     Scheduled Medications: . aspirin EC  81 mg Oral Daily  . atorvastatin  10 mg Oral q1800  . digoxin  0.25 mg Oral Daily  .  fluticasone  1 spray Each Nare Daily  . furosemide  80 mg Intravenous BID  . heparin  5,000 Units Subcutaneous 3 times per day  . losartan  25 mg Oral Daily  . sodium chloride  3 mL Intravenous Q12H  . spironolactone  12.5 mg Oral Daily     Infusions: . milrinone 0.25 mcg/kg/min (06/30/15 0600)     PRN Medications:  sodium chloride, acetaminophen, gi cocktail, morphine injection, ondansetron (ZOFRAN) IV, sodium chloride, zolpidem   Assessment:   1. Acute systolic HF. LVEF 15-20% -> cardiogenic shock    --milrinone started 10/10 2. NSVT 3. Morbid obesity 4. H/o sinus tachycardia HR 95-110 5. 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 CVP way down after just a couple of pounds off. LV likely very stiff. Will hold lasix. Cut  milrinone to 0.125. No room to titrate other meds yet. No b-blocker due to low output. Will plan cMRI prior to d/c.    Length of Stay: 1  Kriste Broman MD 06/30/2015, 8:48 AM  Advanced Heart Failure Team Pager [phone] (M-F; 7a - 4p)  Please contact [facility] for night-coverage after hours (4p -7a ) and weekends on [email or web address]

## 2015-07-01 LAB — BASIC METABOLIC PANEL
ANION GAP: 7 (ref 5–15)
Anion gap: 12 (ref 5–15)
BUN: 14 mg/dL (ref 6–20)
BUN: 14 mg/dL (ref 6–20)
CALCIUM: 9 mg/dL (ref 8.9–10.3)
CHLORIDE: 105 mmol/L (ref 101–111)
CO2: 21 mmol/L — AB (ref 22–32)
CO2: 24 mmol/L (ref 22–32)
CREATININE: 0.94 mg/dL (ref 0.61–1.24)
Calcium: 8.8 mg/dL — ABNORMAL LOW (ref 8.9–10.3)
Chloride: 104 mmol/L (ref 101–111)
Creatinine, Ser: 1.1 mg/dL (ref 0.61–1.24)
GFR calc Af Amer: >60 mL/min (ref >60)
GFR calc non Af Amer: >60 mL/min (ref >60)
GFR calc non Af Amer: >60 mL/min (ref >60)
Glucose, Bld: 112 mg/dL — ABNORMAL HIGH (ref 65–99)
Glucose, Bld: 98 mg/dL (ref 65–99)
POTASSIUM: 4.5 mmol/L (ref 3.5–5.1)
Potassium: 4.5 mmol/L (ref 3.5–5.1)
SODIUM: 137 mmol/L (ref 135–145)
SODIUM: 136 mmol/L (ref 135–145)

## 2015-07-01 LAB — CARBOXYHEMOGLOBIN
CARBOXYHEMOGLOBIN: 1.4 % (ref 0.5–1.5)
Carboxyhemoglobin: 1.3 % (ref 0.5–1.5)
METHEMOGLOBIN: 0.8 % (ref 0.0–1.5)
Methemoglobin: 0.9 % (ref 0.0–1.5)
O2 Saturation: 59.2 %
O2 Saturation: 66.4 %
TOTAL HEMOGLOBIN: 15.4 g/dL (ref 13.5–18.0)
TOTAL HEMOGLOBIN: 15.4 g/dL (ref 13.5–18.0)

## 2015-07-01 LAB — MAGNESIUM: MAGNESIUM: 2.3 mg/dL (ref 1.7–2.4)

## 2015-07-01 LAB — FERRITIN: FERRITIN: 115 ng/mL (ref 24–336)

## 2015-07-01 MED ORDER — LOSARTAN POTASSIUM 25 MG PO TABS
25.0000 mg | ORAL_TABLET | Freq: Two times a day (BID) | ORAL | Status: DC
  Administered 2015-07-01 – 2015-07-03 (×4): 25 mg via ORAL
  Filled 2015-07-01 (×4): qty 1

## 2015-07-01 MED ORDER — AMIODARONE HCL IN DEXTROSE 360-4.14 MG/200ML-% IV SOLN
60.0000 mg/h | INTRAVENOUS | Status: AC
  Administered 2015-07-01 – 2015-07-02 (×2): 60 mg/h via INTRAVENOUS
  Filled 2015-07-01 (×2): qty 200

## 2015-07-01 MED ORDER — AMIODARONE IV BOLUS ONLY 150 MG/100ML
150.0000 mg | Freq: Once | INTRAVENOUS | Status: AC
  Administered 2015-07-01: 150 mg via INTRAVENOUS
  Filled 2015-07-01: qty 100

## 2015-07-01 MED ORDER — HEPARIN BOLUS VIA INFUSION
4000.0000 [IU] | Freq: Once | INTRAVENOUS | Status: AC
  Administered 2015-07-01: 4000 [IU] via INTRAVENOUS
  Filled 2015-07-01: qty 4000

## 2015-07-01 MED ORDER — AMIODARONE HCL IN DEXTROSE 360-4.14 MG/200ML-% IV SOLN
30.0000 mg/h | INTRAVENOUS | Status: DC
  Administered 2015-07-02 – 2015-07-03 (×2): 30 mg/h via INTRAVENOUS
  Filled 2015-07-01 (×2): qty 200

## 2015-07-01 MED ORDER — HEPARIN (PORCINE) IN NACL 100-0.45 UNIT/ML-% IJ SOLN
1200.0000 [IU]/h | INTRAMUSCULAR | Status: DC
  Administered 2015-07-01: 1200 [IU]/h via INTRAVENOUS
  Filled 2015-07-01: qty 250

## 2015-07-01 NOTE — Progress Notes (Signed)
Pt went into ST 160s at 2049, Pt BP 134/97, asymptomatic, Patient then went back into normal sinus at 2115, at 2120 patient went into Afib RVR rate 130s-140s still asymptomatic. EKG done, called Fellow Balfour, orders to obtain BMET and magnesium, and to give 150 bolus of amiodarone. Patient is resting comfortably in bed. Will continue to monitor closely.

## 2015-07-01 NOTE — Progress Notes (Signed)
  Amiodarone Drug - Drug Interaction Consult Note  Recommendations: CONSIDER HALVING DIGOXIN (started this admission)  Amiodarone is metabolized by the cytochrome P450 system and therefore has the potential to cause many drug interactions. Amiodarone has an average plasma half-life of 50 days (range 20 to 100 days).   There is potential for drug interactions to occur several weeks or months after stopping treatment and the onset of drug interactions may be slow after initiating amiodarone.   [x]  Statins (ATORVASTATIN): Increased risk of myopathy. Simvastatin- restrict dose to 20mg  daily. Other statins: counsel patients to report any muscle pain or weakness immediately.  []  Anticoagulants: Amiodarone can increase anticoagulant effect. Consider warfarin dose reduction. Patients should be monitored closely and the dose of anticoagulant altered accordingly, remembering that amiodarone levels take several weeks to stabilize.  []  Antiepileptics: Amiodarone can increase plasma concentration of phenytoin, the dose should be reduced. Note that small changes in phenytoin dose can result in large changes in levels. Monitor patient and counsel on signs of toxicity.  []  Beta blockers: increased risk of bradycardia, AV block and myocardial depression. Sotalol - avoid concomitant use.  []   Calcium channel blockers (diltiazem and verapamil): increased risk of bradycardia, AV block and myocardial depression.  []   Cyclosporine: Amiodarone increases levels of cyclosporine. Reduced dose of cyclosporine is recommended.  [x]  Digoxin dose should be halved when amiodarone is started.  []  Diuretics: increased risk of cardiotoxicity if hypokalemia occurs.  []  Oral hypoglycemic agents (glyburide, glipizide, glimepiride): increased risk of hypoglycemia. Patient's glucose levels should be monitored closely when initiating amiodarone therapy.   []  Drugs that prolong the QT interval:  Torsades de pointes risk may be  increased with concurrent use - avoid if possible.  Monitor QTc, also keep magnesium/potassium WNL if concurrent therapy can't be avoided. Marland Kitchen Antibiotics: e.g. fluoroquinolones, erythromycin. . Antiarrhythmics: e.g. quinidine, procainamide, disopyramide, sotalol. . Antipsychotics: e.g. phenothiazines, haloperidol.  . Lithium, tricyclic antidepressants, and methadone.  Thank You,  Vernard Gambles, PharmD, BCPS  07/01/2015 10:59 PM

## 2015-07-01 NOTE — Progress Notes (Signed)
ANTICOAGULATION CONSULT NOTE - Initial Consult  Pharmacy Consult for heparin Indication: atrial fibrillation  Allergies  Allergen Reactions  . Pseudoephedrine     REACTION: Elevated HR    Patient Measurements: Height: 5\' 5"  (165.1 cm) Weight: 201 lb 11.2 oz (91.491 kg) IBW/kg (Calculated) : 61.5 Heparin Dosing Weight: 82kg  Vital Signs: Temp: 98.5 F (36.9 C) (10/12 2033) Temp Source: Oral (10/12 2033) BP: 145/99 mmHg (10/12 2220) Pulse Rate: 99 (10/12 2033)  Labs:  Recent Labs  06/29/15 0344 06/30/15 0400 07/01/15 0330 07/01/15 2210  HGB 16.3 15.7  --   --   HCT 48.5 46.6  --   --   PLT 235 230  --   --   CREATININE 1.06 1.22 0.94 1.10    Estimated Creatinine Clearance: 78.9 mL/min (by C-G formula based on Cr of 1.1).   Medical History: Past Medical History  Diagnosis Date  . Diverticulitis     ACTIVE WITH ABCESS    Medications:  Prescriptions prior to admission  Medication Sig Dispense Refill Last Dose  . fluticasone (FLONASE) 50 MCG/ACT nasal spray Place 1 spray into both nostrils daily.   06/24/2015 at Unknown time  . Multiple Vitamins-Minerals (MULTIVITAL PO) Take by mouth daily. MENS MEGA MULTIVIT.    06/25/2015 at Unknown time  . vitamin B-12 (CYANOCOBALAMIN) 500 MCG tablet Take 500 mcg by mouth daily.   06/25/2015 at Unknown time  . HYDROcodone-homatropine (HYCODAN) 5-1.5 MG/5ML syrup Take 5 mLs by mouth every 6 (six) hours as needed for cough. (Patient not taking: Reported on 06/25/2015) 120 mL 0 Not Taking at Unknown time  . ipratropium (ATROVENT) 0.06 % nasal spray Place 2 sprays into the nose 3 (three) times daily. (Patient not taking: Reported on 06/25/2015) 15 mL 0 Not Taking at Unknown time   Scheduled:  . aspirin EC  81 mg Oral Daily  . atorvastatin  10 mg Oral q1800  . digoxin  0.25 mg Oral Daily  . fluticasone  1 spray Each Nare Daily  . losartan  25 mg Oral BID  . spironolactone  12.5 mg Oral Daily   Infusions:  . amiodarone 60 mg/hr  (07/01/15 2300)   Followed by  . [START ON 07/02/2015] amiodarone      Assessment: 54yo male admitted 10/6 w/ SOB, had cath 10/10, cont'd admission for HF, now in Afib w/ RVR, started on amio, to begin heparin.  Goal of Therapy:  Heparin level 0.3-0.7 units/ml Monitor platelets by anticoagulation protocol: Yes   Plan:  Will give heparin 4000 units IV bolus x1 followed by gtt at 1200 units/hr and monitor heparin levels and CBC.  Vernard Gambles, PharmD, BCPS  07/01/2015,11:27 PM

## 2015-07-01 NOTE — Progress Notes (Signed)
Advanced Heart Failure Rounding Note   Subjective:    Cath on 10/10 with minimal non-obstructive CAD. Low output on RHC with co-ox 46%.  Started on milrinone and IV lasix.   Yesterday milrinone was cut back to 0.125 mcg. CO-OX today 59%. CVP 3.    Overall feeling much better. Denies SOB/Orthopnea.    Objective:   Weight Range:  Vital Signs:   Temp:  [97.5 F (36.4 C)-98.8 F (37.1 C)] 98.4 F (36.9 C) (10/11 0740) Pulse Rate:  [88-113] 95 (10/10 2250) Resp:  [0-39] 24 (10/11 0740) BP: (68-139)/(43-102) 104/74 mmHg (10/11 0740) SpO2:  [93 %-100 %] 97 % (10/11 0740) Weight:  [92.171 kg (203 lb 3.2 oz)] 92.171 kg (203 lb 3.2 oz) (10/11 0341) Last BM Date: 06/30/15  Weight change: Filed Weights   06/29/15 0500 06/30/15 0341 07/01/15 0429  Weight: 203 lb 3.2 oz (92.171 kg) 203 lb 3.2 oz (92.171 kg) 201 lb 11.2 oz (91.491 kg)    Intake/Output:   Intake/Output Summary (Last 24 hours) at 07/01/15 1115 Last data filed at 07/01/15 0700  Gross per 24 hour  Intake    870 ml  Output   1650 ml  Net   -780 ml      Physical Exam: CVP 3  General: Sitting in chair. No resp difficulty HEENT: normal Neck: supple. JVP flat . Carotids 2+ bilat; no bruits. No lymphadenopathy or thryomegaly appreciated. Cor: PMI laterally displaced. Regular rate & rhythm. No rubs, gallops or murmurs. Lungs: clear Abdomen: Obese soft, nontender, nondistended. No hepatosplenomegaly. No bruits or masses. Good bowel sounds. Extremities: no cyanosis, clubbing, rash, edema Neuro: alert & orientedx3, cranial nerves grossly intact. moves all 4 extremities w/o difficulty. Affect pleasant  Telemetry: Sinus tach 100-110  Labs: Basic Metabolic Panel:  Recent Labs Lab 06/27/15 0310 06/28/15 0402 06/29/15 0344 06/30/15 0400 07/01/15 0330  NA 139 135 138 138 137  K 4.3 3.9 4.6 4.1 4.5  CL 107 102 106 105 104  CO2 22 23 23 25  21*  GLUCOSE 116* 140* 107* 132* 112*  BUN 16 18 20 20 14     CREATININE 1.11 1.12 1.06 1.22 0.94  CALCIUM 8.7* 8.6* 9.0 8.8* 9.0  MG 2.1  --   --   --   --     Liver Function Tests: No results for input(s): AST, ALT, ALKPHOS, BILITOT, PROT, ALBUMIN in the last 168 hours. No results for input(s): LIPASE, AMYLASE in the last 168 hours. No results for input(s): AMMONIA in the last 168 hours.  CBC:  Recent Labs Lab 06/25/15 1918 06/26/15 0238 06/28/15 0402 06/29/15 0344 06/30/15 0400  WBC 10.7* 10.8* 9.4 10.1 9.8  HGB 15.6 15.0 15.4 16.3 15.7  HCT 46.2 44.2 46.0 48.5 46.6  MCV 93.9 94.6 93.9 94.4 94.0  PLT 241 217 229 235 230    Cardiac Enzymes:  Recent Labs Lab 06/25/15 2256 06/26/15 0238 06/26/15 0245 06/26/15 0740  TROPONINI 0.09* 0.08* 0.08* 0.07*    BNP: BNP (last 3 results)  Recent Labs  06/26/15 0238  BNP 678.7*    ProBNP (last 3 results) No results for input(s): PROBNP in the last 8760 hours.    Other results:  Imaging: Mr Card Morphology Wo/w Cm  06/30/2015  CLINICAL DATA:  CHF/Cardiomyopathy EXAM: CARDIAC MRI TECHNIQUE: The patient was scanned on a 1.5 Tesla GE magnet. A dedicated cardiac coil was used. Functional imaging was done using Fiesta sequences. 2,3, and 4 chamber views were done to assess for RWMA's.  Modified Simpson's rule using a short axis stack was used to calculate an ejection fraction on a dedicated work [employer]. The patient received 30 cc of Multihance. After 10 minutes inversion recovery sequences were used to assess for infiltration and scar tissue. CONTRAST:  30 cc Multihance FINDINGS: There was severe LAE. There was mild RAE. RV was normal in size and function. There was no ASD/VSD. There was a trivial pericardial effusion. The LV was severely dilated with diffuse hypokinesis There was moderate appearing MR. Aortic valve appeared normal Quantitative EF was 19% (EDV 222cc, ESV 180cc, SV 42 cc) Delayed enhancement images only showed a small area of mid myocardial uptake in  the basal and mid anterior septum IMPRESSION: 1) Severe LVE with diffuse hypokinesis EF 19% 2) Small area of mid myocardial hyperenhancement in the mid and basal anterior septum. No evidence of ischemic cardiomyopathy 3) Severe LAE 4) Moderate appearing MR 5) Mild RAE 6) Trivial pericardial effusion Charlton Haws Electronically Signed   By: Charlton Haws M.D.   On: 06/30/2015 17:31     Medications:     Scheduled Medications: . aspirin EC  81 mg Oral Daily  . atorvastatin  10 mg Oral q1800  . digoxin  0.25 mg Oral Daily  . fluticasone  1 spray Each Nare Daily  . heparin  5,000 Units Subcutaneous 3 times per day  . losartan  25 mg Oral Daily  . sodium chloride  3 mL Intravenous Q12H  . spironolactone  12.5 mg Oral Daily    Infusions: . milrinone 0.125 mcg/kg/min (06/30/15 2000)    PRN Medications: sodium chloride, acetaminophen, gi cocktail, morphine injection, ondansetron (ZOFRAN) IV, sodium chloride, zolpidem   Assessment:   1. Acute systolic HF. LVEF 15-20% -> cardiogenic shock    --milrinone started 10/10 2. NSVT 3. Morbid obesity 4. H/o sinus tachycardia  5. 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 Euvolemic. CVP 3-4. No lasix.  CO-OX 59%. Stop milrinone. Repeat CO-OX at 2:00. Continue digoxin  0.25 mg daily. No bb for now. Increase losartan 25 twice a day. (no entresto with low CVP can think of as an outpatient) Continue spiro 12.5 mg daily.    06/30/15 cMRI- NICM. EF 19%. Mod MR. RV normal. No infiltrative process   Length of Stay: 2  Amy Clegg NP-C  07/01/2015, 11:15 AM  Advanced Heart Failure Team Pager [phone] (M-F; 7a - 4p)  Please contact [facility] for night-coverage after hours (4p -7a ) and weekends on [email or web address]  Patient seen and examined with Tonye Becket, NP. We discussed all aspects of the encounter. I agree with the assessment and plan as stated above.   He is improved. Can stop milrinone. Agree with  increasing losartan. cMRI as above. Keep in SDU to follow CVP and co-ox off milrinone.   Selenia Mihok,MD 2:26 PM

## 2015-07-01 NOTE — Progress Notes (Signed)
CARDIAC REHAB PHASE I   PRE:  Rate/Rhythm: 121 ST PACs  BP:  Supine:   Sitting: 145/107  Standing:    SaO2:   MODE:  Ambulation: 740 ft   POST:  Rate/Rhythm: 130 ST PACs  BP:  Supine:   Sitting: 123/95  Standing:    SaO2: 95%RA 1000-1100 Pt walked 740 ft on RA with steady gait. No complaints. Reviewed CHF booklet zones and discussed when to call MD, daily weight importance, low sodium diet, and 2L fluid restriction. Wrote down how to view CHF video. Pt likes to walk so walking instructions given. Discussed smoking cessation and pt stated he did not think he would have difficulty quitting cold [place] as he has done so three times sometimes staying quit as long as 9 years. Gave tobacco cessation handout. Discussed CRP 2 and pt would like to attend. Will refer to [place] but update staff if pt goes home on milrinone as this would affect being able to do. Pt very receptive to ed.   Luetta Nutting, RN BSN  07/01/2015 10:58 AM

## 2015-07-02 DIAGNOSIS — I480 Paroxysmal atrial fibrillation: Secondary | ICD-10-CM

## 2015-07-02 LAB — CBC
HCT: 46 % (ref 39.0–52.0)
HEMOGLOBIN: 15.8 g/dL (ref 13.0–17.0)
MCH: 32 pg (ref 26.0–34.0)
MCHC: 34.3 g/dL (ref 30.0–36.0)
MCV: 93.3 fL (ref 78.0–100.0)
Platelets: 224 10*3/uL (ref 150–400)
RBC: 4.93 MIL/uL (ref 4.22–5.81)
RDW: 13.8 % (ref 11.5–15.5)
WBC: 11.3 10*3/uL — AB (ref 4.0–10.5)

## 2015-07-02 LAB — BASIC METABOLIC PANEL
ANION GAP: 8 (ref 5–15)
BUN: 11 mg/dL (ref 6–20)
CHLORIDE: 103 mmol/L (ref 101–111)
CO2: 23 mmol/L (ref 22–32)
Calcium: 8.6 mg/dL — ABNORMAL LOW (ref 8.9–10.3)
Creatinine, Ser: 0.98 mg/dL (ref 0.61–1.24)
GFR calc non Af Amer: >60 mL/min (ref >60)
Glucose, Bld: 185 mg/dL — ABNORMAL HIGH (ref 65–99)
Potassium: 4.2 mmol/L (ref 3.5–5.1)
Sodium: 134 mmol/L — ABNORMAL LOW (ref 135–145)

## 2015-07-02 LAB — CARBOXYHEMOGLOBIN
Carboxyhemoglobin: 1.2 % (ref 0.5–1.5)
Methemoglobin: 0.7 % (ref 0.0–1.5)
O2 SAT: 70.3 %
Total hemoglobin: 16 g/dL (ref 13.5–18.0)

## 2015-07-02 LAB — PROTIME-INR
INR: 1.28 (ref 0.00–1.49)
Prothrombin Time: 16.1 seconds — ABNORMAL HIGH (ref 11.6–15.2)

## 2015-07-02 LAB — HEPARIN LEVEL (UNFRACTIONATED)
HEPARIN UNFRACTIONATED: 0.45 [IU]/mL (ref 0.30–0.70)
HEPARIN UNFRACTIONATED: 0.37 [IU]/mL (ref 0.30–0.70)

## 2015-07-02 MED ORDER — WARFARIN SODIUM 7.5 MG PO TABS
7.5000 mg | ORAL_TABLET | Freq: Once | ORAL | Status: AC
  Administered 2015-07-02: 7.5 mg via ORAL
  Filled 2015-07-02: qty 1

## 2015-07-02 MED ORDER — HEPARIN (PORCINE) IN NACL 100-0.45 UNIT/ML-% IJ SOLN
1200.0000 [IU]/h | INTRAMUSCULAR | Status: DC
  Administered 2015-07-02 (×2): 1200 [IU]/h via INTRAVENOUS
  Filled 2015-07-02: qty 250

## 2015-07-02 MED ORDER — FUROSEMIDE 20 MG PO TABS
20.0000 mg | ORAL_TABLET | Freq: Every day | ORAL | Status: DC
  Administered 2015-07-03: 20 mg via ORAL
  Filled 2015-07-02: qty 1

## 2015-07-02 MED ORDER — PATIENT'S GUIDE TO USING COUMADIN BOOK
Freq: Once | Status: AC
  Administered 2015-07-02: 18:00:00
  Filled 2015-07-02: qty 1

## 2015-07-02 MED ORDER — APIXABAN 5 MG PO TABS
5.0000 mg | ORAL_TABLET | Freq: Two times a day (BID) | ORAL | Status: DC
  Filled 2015-07-02: qty 1

## 2015-07-02 MED ORDER — WARFARIN - PHARMACIST DOSING INPATIENT: Freq: Every day | Status: DC

## 2015-07-02 MED ORDER — SPIRONOLACTONE 25 MG PO TABS
25.0000 mg | ORAL_TABLET | Freq: Every day | ORAL | Status: DC
  Administered 2015-07-03: 25 mg via ORAL
  Filled 2015-07-02: qty 1

## 2015-07-02 NOTE — Progress Notes (Signed)
CARDIAC REHAB PHASE I   PRE:  Rate/Rhythm: 100 SR  BP:  Supine:   Sitting: 116/72  Standing:    SaO2: 93%RA  MODE:  Ambulation: 1130 ft   POST:  Rate/Rhythm: 112 ST  BP:  Supine:   Sitting: 138/85  Standing:    SaO2: 97%RA 1410-1452 Pt walked 1130 ft stopping twice to look outside. Tolerated well. Remained in sinus. To recliner after walk. No DOE.   Luetta Nutting, RN BSN  07/02/2015 2:50 PM

## 2015-07-02 NOTE — Care Management Note (Signed)
Case Management Note  Patient Details  Name: [name] MRN: [id number] Date of Birth: 1961-02-16  Subjective/Objective:       Adm w tachycardia             Action/Plan: lives w fam   Expected Discharge Date:                  Expected Discharge Plan:  Home/Self Care  In-House Referral:     Discharge planning Services  CM Consult, Medication Assistance  Post Acute Care Choice:    Choice offered to:     DME Arranged:    DME Agency:     HH Arranged:    HH Agency:     Status of Service:     Medicare Important Message Given:    Date Medicare IM Given:    Medicare IM give by:    Date Additional Medicare IM Given:    Additional Medicare Important Message give by:     If discussed at Long Length of Stay Meetings, dates discussed:    Additional Comments: gave pt eliquis 30day free and copay assist card. Pt states maynot take eliquis. Alerted frank pharm that pt not sure he want eliquis but did leave cards w pt.  Hanley Hays, RN 07/02/2015, 10:09 AM

## 2015-07-02 NOTE — Progress Notes (Signed)
ANTICOAGULATION CONSULT NOTE - Follow Up Consult  Pharmacy Consult for heparin Indication: atrial fibrillation   Labs:  Recent Labs  06/30/15 0400 07/01/15 0330 07/01/15 2210 07/02/15 0500 07/02/15 0630  HGB 15.7  --   --  15.8  --   HCT 46.6  --   --  46.0  --   PLT 230  --   --  224  --   HEPARINUNFRC  --   --   --   --  0.45  CREATININE 1.22 0.94 1.10 0.98  --     Assessment/Plan:  54yo male therapeutic on heparin with initial dosing for Afib; now in NSR after a few hours of ST. Will continue gtt at current rate and confirm stable with additional level.   Vernard Gambles, PharmD, BCPS  07/02/2015,7:10 AM

## 2015-07-02 NOTE — Progress Notes (Signed)
S: Notified pt in Afib with RVR HR 120-140s persistent.  Patient asymptomatic.  Hx of SVT, no known AFib.  A: Afib/flutter with RVR P: IV Amiodarone bolus followed by infusion    Heparin gtt   BMP, Mg checked.

## 2015-07-02 NOTE — Progress Notes (Signed)
Advanced Heart Failure Rounding Note   Subjective:    Cath on 10/10 with minimal non-obstructive CAD. Low output on RHC with co-ox 46%.  Started on milrinone and IV lasix.   Yesterday milrinone was stopped. CO-OX today 70%. Over night developed A fib RVR. Loaded on amio drip and heparin. Converted to Sinus Tach today.   Overall feeling ok. Refuses statin and eliquis. Denies SOB/Orthopnea.    Objective:   Weight Range:  Vital Signs:   Temp:  [97.5 F (36.4 C)-98.8 F (37.1 C)] 98.4 F (36.9 C) (10/11 0740) Pulse Rate:  [88-113] 95 (10/10 2250) Resp:  [0-39] 24 (10/11 0740) BP: (68-139)/(43-102) 104/74 mmHg (10/11 0740) SpO2:  [93 %-100 %] 97 % (10/11 0740) Weight:  [92.171 kg (203 lb 3.2 oz)] 92.171 kg (203 lb 3.2 oz) (10/11 0341) Last BM Date: 07/02/15  Weight change: Filed Weights   06/30/15 0341 07/01/15 0429 07/02/15 0500  Weight: 203 lb 3.2 oz (92.171 kg) 201 lb 11.2 oz (91.491 kg) 201 lb 1 oz (91.2 kg)    Intake/Output:   Intake/Output Summary (Last 24 hours) at 07/02/15 0916 Last data filed at 07/02/15 0842  Gross per 24 hour  Intake 689.63 ml  Output   2100 ml  Net -1410.37 ml      Physical Exam: CVP 6 General: Sitting in chair. No resp difficulty HEENT: normal Neck: supple. JVP flat . Carotids 2+ bilat; no bruits. No lymphadenopathy or thryomegaly appreciated. Cor: PMI laterally displaced. Regular rate & rhythm. No rubs, gallops or murmurs. Lungs: clear Abdomen: Obese soft, nontender, nondistended. No hepatosplenomegaly. No bruits or masses. Good bowel sounds. Extremities: no cyanosis, clubbing, rash, edema Neuro: alert & orientedx3, cranial nerves grossly intact. moves all 4 extremities w/o difficulty. Affect pleasant  Telemetry: Sinus tach 100-110  Labs: Basic Metabolic Panel:  Recent Labs Lab 06/27/15 0310  06/29/15 0344 06/30/15 0400 07/01/15 0330 07/01/15 2210 07/02/15 0500  NA 139  < > 138 138 137 136 134*  K 4.3  < > 4.6 4.1  4.5 4.5 4.2  CL 107  < > 106 105 104 105 103  CO2 22  < > 23 25 21* 24 23  GLUCOSE 116*  < > 107* 132* 112* 98 185*  BUN 16  < > 20 20 14 14 11   CREATININE 1.11  < > 1.06 1.22 0.94 1.10 0.98  CALCIUM 8.7*  < > 9.0 8.8* 9.0 8.8* 8.6*  MG 2.1  --   --   --   --  2.3  --   < > = values in this interval not displayed.  Liver Function Tests: No results for input(s): AST, ALT, ALKPHOS, BILITOT, PROT, ALBUMIN in the last 168 hours. No results for input(s): LIPASE, AMYLASE in the last 168 hours. No results for input(s): AMMONIA in the last 168 hours.  CBC:  Recent Labs Lab 06/26/15 0238 06/28/15 0402 06/29/15 0344 06/30/15 0400 07/02/15 0500  WBC 10.8* 9.4 10.1 9.8 11.3*  HGB 15.0 15.4 16.3 15.7 15.8  HCT 44.2 46.0 48.5 46.6 46.0  MCV 94.6 93.9 94.4 94.0 93.3  PLT 217 229 235 230 224    Cardiac Enzymes:  Recent Labs Lab 06/25/15 2256 06/26/15 0238 06/26/15 0245 06/26/15 0740  TROPONINI 0.09* 0.08* 0.08* 0.07*    BNP: BNP (last 3 results)  Recent Labs  06/26/15 0238  BNP 678.7*    ProBNP (last 3 results) No results for input(s): PROBNP in the last 8760 hours.    Other  results:  Imaging: Mr Card Morphology Wo/w Cm  06/30/2015  CLINICAL DATA:  CHF/Cardiomyopathy EXAM: CARDIAC MRI TECHNIQUE: The patient was scanned on a 1.5 Tesla GE magnet. A dedicated cardiac coil was used. Functional imaging was done using Fiesta sequences. 2,3, and 4 chamber views were done to assess for RWMA's. Modified Simpson's rule using a short axis stack was used to calculate an ejection fraction on a dedicated work [employer]. The patient received 30 cc of Multihance. After 10 minutes inversion recovery sequences were used to assess for infiltration and scar tissue. CONTRAST:  30 cc Multihance FINDINGS: There was severe LAE. There was mild RAE. RV was normal in size and function. There was no ASD/VSD. There was a trivial pericardial effusion. The LV was severely dilated  with diffuse hypokinesis There was moderate appearing MR. Aortic valve appeared normal Quantitative EF was 19% (EDV 222cc, ESV 180cc, SV 42 cc) Delayed enhancement images only showed a small area of mid myocardial uptake in the basal and mid anterior septum IMPRESSION: 1) Severe LVE with diffuse hypokinesis EF 19% 2) Small area of mid myocardial hyperenhancement in the mid and basal anterior septum. No evidence of ischemic cardiomyopathy 3) Severe LAE 4) Moderate appearing MR 5) Mild RAE 6) Trivial pericardial effusion Charlton Haws Electronically Signed   By: Charlton Haws M.D.   On: 06/30/2015 17:31     Medications:     Scheduled Medications: . aspirin EC  81 mg Oral Daily  . atorvastatin  10 mg Oral q1800  . digoxin  0.25 mg Oral Daily  . fluticasone  1 spray Each Nare Daily  . losartan  25 mg Oral BID  . spironolactone  12.5 mg Oral Daily    Infusions: . amiodarone 30 mg/hr (07/02/15 0500)  . heparin 1,200 Units/hr (07/01/15 2346)    PRN Medications: sodium chloride, gi cocktail, morphine injection, ondansetron (ZOFRAN) IV, zolpidem   Assessment:   1. Acute systolic HF. LVEF 15-20% -> cardiogenic shock    --milrinone started 10/10 2. NSVT 3. Morbid obesity 4. H/o sinus tachycardia  5. 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 A fib RVR 10/13  Plan/Discussion:   Over night developed A fib  RVR. Started on amiodarone  + Heparin. Chemically converted to NST. Continue IV amio and transition to po tomorrow. Would like to use eliquis however he refuses. He prefers coumadin. Continue heparin and bridge to coumadin.    CO-OX stable 70%. Appears euvolemic. CVP 6. Increase spiro to  daily. For home will need lasix 20 mg daily and start tomorrow.  No bb for now. Continue dig 0.25 mg daily.  Continue losartan 25 mg twice a day. Renal function stable.    Refuses statin  06/30/15 cMRI- NICM. EF 19%. Mod MR. RV normal. No infiltrative process  Possible d/c tomorrow.  Length  of Stay: 3  Amy Clegg NP-C  07/02/2015, 9:16 AM  Advanced Heart Failure Team Pager [phone] (M-F; 7a - 4p)  Please contact [facility] for night-coverage after hours (4p -7a ) and weekends on [email or web address]  Patient seen and examined with Tonye Becket, NP. We discussed all aspects of the encounter. I agree with the assessment and plan as stated above.   Developed AF overnight but now back in NSR on amio. Volume status and hemodynamics much improved. Agree with HF meds as above.Refuses DAOC and statin. Ok with coumadin. Possibly home in am. MRI results reviewed with him.   Bensimhon, Daniel,MD 5:17 PM

## 2015-07-02 NOTE — Progress Notes (Signed)
ANTICOAGULATION CONSULT NOTE   Pharmacy Consult for heparin>>warfarin Indication: atrial fibrillation  Allergies  Allergen Reactions  . Pseudoephedrine     REACTION: Elevated HR    Patient Measurements: Height: 5\' 5"  (165.1 cm) Weight: 201 lb 1 oz (91.2 kg) IBW/kg (Calculated) : 61.5 Heparin Dosing Weight: 82kg  Vital Signs: Temp: 97.7 F (36.5 C) (10/13 1055) Temp Source: Oral (10/13 1055) BP: 138/93 mmHg (10/13 1055) Pulse Rate: 97 (10/13 1055)  Labs:  Recent Labs  06/30/15 0400 07/01/15 0330 07/01/15 2210 07/02/15 0500 07/02/15 0630  HGB 15.7  --   --  15.8  --   HCT 46.6  --   --  46.0  --   PLT 230  --   --  224  --   HEPARINUNFRC  --   --   --   --  0.45  CREATININE 1.22 0.94 1.10 0.98  --     Estimated Creatinine Clearance: 88.4 mL/min (by C-G formula based on Cr of 0.98).   Medical History: Past Medical History  Diagnosis Date  . Diverticulitis     ACTIVE WITH ABCESS    Scheduled:  . aspirin EC  81 mg Oral Daily  . atorvastatin  10 mg Oral q1800  . digoxin  0.25 mg Oral Daily  . fluticasone  1 spray Each Nare Daily  . losartan  25 mg Oral BID  . patient's guide to using coumadin book   Does not apply Once  . [START ON 07/03/2015] spironolactone  25 mg Oral Daily  . warfarin  7.5 mg Oral ONCE-1800  . Warfarin - Pharmacist Dosing Inpatient   Does not apply q1800   Infusions:  . amiodarone 30 mg/hr (07/02/15 0500)  . heparin      Assessment: 54yo male admitted 10/6 w/ SOB, had cath 10/10, cont'd admission for HF, episode of Afib w/ RVR overnight and started on amio and heparin.  Long discussion with patient regarding anticoagulation. Originally ordered apixaban but upon discussion with patient he is very averse to taking non-reversible anticoagulation (apix/rivaroxaban). He is ok with warfarin and seemed fairly familiar with side effects and monitoring. Information was re-explained in more depth. His is accepting of lab checks. He has a picc  so decided on continuing IV heparin and amio another day then likely switch to po amio and lovenox bridge.  Heparin was never stopped by nursing so there was no interruption in therapy. Baseline INR was 1.28 on 10/9, will recheck today prior to warfarin dose. Watch INR on concomitant amiodarone   Also of note during discussion with patient, he is unwilling to take any statins, he has personal/family anecdotal instances of adverse effects of this class of medication and does not wish to try the medications for now.  This patients CHA2DS2-VASc Score and unadjusted Ischemic Stroke Rate (% per year) is equal to 2.2 % stroke rate/year from a score of 2  Above score calculated as 1 point each if present [CHF, HTN, DM, Vascular=MI/PAD/Aortic Plaque, Age if 65-74, or Male] Above score calculated as 2 points each if present [Age > 75, or Stroke/TIA/TE]   Goal of Therapy: . INR goal 2-3 Heparin level 0.3-0.7 units/ml Monitor platelets by anticoagulation protocol: Yes   Plan:  Continue heparin at 1200/hr Start warfarin 7.5mg  tonight Daily INR - watch with amio Follow up transition to lovenox prior to d/c  Sheppard Coil PharmD., BCPS Clinical Pharmacist Pager [phone] 07/02/2015 11:33 AM

## 2015-07-02 NOTE — Discharge Instructions (Signed)
°Information on my medicine - Coumadin®   (Warfarin) ° °This medication education was reviewed with me or my healthcare representative as part of my discharge preparation.  The pharmacist that spoke with me during my hospital stay was:  Evynn Boutelle Rhea, RPH ° °Why was Coumadin prescribed for you? °Coumadin was prescribed for you because you have a blood clot or a medical condition that can cause an increased risk of forming blood clots. Blood clots can cause serious health problems by blocking the flow of blood to the heart, lung, or brain. Coumadin can prevent harmful blood clots from forming. °As a reminder your indication for Coumadin is:   Stroke Prevention Because Of Atrial Fibrillation ° °What test will check on my response to Coumadin? °While on Coumadin (warfarin) you will need to have an INR test regularly to ensure that your dose is keeping you in the desired range. The INR (international normalized ratio) number is calculated from the result of the laboratory test called prothrombin time (PT). ° °If an INR APPOINTMENT HAS NOT ALREADY BEEN MADE FOR YOU please schedule an appointment to have this lab work done by your health care provider within 7 days. °Your INR goal is usually a number between:  2 to 3 or your provider may give you a more narrow range like 2-2.5.  Ask your health care provider during an office visit what your goal INR is. ° °What  do you need to  know  About  COUMADIN? °Take Coumadin (warfarin) exactly as prescribed by your healthcare provider about the same time each day.  DO NOT stop taking without talking to the doctor who prescribed the medication.  Stopping without other blood clot prevention medication to take the place of Coumadin may increase your risk of developing a new clot or stroke.  Get refills before you run out. ° °What do you do if you miss a dose? °If you miss a dose, take it as soon as you remember on the same day then continue your regularly scheduled regimen the  next day.  Do not take two doses of Coumadin at the same time. ° °Important Safety Information °A possible side effect of Coumadin (Warfarin) is an increased risk of bleeding. You should call your healthcare provider right away if you experience any of the following: °  Bleeding from an injury or your nose that does not stop. °  Unusual colored urine (red or dark brown) or unusual colored stools (red or black). °  Unusual bruising for unknown reasons. °  A serious fall or if you hit your head (even if there is no bleeding). ° °Some foods or medicines interact with Coumadin® (warfarin) and might alter your response to warfarin. To help avoid this: °  Eat a balanced diet, maintaining a consistent amount of Vitamin K. °  Notify your provider about major diet changes you plan to make. °  Avoid alcohol or limit your intake to 1 drink for women and 2 drinks for men per day. °(1 drink is 5 oz. wine, 12 oz. beer, or 1.5 oz. liquor.) ° °Make sure that ANY health care provider who prescribes medication for you knows that you are taking Coumadin (warfarin).  Also make sure the healthcare provider who is monitoring your Coumadin knows when you have started a new medication including herbals and non-prescription products. ° °Coumadin® (Warfarin)  Major Drug Interactions  °Increased Warfarin Effect Decreased Warfarin Effect  °Alcohol (large quantities) °Antibiotics (esp. Septra/Bactrim, Flagyl, Cipro) °Amiodarone (Cordarone) °Aspirin (  ASA) °Cimetidine (Tagamet) °Megestrol (Megace) °NSAIDs (ibuprofen, naproxen, etc.) °Piroxicam (Feldene) °Propafenone (Rythmol SR) °Propranolol (Inderal) °Isoniazid (INH) °Posaconazole (Noxafil) Barbiturates (Phenobarbital) °Carbamazepine (Tegretol) °Chlordiazepoxide (Librium) °Cholestyramine (Questran) °Griseofulvin °Oral Contraceptives °Rifampin °Sucralfate (Carafate) °Vitamin K  ° °Coumadin® (Warfarin) Major Herbal Interactions  °Increased Warfarin Effect Decreased Warfarin Effect   °Garlic °Ginseng °Ginkgo biloba Coenzyme Q10 °Green tea °St. John’s wort   ° °Coumadin® (Warfarin) FOOD Interactions  °Eat a consistent number of servings per week of foods HIGH in Vitamin K °(1 serving = ½ cup)  °Collards (cooked, or boiled & drained) °Kale (cooked, or boiled & drained) °Mustard greens (cooked, or boiled & drained) °Parsley *serving size only = ¼ cup °Spinach (cooked, or boiled & drained) °Swiss chard (cooked, or boiled & drained) °Turnip greens (cooked, or boiled & drained)  °Eat a consistent number of servings per week of foods MEDIUM-HIGH in Vitamin K °(1 serving = 1 cup)  °Asparagus (cooked, or boiled & drained) °Broccoli (cooked, boiled & drained, or raw & chopped) °Brussel sprouts (cooked, or boiled & drained) *serving size only = ½ cup °Lettuce, raw (green leaf, endive, romaine) °Spinach, raw °Turnip greens, raw & chopped  ° °These websites have more information on Coumadin (warfarin):  www.coumadin.com; °www.ahrq.gov/consumer/coumadin.htm; ° ° ° °

## 2015-07-03 LAB — CBC
HEMATOCRIT: 48.3 % (ref 39.0–52.0)
Hemoglobin: 16.4 g/dL (ref 13.0–17.0)
MCH: 32.1 pg (ref 26.0–34.0)
MCHC: 34 g/dL (ref 30.0–36.0)
MCV: 94.5 fL (ref 78.0–100.0)
PLATELETS: 218 10*3/uL (ref 150–400)
RBC: 5.11 MIL/uL (ref 4.22–5.81)
RDW: 14 % (ref 11.5–15.5)
WBC: 12.7 10*3/uL — AB (ref 4.0–10.5)

## 2015-07-03 LAB — DIGOXIN LEVEL: DIGOXIN LVL: 0.8 ng/mL (ref 0.8–2.0)

## 2015-07-03 LAB — BASIC METABOLIC PANEL
Anion gap: 7 (ref 5–15)
BUN: 10 mg/dL (ref 6–20)
CALCIUM: 8.8 mg/dL — AB (ref 8.9–10.3)
CHLORIDE: 103 mmol/L (ref 101–111)
CO2: 24 mmol/L (ref 22–32)
CREATININE: 0.97 mg/dL (ref 0.61–1.24)
GFR calc non Af Amer: >60 mL/min (ref >60)
Glucose, Bld: 113 mg/dL — ABNORMAL HIGH (ref 65–99)
Potassium: 4.3 mmol/L (ref 3.5–5.1)
Sodium: 134 mmol/L — ABNORMAL LOW (ref 135–145)

## 2015-07-03 LAB — HEPARIN LEVEL (UNFRACTIONATED): Heparin Unfractionated: 0.54 IU/mL (ref 0.30–0.70)

## 2015-07-03 LAB — CARBOXYHEMOGLOBIN
CARBOXYHEMOGLOBIN: 1.4 % (ref 0.5–1.5)
Methemoglobin: 0.6 % (ref 0.0–1.5)
O2 SAT: 72.5 %
TOTAL HEMOGLOBIN: 16.6 g/dL (ref 13.5–18.0)

## 2015-07-03 LAB — PROTIME-INR
INR: 1.26 (ref 0.00–1.49)
Prothrombin Time: 15.9 seconds — ABNORMAL HIGH (ref 11.6–15.2)

## 2015-07-03 MED ORDER — SPIRONOLACTONE 25 MG PO TABS: 25.0000 mg | ORAL_TABLET | Freq: Every day | ORAL | Status: DC

## 2015-07-03 MED ORDER — DIGOXIN 250 MCG PO TABS: 0.2500 mg | ORAL_TABLET | Freq: Every day | ORAL | Status: DC

## 2015-07-03 MED ORDER — FUROSEMIDE 20 MG PO TABS: 20.0000 mg | ORAL_TABLET | Freq: Every day | ORAL | Status: DC

## 2015-07-03 MED ORDER — FUROSEMIDE 40 MG PO TABS: 40.0000 mg | ORAL_TABLET | Freq: Every day | ORAL | Status: DC

## 2015-07-03 MED ORDER — AMIODARONE HCL 200 MG PO TABS: 200.0000 mg | ORAL_TABLET | Freq: Two times a day (BID) | ORAL | Status: DC

## 2015-07-03 MED ORDER — AMIODARONE HCL 200 MG PO TABS
200.0000 mg | ORAL_TABLET | Freq: Two times a day (BID) | ORAL | Status: DC
  Administered 2015-07-03: 200 mg via ORAL
  Filled 2015-07-03: qty 1

## 2015-07-03 MED ORDER — WARFARIN SODIUM 5 MG PO TABS: 5.0000 mg | ORAL_TABLET | Freq: Every day | ORAL | Status: DC

## 2015-07-03 MED ORDER — LOSARTAN POTASSIUM 25 MG PO TABS: 25.0000 mg | ORAL_TABLET | Freq: Two times a day (BID) | ORAL | Status: DC

## 2015-07-03 MED ORDER — WARFARIN SODIUM 7.5 MG PO TABS
7.5000 mg | ORAL_TABLET | Freq: Once | ORAL | Status: AC
  Administered 2015-07-03: 7.5 mg via ORAL
  Filled 2015-07-03: qty 1

## 2015-07-03 NOTE — Progress Notes (Signed)
Advanced Heart Failure Rounding Note   Subjective:    Cath on 10/10 with minimal non-obstructive CAD. Low output on RHC with co-ox 46%.  Started on milrinone and IV lasix. Milrinoen stopped 10/12.   Yesterday started on amio drip and heparin for A fib RVR. Remains in Sinus Tach.    Overall feeling ok. Refuses statin and eliquis. Started on coumadin. Denies SOB/Orthopnea.    Objective:   Weight Range:  Vital Signs:   Temp:  [97.5 F (36.4 C)-98.8 F (37.1 C)] 98.4 F (36.9 C) (10/11 0740) Pulse Rate:  [88-113] 95 (10/10 2250) Resp:  [0-39] 24 (10/11 0740) BP: (68-139)/(43-102) 104/74 mmHg (10/11 0740) SpO2:  [93 %-100 %] 97 % (10/11 0740) Weight:  [92.171 kg (203 lb 3.2 oz)] 92.171 kg (203 lb 3.2 oz) (10/11 0341) Last BM Date: 07/02/15  Weight change: Filed Weights   07/01/15 0429 07/02/15 0500 07/03/15 0335  Weight: 201 lb 11.2 oz (91.491 kg) 201 lb 1 oz (91.2 kg) 202 lb (91.627 kg)    Intake/Output:   Intake/Output Summary (Last 24 hours) at 07/03/15 0850 Last data filed at 07/03/15 0700  Gross per 24 hour  Intake  888.1 ml  Output   1125 ml  Net -236.9 ml      Physical Exam: CVP 4-5  General: In bed. No resp difficulty HEENT: normal Neck: supple. JVP flat . Carotids 2+ bilat; no bruits. No lymphadenopathy or thryomegaly appreciated. Cor: PMI laterally displaced. Regular rate & rhythm. No rubs, gallops or murmurs. Lungs: clear Abdomen: Obese soft, nontender, nondistended. No hepatosplenomegaly. No bruits or masses. Good bowel sounds. Extremities: no cyanosis, clubbing, rash, edema Neuro: alert & orientedx3, cranial nerves grossly intact. moves all 4 extremities w/o difficulty. Affect pleasant  Telemetry: Sinus tach 100-110  Labs: Basic Metabolic Panel:  Recent Labs Lab 06/27/15 0310  06/30/15 0400 07/01/15 0330 07/01/15 2210 07/02/15 0500 07/03/15 0520  NA 139  < > 138 137 136 134* 134*  K 4.3  < > 4.1 4.5 4.5 4.2 4.3  CL 107  < > 105  104 105 103 103  CO2 22  < > 25 21* GLUCOSE 116*  < > 132* 112* 98 185* 113*  BUN 16  < > CREATININE 1.11  < > 1.22 0.94 1.10 0.98 0.97  CALCIUM 8.7*  < > 8.8* 9.0 8.8* 8.6* 8.8*  MG 2.1  --   --   --  2.3  --   --   < > = values in this interval not displayed.  Liver Function Tests: No results for input(s): AST, ALT, ALKPHOS, BILITOT, PROT, ALBUMIN in the last 168 hours. No results for input(s): LIPASE, AMYLASE in the last 168 hours. No results for input(s): AMMONIA in the last 168 hours.  CBC:  Recent Labs Lab 06/28/15 0402 06/29/15 0344 06/30/15 0400 07/02/15 0500 07/03/15 0520  WBC 9.4 10.1 9.8 11.3* 12.7*  HGB 15.4 16.3 15.7 15.8 16.4  HCT 46.0 48.5 46.6 46.0 48.3  MCV 93.9 94.4 94.0 93.3 94.5  PLT 229 235 230 224 218    Cardiac Enzymes: No results for input(s): CKTOTAL, CKMB, CKMBINDEX, TROPONINI in the last 168 hours.  BNP: BNP (last 3 results)  Recent Labs  06/26/15 0238  BNP 678.7*    ProBNP (last 3 results) No results for input(s): PROBNP in the last 8760 hours.    Other results:  Imaging: No results found.   Medications:  Scheduled Medications: . aspirin EC  81 mg Oral Daily  . digoxin  0.25 mg Oral Daily  . fluticasone  1 spray Each Nare Daily  . furosemide  20 mg Oral Daily  . losartan  25 mg Oral BID  . spironolactone  25 mg Oral Daily  . Warfarin - Pharmacist Dosing Inpatient   Does not apply q1800    Infusions: . amiodarone 30 mg/hr (07/03/15 0300)  . heparin 1,200 Units/hr (07/02/15 1543)    PRN Medications: sodium chloride, gi cocktail, morphine injection, ondansetron (ZOFRAN) IV, zolpidem   Assessment:   1. Acute systolic HF. LVEF 15-20% -> cardiogenic shock    --milrinone started 10/10 2. NSVT 3. Morbid obesity 4. H/o sinus tachycardia  5. 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 A fib RVR 10/13- CHADS VASc Score 2  Plan/Discussion:     In Sinus Tach today. Stop amio drip and  transition back to amio 200 mg twice a day. Refuses NOAC so he was started on coumadin. INR today 1.26. Pharmacy dosing. [facility].   CO-OX stable 73%. Appears euvolemic. CVP 8 today. Continue spiro 25 mg daily and start  20 mg lasix daily tomorrow. No bb for now. Continue dig 0.25 mg daily. Dig level 0.8.  Continue losartan 25 mg twice a day. Renal function stable.    Refuses statin  06/30/15 cMRI- NICM. EF 19%. Mod MR. RV normal. No infiltrative process  Possible d/c tomorrow.  Length of Stay: 4  Amy Clegg NP-C  07/03/2015, 8:50 AM  Advanced Heart Failure Team Pager [phone] (M-F; 7a - 4p)  Please contact [facility] for night-coverage after hours (4p -7a ) and weekends on [email or web address]  Patient seen and examined with Tonye Becket, NP. We discussed all aspects of the encounter. I agree with the assessment and plan as stated above.   He is much improved. Remains in NSR. Volume status looks good. Ok for d/c today. Agree with meds as above. We will see him next week in HF Clinic. Long talk about activity and dietary restrictions.   Bensimhon, Daniel,MD 11:51 AM

## 2015-07-03 NOTE — Progress Notes (Signed)
Discharged home by wheelchair, stable, discharge instructions given, belongings with pt.

## 2015-07-03 NOTE — Progress Notes (Signed)
ANTICOAGULATION CONSULT NOTE   Pharmacy Consult for heparin>>warfarin Indication: atrial fibrillation  Allergies  Allergen Reactions  . Pseudoephedrine     REACTION: Elevated HR    Patient Measurements: Height: 5\' 5"  (165.1 cm) Weight: 202 lb (91.627 kg) IBW/kg (Calculated) : 61.5 Heparin Dosing Weight: 82kg  Vital Signs: Temp: 98.9 F (37.2 C) (10/14 0749) Temp Source: Oral (10/14 0749) BP: 103/68 mmHg (10/14 0735) Pulse Rate: 92 (10/14 0912)  Labs:  Recent Labs  07/01/15 2210 07/02/15 0500 07/02/15 0630 07/02/15 1224 07/03/15 0520  HGB  --  15.8  --   --  16.4  HCT  --  46.0  --   --  48.3  PLT  --  224  --   --  218  LABPROT  --   --   --  16.1* 15.9*  INR  --   --   --  1.28 1.26  HEPARINUNFRC  --   --  0.45 0.37 0.54  CREATININE 1.10 0.98  --   --  0.97    Estimated Creatinine Clearance: 89.5 mL/min (by C-G formula based on Cr of 0.97).   Medical History: Past Medical History  Diagnosis Date  . Diverticulitis     ACTIVE WITH ABCESS    Scheduled:  . amiodarone  200 mg Oral BID  . aspirin EC  81 mg Oral Daily  . digoxin  0.25 mg Oral Daily  . fluticasone  1 spray Each Nare Daily  . [START ON 07/04/2015] furosemide  20 mg Oral Daily  . losartan  25 mg Oral BID  . spironolactone  25 mg Oral Daily  . Warfarin - Pharmacist Dosing Inpatient   Does not apply q1800   Infusions:  . heparin 1,200 Units/hr (07/02/15 1543)    Assessment: 54yo male admitted 10/6 w/ SOB, had cath 10/10, cont'd admission for HF, episode of Afib w/ RVR overnight and started on amio and heparin.  Baseline INR was 1.28 on 10/9. INR today remains SUBtherapeutic at 1.26 after initiation yesterday. Watch INR on concomitant amiodarone. HL remains therapeutic this am.   This patients CHA2DS2-VASc Score and unadjusted Ischemic Stroke Rate (% per year) is equal to 2.2 % stroke rate/year from a score of 2  Above score calculated as 1 point each if present [CHF, HTN, DM,  Vascular=MI/PAD/Aortic Plaque, Age if 65-74, or Male] Above score calculated as 2 points each if present [Age > 75, or Stroke/TIA/TE]   Goal of Therapy: . INR goal 2-3 Heparin level 0.3-0.7 units/ml Monitor platelets by anticoagulation protocol: Yes   Plan:  D/c home today without bridging per HF team Warfarin 7.5 mg x 1 before d/c and then 5 mg daily with INR check early next week   Eriberto Felch K. Bonnye Fava, PharmD, BCPS, CPP Clinical Pharmacist Pager: [phone] Phone: [phone] 07/03/2015 9:31 AM

## 2015-07-03 NOTE — Progress Notes (Signed)
After PICC removal instructed to remain on bedrest for 30 minutes.   Also to keep drsg D/I for 24 hours.  Instructed to call RN if bleeding,swelling or tightness at sight occurs.   Verbalized understanding.

## 2015-07-03 NOTE — Discharge Summary (Signed)
Advanced Heart Failure Team  Discharge Summary   Patient ID: [name] MRN: [id number], DOB/AGE: 1961/03/01 54 y.o. Admit date: 06/25/2015 D/C date:     07/03/2015   Primary Discharge Diagnoses:  1. Acute systolic HF. LVEF 15-20% -> cardiogenic shock  --CMRI- NICM EF 19% mod MR. RV normal. No infiltrative process 2. NSVT 3. Morbid obesity 4. H/o sinus tachycardia  5. 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 A fib RVR 10/13---Chemically converted to Sinus Tach. Amio 200 mg twice daily.  CHADS VASc Score 2  Hospital Course:   Mr [name] is a 54 year old with no known cardiac history  admitted with progressive dyspnea on exertion. An ECHO was performed and showed severly depressed EF. He was diuresed with IV lasix and once optimized he was taken to the cath lab for RHC/ LHC. This showed minimal obstructive CAD, severe NICM, elevated filling pressures, and cardiogenic shock.. At that point he was started on milrinone but later weaned off as he improved with adequate mixed venous saturation.  cMRI performed and showed-NICM,  EF 19%, mod MR, normal RV, and no infiltrative process.  PICC line was removed on the day of discharge. Overall he diuresed 13 pounds. He will not be placed on bb for now due to cardiogenic shock.  He will continue spiro, low dose lasix and losartan.   While hospitalized he developed A fib RVR and started on amiodarone drip. He later chemically converted to Sinus Tach.  Once loaded on amiodarone he transitioned to amiodarone 200 mg twice daily. We considered NOAC however he refused and requested coumadin. He will continue coumadin and this will be followed at [facility].   He will continue to be followed closely in the HF clinic. He was referred to the coumadin clinic for INR management.   ECHO 06/26/2015 LV severely dilated (LVIDd 7.2cm) EF 15-20% RV mild to moderately reduced   cMRI 06/30/15 cMRI- NICM. EF 19%. Mod MR. RV normal.  No infiltrative process  RHC/LHC 06/29/2015 RA = 10  RV = 39/11/15  PA = 42/19 (33)  PCW = 27  Fick cardiac output/index = 2.4/1.2  PVR = 2.5 WU  SVR = 2560  FA sat = 97%  PA sat = 45%, 47%  Ao = 108/74 (88)  LV = 101/16/25  Abdominal aortogram  --Renal arteries widely patent  --Mild aortic & iliac plaquing  Assessment:  1. Mild non-obstructive CAD with small coronaries  2. Severe NICM. EF 10%  3. Elevated filling pressures with severely depressed cardiac output   Discharge Weight Range: 202 pounds  Discharge Vitals: Blood pressure 103/68, pulse 92, temperature 98.9 F (37.2 C), temperature source Oral, resp. rate 22, height  (1.651 m), weight 202 lb (91.627 kg), SpO2 92 %.  Labs: Lab Results  Component Value Date   WBC 12.7* 07/03/2015   HGB 16.4 07/03/2015   HCT 48.3 07/03/2015   MCV 94.5 07/03/2015   PLT 218 07/03/2015    Recent Labs Lab 07/03/15 0520  NA 134*  K 4.3  CL 103  CO2 24  BUN 10  CREATININE 0.97  CALCIUM 8.8*  GLUCOSE 113*   Lab Results  Component Value Date   CHOL 169 06/26/2015   HDL 20* 06/26/2015   LDLCALC 112* 06/26/2015   TRIG 183* 06/26/2015   BNP (last 3 results)  Recent Labs  06/26/15 0238  BNP 678.7*    ProBNP (last 3 results) No results for input(s): PROBNP in the  last 8760 hours.   Diagnostic Studies/Procedures   No results found.  Discharge Medications     Medication List    TAKE these medications        amiodarone 200 MG tablet  Commonly known as:  PACERONE  Take 1 tablet (200 mg total) by mouth 2 (two) times daily.     digoxin 0.25 MG tablet  Commonly known as:  LANOXIN  Take 1 tablet (0.25 mg total) by mouth daily.     fluticasone 50 MCG/ACT nasal spray  Commonly known as:  FLONASE  Place 1 spray into both nostrils daily.     furosemide 40 MG tablet  Commonly known as:  LASIX  Take 1 tablet (40 mg total) by mouth daily.  Start taking on:  07/04/2015     HYDROcodone-homatropine 5-1.5  MG/5ML syrup  Commonly known as:  HYCODAN  Take 5 mLs by mouth every 6 (six) hours as needed for cough.     ipratropium 0.06 % nasal spray  Commonly known as:  ATROVENT  Place 2 sprays into the nose 3 (three) times daily.     losartan 25 MG tablet  Commonly known as:  COZAAR  Take 1 tablet (25 mg total) by mouth 2 (two) times daily.     MULTIVITAL PO  Take by mouth daily. MENS MEGA MULTIVIT.     spironolactone 25 MG tablet  Commonly known as:  ALDACTONE  Take 1 tablet (25 mg total) by mouth daily.     vitamin B-12 500 MCG tablet  Commonly known as:  CYANOCOBALAMIN  Take 500 mcg by mouth daily.     warfarin 5 MG tablet  Commonly known as:  COUMADIN  Take 1 tablet (5 mg total) by mouth daily.  Start taking on:  07/04/2015        Disposition   The patient will be discharged in stable condition to home.     Discharge Instructions    ACE Inhibitor / ARB already ordered    Complete by:  As directed      Amb Referral to Cardiac Rehabilitation    Complete by:  As directed   Congestive Heart Failure: If diagnosis is Heart Failure, patient MUST meet each of the CMS criteria: 1. Left Ventricular Ejection Fraction </= 35% 2. NYHA class II-IV symptoms despite being on optimal heart failure therapy for at least 6 weeks. 3. Stable = have not had a recent (<6 weeks) or planned (<6 months) major cardiovascular hospitalization or procedure  Program Details: - Physician supervised classes - 1-3 classes per week over a 12-18 week period, generally for a total of 36 sessions  Physician Certification: I certify that the above Cardiac Rehabilitation treatment is medically necessary and is medically approved by me for treatment of this patient. The patient is willing and cooperative, able to ambulate and medically stable to participate in exercise rehabilitation. The participant's progress and Individualized Treatment Plan will be reviewed by the Medical Director, Cardiac Rehab staff and  as indicated by the Referring/Ordering Physician.  Diagnosis:  Heart Failure (see criteria below)     Diet - low sodium heart healthy    Complete by:  As directed      Heart Failure patients record your daily weight using the same scale at the same time of day    Complete by:  As directed      Increase activity slowly    Complete by:  As directed  Follow-up Information    Follow up with Arvilla Meres, MD On 07/14/2015.   Specialty:  Cardiology   Why:  at 300 pm for post hospital followup.  Please bring all of your medications to your visit. Code for patient parking is 0900.   Contact information:   [address] [place] [address] [phone]       Follow up with [facility] On 07/09/2015.   Specialty:  Cardiology   Why:  at 9:30 Coumadin Clinic    Contact information:   [address], [address] [place] [address] [phone]        Duration of Discharge Encounter: Greater than 35 minutes   Waneta Martins NP-C  07/03/2015, 10:43 AM  Patient seen and examined with Tonye Becket, NP. We discussed all aspects of the encounter. I agree with the assessment and plan as stated above.   He is much improved. Ok for d/c today. Agree with meds as above. We will see him next week in HF Clinic.  Long talk about activity and dietary restrictions.   Sundai Probert,MD 11:50 AM

## 2015-07-06 ENCOUNTER — Telehealth (HOSPITAL_COMMUNITY): Payer: Self-pay | Admitting: Surgery

## 2015-07-06 NOTE — Telephone Encounter (Signed)
Heart Failure Nurse Navigator Post Discharge Telephone Call  I received all back from Mr. Steer.  He says that he has been feeling "great" since discharge from the hospital.  He has "given up salt" and is being very careful with sodium.  He has been weighing each day and tells me that his weight today is 195 lbs versus discharge weight of 202lbs on 07/03/15.  He had questions related to his BP.  He says that at times it runs "low".  He says he "thinks it was 77 over something" yesterday -yet has not checked it again today.  I asked him to check it today and if it remains lower than 90 systolic to call the AHF clinic--he agrees to do that.   I reminded him of his follow-up appt on October 25th in the AHF Clinic.  I have encouraged him to call me back with any questions or concerns related to his HF.

## 2015-07-09 ENCOUNTER — Ambulatory Visit (INDEPENDENT_AMBULATORY_CARE_PROVIDER_SITE_OTHER): Payer: BLUE CROSS/BLUE SHIELD

## 2015-07-09 DIAGNOSIS — Z7901 Long term (current) use of anticoagulants: Secondary | ICD-10-CM

## 2015-07-09 DIAGNOSIS — I4891 Unspecified atrial fibrillation: Secondary | ICD-10-CM

## 2015-07-09 LAB — POCT INR: INR: 3.1

## 2015-07-09 NOTE — Patient Instructions (Signed)

## 2015-07-14 ENCOUNTER — Ambulatory Visit (HOSPITAL_COMMUNITY)
Admit: 2015-07-14 | Discharge: 2015-07-14 | Disposition: A | Discharge status: Home / Self Care | Payer: BLUE CROSS/BLUE SHIELD | Source: Ambulatory Visit | Attending: Internal Medicine | Admitting: Internal Medicine

## 2015-07-14 VITALS — BP 142/72 | HR 92 | Wt 202.2 lb

## 2015-07-14 DIAGNOSIS — F17200 Nicotine dependence, unspecified, uncomplicated: Secondary | ICD-10-CM | POA: Diagnosis not present

## 2015-07-14 DIAGNOSIS — I4891 Unspecified atrial fibrillation: Secondary | ICD-10-CM | POA: Diagnosis not present

## 2015-07-14 DIAGNOSIS — I5021 Acute systolic (congestive) heart failure: Secondary | ICD-10-CM | POA: Diagnosis not present

## 2015-07-14 DIAGNOSIS — Z79899 Other long term (current) drug therapy: Secondary | ICD-10-CM | POA: Insufficient documentation

## 2015-07-14 DIAGNOSIS — I5022 Chronic systolic (congestive) heart failure: Secondary | ICD-10-CM | POA: Diagnosis not present

## 2015-07-14 DIAGNOSIS — I472 Ventricular tachycardia: Secondary | ICD-10-CM | POA: Insufficient documentation

## 2015-07-14 DIAGNOSIS — Z7901 Long term (current) use of anticoagulants: Secondary | ICD-10-CM | POA: Diagnosis not present

## 2015-07-14 DIAGNOSIS — I428 Other cardiomyopathies: Secondary | ICD-10-CM | POA: Diagnosis not present

## 2015-07-14 DIAGNOSIS — I2510 Atherosclerotic heart disease of native coronary artery without angina pectoris: Secondary | ICD-10-CM | POA: Diagnosis not present

## 2015-07-14 DIAGNOSIS — Z8249 Family history of ischemic heart disease and other diseases of the circulatory system: Secondary | ICD-10-CM | POA: Insufficient documentation

## 2015-07-14 LAB — BASIC METABOLIC PANEL
Anion gap: 12 (ref 5–15)
BUN: 21 mg/dL — AB (ref 6–20)
CHLORIDE: 99 mmol/L — AB (ref 101–111)
CO2: 25 mmol/L (ref 22–32)
CREATININE: 1.98 mg/dL — AB (ref 0.61–1.24)
Calcium: 9.9 mg/dL (ref 8.9–10.3)
GFR calc Af Amer: 42 mL/min — ABNORMAL LOW (ref >60)
GFR calc non Af Amer: 36 mL/min — ABNORMAL LOW (ref >60)
GLUCOSE: 87 mg/dL (ref 65–99)
POTASSIUM: 5.3 mmol/L — AB (ref 3.5–5.1)
Sodium: 136 mmol/L (ref 135–145)

## 2015-07-14 MED ORDER — CARVEDILOL 3.125 MG PO TABS: 3.1250 mg | ORAL_TABLET | Freq: Two times a day (BID) | ORAL | Status: DC

## 2015-07-14 NOTE — Progress Notes (Signed)
Advanced HF CLINIC NOTE  Patient ID: [name], male   DOB: 1961-04-30, 54 y.o.   MRN: [id number] PCP: Primary HF Cardiologist: Dr Gala Romney  HPI: [name] is a 54 year old with no known cardiac history admitted 06/25/15 with progressive dyspnea on exertion. An ECHO was performed and showed severly depressed EF. He was diuresed with IV lasix and once optimized he was taken to the cath lab for RHC/ LHC. This showed minimal obstructive CAD, severe NICM, elevated filling pressures, and cardiogenic shock. At that point he was started on milrinone but later weaned off as he improved with adequate mixed venous saturation. cMRI performed and showed-NICM, EF 19%, mod [name], normal RV, and no infiltrative process. Also had A fib RVR but chemically converted with amio 200 mg twice daily. Started on coumadin. (Refused DOAC)  Today he returns for post hospital follow up. Says he feels great. Denies SOB/PND/Orthopnea. Getting more active. Weight at home 195 pounds. SBP at home 90-100s. Not smoking. Compliant with all meds.Refuses statin.    ROS: All systems negative except as listed in HPI, PMH and Problem List.  SH:  Social History   Social History  . Marital Status: Divorced    Spouse Name: N/A  . Number of Children: N/A  . Years of Education: N/A   Occupational History  . Not on file.   Social History Main Topics  . Smoking status: Current Every Day Smoker  . Smokeless tobacco: Not on file  . Alcohol Use: Yes     Comment: weekly  . Drug Use: Not on file  . Sexual Activity: Not on file   Other Topics Concern  . Not on file   Social History Narrative    FH:  Family History  Problem Relation Age of Onset  . Hypertension Mother     Past Medical History  Diagnosis Date  . Diverticulitis     ACTIVE WITH ABCESS    Current Outpatient Prescriptions  Medication Sig Dispense Refill  . amiodarone (PACERONE) 200 MG tablet Take 1 tablet (200 mg total) by mouth 2 (two)  times daily. 60 tablet 6  . digoxin (LANOXIN) 0.25 MG tablet Take 1 tablet (0.25 mg total) by mouth daily. 30 tablet 6  . fluticasone (FLONASE) 50 MCG/ACT nasal spray Place 1 spray into both nostrils daily.    . furosemide (LASIX) 40 MG tablet Take 1 tablet (40 mg total) by mouth daily. 30 tablet 6  . losartan (COZAAR) 25 MG tablet Take 1 tablet (25 mg total) by mouth 2 (two) times daily. 60 tablet 6  . Multiple Vitamins-Minerals (MULTIVITAL PO) Take by mouth daily. MENS MEGA MULTIVIT.     Marland Kitchen spironolactone (ALDACTONE) 25 MG tablet Take 1 tablet (25 mg total) by mouth daily. 30 tablet 6  . vitamin B-12 (CYANOCOBALAMIN) 500 MCG tablet Take 500 mcg by mouth daily.    Marland Kitchen warfarin (COUMADIN) 5 MG tablet Take 1 tablet (5 mg total) by mouth daily. 45 tablet 6   No current facility-administered medications for this encounter.    Filed Vitals:   07/14/15 1504  BP: 142/72  Pulse: 92  Weight: 202 lb 4 oz (91.74 kg)  SpO2: 95%    PHYSICAL EXAM:  General:  Well appearing. No resp difficulty HEENT: normal Neck: supple. JVP 5-6. Carotids 2+ bilaterally; no bruits. No lymphadenopathy or thryomegaly appreciated. Cor: PMI normal. Regular rate & rhythm. No rubs, gallops or murmurs. Lungs: clear Abdomen: obese soft, nontender, nondistended. No hepatosplenomegaly. No bruits  or masses. Good bowel sounds. Extremities: no cyanosis, clubbing, rash, edema Neuro: alert & orientedx3, cranial nerves grossly intact. Moves all 4 extremities w/o difficulty. Affect pleasant.   ECG:NSR 87 bpm    ASSESSMENT & PLAN: 1. Chronic Systolic HF. LVEF 15-20% -> cardiogenic shock --CMRI- NICM EF 19% mod [name]. RV normal. No infiltrative process.  --NYHA II. Volume status looks good.  --Continue dig. Start carvedilol 3.125 mg twice a day.  --Continue losartan 25 mg twice daily --Continue lasix 40 mg daiy Repeat ECHO after HF meds optimized.  2. NSVT- on amio and starting carvedilol . Next visit cut back amio 200 mg daily   3. Morbid obesity 4. H/O sinus tachycardia  5. 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 A fib RVR 07/02/2015 ---Chemically converted to Sinus Tach on  Amio 200 mg twice daily. Today he is maintaining NSR. CHADS VASc Score 2. On coumadin   Tonye Becket, NP-C  Patient seen and examined with Tonye Becket, NP. We discussed all aspects of the encounter. I agree with the assessment and plan as stated above.   Much improved. Volume status looks good. Agree with med titration as above.   Lawsyn Heiler,MD 9:42 PM

## 2015-07-14 NOTE — Patient Instructions (Signed)
Start Carvedilol 3.125 mg Twice daily   Labs today  Your physician recommends that you schedule a follow-up appointment in: 2 weeks  

## 2015-07-15 ENCOUNTER — Encounter (HOSPITAL_COMMUNITY): Payer: Self-pay | Admitting: Internal Medicine

## 2015-07-15 MED ORDER — CARVEDILOL 3.125 MG PO TABS: 3.1250 mg | ORAL_TABLET | Freq: Two times a day (BID) | ORAL | Status: DC

## 2015-07-16 ENCOUNTER — Ambulatory Visit (INDEPENDENT_AMBULATORY_CARE_PROVIDER_SITE_OTHER): Payer: BLUE CROSS/BLUE SHIELD | Admitting: *Deleted

## 2015-07-16 ENCOUNTER — Ambulatory Visit: Payer: BLUE CROSS/BLUE SHIELD | Admitting: Internal Medicine

## 2015-07-16 DIAGNOSIS — I4891 Unspecified atrial fibrillation: Secondary | ICD-10-CM

## 2015-07-16 DIAGNOSIS — Z7901 Long term (current) use of anticoagulants: Secondary | ICD-10-CM

## 2015-07-16 LAB — POCT INR: INR: 3.6

## 2015-07-18 ENCOUNTER — Telehealth: Payer: Self-pay | Admitting: Internal Medicine

## 2015-07-18 NOTE — Telephone Encounter (Signed)
[facility]  Returned a page to the patient regarding mild chest pressure in the setting of hypotension (BP 90 - 99 / 44 - 54) after starting Carvedilol the day prior.  He denied lightheadedness.  Over the last year, his OP blood pressures are noted to be 103 - 142 / 68 - 80.  Therefore, advised that he hold the Carvedilol for now & that Dr. Gala Romney may consider other options such as Metoprolol XL as an alternative.  He said that he would call the office Monday, & I said that I would leave a message for Dr. Gala Romney.    [name]

## 2015-07-20 ENCOUNTER — Telehealth (HOSPITAL_COMMUNITY): Payer: Self-pay

## 2015-07-20 NOTE — Telephone Encounter (Signed)
Patient called regarding phone call on Friday and wants to know if Dr. Gala Romney got the message  Copied text:   Expand All Collapse All   [facility]  Returned a page to the patient regarding mild chest pressure in the setting of hypotension (BP 90 - 99 / 44 - 54) after starting Carvedilol the day prior. He denied lightheadedness. Over the last year, his OP blood pressures are noted to be 103 - 142 / 68 - 80. Therefore, advised that he hold the Carvedilol for now & that Dr. Gala Romney may consider other options such as Metoprolol XL as an alternative. He said that he would call the office Monday, & I said that I would leave a message for Dr. Gala Romney.   [name]

## 2015-07-22 NOTE — Telephone Encounter (Signed)
Take carvedilol 3.125 only at bedtime

## 2015-07-23 ENCOUNTER — Ambulatory Visit (INDEPENDENT_AMBULATORY_CARE_PROVIDER_SITE_OTHER): Payer: BLUE CROSS/BLUE SHIELD | Admitting: *Deleted

## 2015-07-23 ENCOUNTER — Telehealth (HOSPITAL_COMMUNITY): Payer: Self-pay | Admitting: *Deleted

## 2015-07-23 ENCOUNTER — Encounter (HOSPITAL_COMMUNITY): Payer: Self-pay | Admitting: *Deleted

## 2015-07-23 ENCOUNTER — Telehealth (HOSPITAL_COMMUNITY): Payer: Self-pay

## 2015-07-23 DIAGNOSIS — Z7901 Long term (current) use of anticoagulants: Secondary | ICD-10-CM | POA: Diagnosis not present

## 2015-07-23 DIAGNOSIS — I4891 Unspecified atrial fibrillation: Secondary | ICD-10-CM | POA: Diagnosis not present

## 2015-07-23 LAB — POCT INR: INR: 2.6

## 2015-07-23 NOTE — Telephone Encounter (Signed)
Needs his return to work certification (medical leave) form completed with next OV (07-28-15).

## 2015-07-23 NOTE — Telephone Encounter (Signed)
Faxed STD papers to [facility] [phone]  Mercer Pod D RN

## 2015-07-23 NOTE — Telephone Encounter (Signed)
Called patient and told him to take the Carvedilol 3.125 mg only at bedtime Repeated instructions back

## 2015-07-24 ENCOUNTER — Telehealth (HOSPITAL_COMMUNITY): Payer: Self-pay

## 2015-07-24 NOTE — Telephone Encounter (Signed)
Patient feels that Carvedilol is keeping him up at night and said he is not going to take it anymore Said after taking it last night he was up for 2 1/2 hours not able to sleep and causing him to feel pain in his chest  Instructed patient if he has this again without taking carvedilol to seek medical care immediately.  C/o indigestion when has taken during day  Routed to Dr. Gala Romney

## 2015-07-28 ENCOUNTER — Ambulatory Visit (HOSPITAL_COMMUNITY)
Admission: RE | Admit: 2015-07-28 | Discharge: 2015-07-28 | Disposition: A | Discharge status: Home / Self Care | Payer: BLUE CROSS/BLUE SHIELD | Source: Ambulatory Visit | Attending: Internal Medicine | Admitting: Internal Medicine

## 2015-07-28 VITALS — BP 110/64 | HR 91 | Wt 206.4 lb

## 2015-07-28 DIAGNOSIS — I2510 Atherosclerotic heart disease of native coronary artery without angina pectoris: Secondary | ICD-10-CM | POA: Diagnosis not present

## 2015-07-28 DIAGNOSIS — I5022 Chronic systolic (congestive) heart failure: Secondary | ICD-10-CM | POA: Diagnosis not present

## 2015-07-28 DIAGNOSIS — Z79899 Other long term (current) drug therapy: Secondary | ICD-10-CM | POA: Insufficient documentation

## 2015-07-28 DIAGNOSIS — Z8249 Family history of ischemic heart disease and other diseases of the circulatory system: Secondary | ICD-10-CM | POA: Insufficient documentation

## 2015-07-28 DIAGNOSIS — I5021 Acute systolic (congestive) heart failure: Secondary | ICD-10-CM | POA: Diagnosis not present

## 2015-07-28 DIAGNOSIS — Z7901 Long term (current) use of anticoagulants: Secondary | ICD-10-CM | POA: Insufficient documentation

## 2015-07-28 DIAGNOSIS — I4891 Unspecified atrial fibrillation: Secondary | ICD-10-CM | POA: Diagnosis not present

## 2015-07-28 DIAGNOSIS — Z87891 Personal history of nicotine dependence: Secondary | ICD-10-CM | POA: Diagnosis not present

## 2015-07-28 DIAGNOSIS — Z720 Tobacco use: Secondary | ICD-10-CM | POA: Diagnosis not present

## 2015-07-28 DIAGNOSIS — I428 Other cardiomyopathies: Secondary | ICD-10-CM | POA: Insufficient documentation

## 2015-07-28 DIAGNOSIS — F17200 Nicotine dependence, unspecified, uncomplicated: Secondary | ICD-10-CM

## 2015-07-28 DIAGNOSIS — I472 Ventricular tachycardia: Secondary | ICD-10-CM | POA: Insufficient documentation

## 2015-07-28 DIAGNOSIS — E669 Obesity, unspecified: Secondary | ICD-10-CM

## 2015-07-28 LAB — BASIC METABOLIC PANEL
ANION GAP: 10 (ref 5–15)
BUN: 13 mg/dL (ref 6–20)
CHLORIDE: 97 mmol/L — AB (ref 101–111)
CO2: 23 mmol/L (ref 22–32)
Calcium: 9.3 mg/dL (ref 8.9–10.3)
Creatinine, Ser: 0.99 mg/dL (ref 0.61–1.24)
GFR calc Af Amer: >60 mL/min (ref >60)
GFR calc non Af Amer: >60 mL/min (ref >60)
GLUCOSE: 156 mg/dL — AB (ref 65–99)
POTASSIUM: 3.8 mmol/L (ref 3.5–5.1)
Sodium: 130 mmol/L — ABNORMAL LOW (ref 135–145)

## 2015-07-28 LAB — DIGOXIN LEVEL: Digoxin Level: 2.4 ng/mL — ABNORMAL HIGH (ref 0.8–2.0)

## 2015-07-28 MED ORDER — AMIODARONE HCL 200 MG PO TABS: 200.0000 mg | ORAL_TABLET | Freq: Every day | ORAL | Status: DC

## 2015-07-28 NOTE — Progress Notes (Signed)
Advanced HF CLINIC NOTE  Patient ID: [name], male   DOB: [birth year], 54 y.o.   MRN: [id number] PCP: Primary HF Cardiologist: Dr Gala Romney  HPI: [name] is a 54 year old with no known cardiac history admitted 06/25/15 with progressive dyspnea on exertion. An ECHO was performed and showed severly depressed EF. He was diuresed with IV lasix and once optimized he was taken to the cath lab for RHC/ LHC. This showed minimal obstructive CAD, severe NICM, elevated filling pressures, and cardiogenic shock. At that point he was started on milrinone but later weaned off as he improved with adequate mixed venous saturation. cMRI performed and showed-NICM, EF 19%, mod [name], normal RV, and no infiltrative process. Also had A fib RVR but chemically converted with amio 200 mg twice daily. Started on coumadin. (Refused DOAC)  Today he returns for post hospital follow up. Intolerant carvedilol due chest pressure. [name] stopped due to hyperkalemia. Denies SOB/PND/Orthopnea. Weight at home 195-198 pounds. Taking all medications.Following low salt diet.  Uses lots of [name] . Refuses statin. No bleeding problems.   Labs 07/14/15: K 5.3 Creatinine 1.98 [name] stopped.    ROS: All systems negative except as listed in HPI, PMH and Problem List.  SH:  Social History   Social History  . Marital Status: Divorced    Spouse Name: N/A  . Number of Children: N/A  . Years of Education: N/A   Occupational History  . Not on file.   Social History Main Topics  . Smoking status: Former Smoker    Quit date: 06/25/2015  . Smokeless tobacco: Not on file  . Alcohol Use: 0.0 oz/week    0 Standard drinks or equivalent per week     Comment: weekly  . Drug Use: Not on file  . Sexual Activity: Not on file   Other Topics Concern  . Not on file   Social History Narrative    FH:  Family History  Problem Relation Age of Onset  . Hypertension Mother     Past Medical History  Diagnosis Date  .  Diverticulitis     ACTIVE WITH ABCESS    Current Outpatient Prescriptions  Medication Sig Dispense Refill  . amiodarone (PACERONE) 200 MG tablet Take 1 tablet (200 mg total) by mouth 2 (two) times daily. 60 tablet 6  . digoxin (LANOXIN) 0.25 MG tablet Take 1 tablet (0.25 mg total) by mouth daily. 30 tablet 6  . fluticasone (FLONASE) 50 MCG/ACT nasal spray Place 1 spray into both nostrils daily.    . furosemide (LASIX) 40 MG tablet Take 1 tablet (40 mg total) by mouth daily. 30 tablet 6  . losartan (COZAAR) 25 MG tablet Take 1 tablet (25 mg total) by mouth 2 (two) times daily. 60 tablet 6  . Multiple Vitamins-Minerals (MULTIVITAL PO) Take by mouth daily. MENS MEGA MULTIVIT.     Marland Kitchen vitamin B-12 (CYANOCOBALAMIN) 500 MCG tablet Take 500 mcg by mouth daily.    Marland Kitchen warfarin (COUMADIN) 5 MG tablet Take 1 tablet (5 mg total) by mouth daily. 45 tablet 6   No current facility-administered medications for this encounter.    Filed Vitals:   07/28/15 1116  BP: 110/64  Pulse: 91  Weight: 206 lb 6.4 oz (93.622 kg)  SpO2: 96%    PHYSICAL EXAM: General:  Well appearing. No resp difficulty. Mom and Dad present.  HEENT: normal Neck: supple. JVP 5-6. Carotids 2+ bilaterally; no bruits. No lymphadenopathy or thryomegaly appreciated. Cor: PMI normal.  Regular rate & rhythm. No rubs, gallops or murmurs. Lungs: clear Abdomen: obese soft, nontender, nondistended. No hepatosplenomegaly. No bruits or masses. Good bowel sounds. Extremities: no cyanosis, clubbing, rash, edema Neuro: alert & orientedx3, cranial nerves grossly intact. Moves all 4 extremities w/o difficulty. Affect pleasant.   ASSESSMENT & PLAN: 1. Chronic Systolic HF. LVEF 15-20% -> cardiogenic shock --06/2015 CMRI- NICM EF 19% mod [name]. RV normal. No infiltrative process.  --NYHA II. Volume status looks good.  Continue lasix 40 mg daily  --Continue dig.  [name] stopped due to hyperkalemia . Today I have asked him to stop [name]. Repeat BMET  today.  Intolerant carvedilol at the lowest dose. Attempted  Carvedilol 3.125 mg twice a day and even cut back to once a day but he remained intolerant. Feels better off it.   --Continue losartan 25 mg twice daily. Next visit consider entresto.  - Not sure that corlanor an option with history of A fib RVR. Discuss with Dr Gala Romney.  Repeat ECHO after HF meds optimized.  2. NSVT- on amio. Cut back to 200 mg dialy.   3. Morbid obesity 4. H/O sinus tachycardia  5. 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 A fib RVR 07/02/2015 ---CHADS Vasc Score 2. Chemically converted to Sinus Tach on  Amio 200 mg twice daily. Regular pulse today.  Cut back amio to 200 mg daily. On coumadin  7. Former smoker - quit 06/25/2015.   Start cardiac rehab next week.   Follow up in 6 weeks with Dr Gala Romney.   [name] Filbert Schilder, NP-C 07/28/2015 1200

## 2015-07-28 NOTE — Patient Instructions (Signed)
Medications;  Decrease amiodarone 200 mg daily  Follow up:  6 weeks with Bensimhon  Stop the [name] its has Potassium in it

## 2015-07-28 NOTE — Progress Notes (Signed)
Advanced Heart Failure Medication Review by a Pharmacist  Does the patient  feel that his/her medications are working for him/her?  yes  Has the patient been experiencing any side effects to the medications prescribed?  yes  Does the patient measure his/her own blood pressure or blood glucose at home?  yes   Does the patient have any problems obtaining medications due to transportation or finances?   no  Understanding of regimen: good Understanding of indications: good Potential of compliance: good Patient understands to avoid NSAIDs. Patient understands to avoid decongestants.  Issues to address at subsequent visits: None   Pharmacist comments:  [name] is a pleasant 54 yo M presenting with his medication bottles. He reports excellent compliance with his medications but he has discontinued use of his carvedilol for chest pressure and a feeling of "impending doom" even after taking a low dose in the evening. He did not have any other specific medication-related questions or concerns for me at this time.  [name]. Bonnye Fava, PharmD, BCPS, CPP Clinical Pharmacist Pager: [phone] Phone: [phone] 07/28/2015 11:35 AM      Time with patient: 6 minutes Preparation and documentation time: 4 minutes Total time: 10 minutes

## 2015-07-29 ENCOUNTER — Telehealth (HOSPITAL_COMMUNITY): Payer: Self-pay

## 2015-07-29 DIAGNOSIS — I5022 Chronic systolic (congestive) heart failure: Secondary | ICD-10-CM | POA: Insufficient documentation

## 2015-07-29 NOTE — Telephone Encounter (Signed)
Requested records and paper work for disability and work restrictions faxed to [employer] [phone]

## 2015-07-30 ENCOUNTER — Encounter (HOSPITAL_COMMUNITY)
Admission: RE | Admit: 2015-07-30 | Discharge: 2015-07-30 | Disposition: A | Discharge status: Home / Self Care | Payer: BLUE CROSS/BLUE SHIELD | Source: Ambulatory Visit | Attending: Internal Medicine | Admitting: Internal Medicine

## 2015-07-30 ENCOUNTER — Ambulatory Visit (INDEPENDENT_AMBULATORY_CARE_PROVIDER_SITE_OTHER): Payer: BLUE CROSS/BLUE SHIELD | Admitting: *Deleted

## 2015-07-30 DIAGNOSIS — Z7901 Long term (current) use of anticoagulants: Secondary | ICD-10-CM | POA: Diagnosis not present

## 2015-07-30 DIAGNOSIS — I4891 Unspecified atrial fibrillation: Secondary | ICD-10-CM

## 2015-07-30 DIAGNOSIS — I509 Heart failure, unspecified: Secondary | ICD-10-CM | POA: Insufficient documentation

## 2015-07-30 LAB — POCT INR: INR: 2

## 2015-08-03 ENCOUNTER — Encounter (HOSPITAL_COMMUNITY)
Admission: RE | Admit: 2015-08-03 | Discharge: 2015-08-03 | Disposition: A | Discharge status: Home / Self Care | Payer: BLUE CROSS/BLUE SHIELD | Source: Ambulatory Visit | Attending: Internal Medicine | Admitting: Internal Medicine

## 2015-08-03 DIAGNOSIS — I509 Heart failure, unspecified: Secondary | ICD-10-CM | POA: Diagnosis present

## 2015-08-03 NOTE — Progress Notes (Signed)
Pt started cardiac rehab today.  Pt tolerated light exercise without difficulty. VSS, telemetry-Sr with ST depression, asymptomatic.  Medication list reconciled.  Pt verbalized compliance with medications and denies barriers to compliance. PSYCHOSOCIAL ASSESSMENT:  PHQ-0. Pt exhibits positive coping skills, hopeful outlook with supportive family. Pt feels confident that due to the viral nature of his cardiomyopathy he was unaware of the damage that was occuring to his heart. Pt feels he is getting better and feels it is possible to regain some of his heart function.  Pt has upcoming appt in the heart failure clinic toward the end of December. He is hopeful that exercise will continue to help him and show improvement on his next echo. No psychosocial needs identified at this time, no psychosocial interventions necessary.    Pt enjoys spending time with his daughter and grandson who is 3.   Pt cardiac rehab  goal is  to getting back to work at [employer].  Pt is suppose to return on 12/28.  Pt encouraged to participate in home exercise and education classes to increase ability to achieve these goals.   Pt long term cardiac rehab goal is get the heart back where it was before and reduce swelling.  Will continue to closely monitor pt weight. Pt oriented to exercise equipment and routine.  Understanding verbalized. Alanson Aly, BSN

## 2015-08-04 ENCOUNTER — Telehealth (HOSPITAL_COMMUNITY): Payer: Self-pay

## 2015-08-04 NOTE — Telephone Encounter (Signed)
Sent requested records per fax on 07/29/2015 Requested records and paper work for disability and work restrictions faxed to [employer] [phone]

## 2015-08-05 ENCOUNTER — Encounter (HOSPITAL_COMMUNITY)
Admission: RE | Admit: 2015-08-05 | Discharge: 2015-08-05 | Disposition: A | Discharge status: Home / Self Care | Payer: BLUE CROSS/BLUE SHIELD | Source: Ambulatory Visit | Attending: Internal Medicine | Admitting: Internal Medicine

## 2015-08-05 DIAGNOSIS — I509 Heart failure, unspecified: Secondary | ICD-10-CM | POA: Diagnosis not present

## 2015-08-06 ENCOUNTER — Ambulatory Visit (INDEPENDENT_AMBULATORY_CARE_PROVIDER_SITE_OTHER): Payer: BLUE CROSS/BLUE SHIELD

## 2015-08-06 DIAGNOSIS — Z7901 Long term (current) use of anticoagulants: Secondary | ICD-10-CM

## 2015-08-06 DIAGNOSIS — I4891 Unspecified atrial fibrillation: Secondary | ICD-10-CM

## 2015-08-06 LAB — POCT INR: INR: 2.6

## 2015-08-07 ENCOUNTER — Telehealth (HOSPITAL_COMMUNITY): Payer: Self-pay

## 2015-08-07 ENCOUNTER — Encounter (HOSPITAL_COMMUNITY)
Admission: RE | Admit: 2015-08-07 | Discharge: 2015-08-07 | Disposition: A | Discharge status: Home / Self Care | Payer: BLUE CROSS/BLUE SHIELD | Source: Ambulatory Visit | Attending: Internal Medicine | Admitting: Internal Medicine

## 2015-08-07 DIAGNOSIS — I509 Heart failure, unspecified: Secondary | ICD-10-CM | POA: Diagnosis not present

## 2015-08-07 NOTE — Progress Notes (Signed)
Reviewed home exercise with pt today.  Pt plans to continue walking at home for exercise.  Reviewed THR, pulse (needs practice, plans to attend class in two weeks), RPE, sign and symptoms, and when to call 911 or MD.  Pt voiced understanding. Fabio Pierce, MA, ACSM RCEP

## 2015-08-07 NOTE — Telephone Encounter (Signed)
Disability paper work faxed to [employer] [phone]

## 2015-08-10 ENCOUNTER — Encounter (HOSPITAL_COMMUNITY)
Admission: RE | Admit: 2015-08-10 | Discharge: 2015-08-10 | Disposition: A | Discharge status: Home / Self Care | Payer: BLUE CROSS/BLUE SHIELD | Source: Ambulatory Visit | Attending: Internal Medicine | Admitting: Internal Medicine

## 2015-08-10 DIAGNOSIS — I509 Heart failure, unspecified: Secondary | ICD-10-CM | POA: Diagnosis not present

## 2015-08-12 ENCOUNTER — Encounter (HOSPITAL_COMMUNITY)
Admission: RE | Admit: 2015-08-12 | Discharge: 2015-08-12 | Disposition: A | Discharge status: Home / Self Care | Payer: BLUE CROSS/BLUE SHIELD | Source: Ambulatory Visit | Attending: Internal Medicine | Admitting: Internal Medicine

## 2015-08-12 DIAGNOSIS — I509 Heart failure, unspecified: Secondary | ICD-10-CM | POA: Diagnosis not present

## 2015-08-17 ENCOUNTER — Encounter (HOSPITAL_COMMUNITY)
Admission: RE | Admit: 2015-08-17 | Discharge: 2015-08-17 | Disposition: A | Discharge status: Home / Self Care | Payer: BLUE CROSS/BLUE SHIELD | Source: Ambulatory Visit | Attending: Internal Medicine | Admitting: Internal Medicine

## 2015-08-17 DIAGNOSIS — I509 Heart failure, unspecified: Secondary | ICD-10-CM | POA: Diagnosis not present

## 2015-08-19 ENCOUNTER — Encounter (HOSPITAL_COMMUNITY)
Admission: RE | Admit: 2015-08-19 | Discharge: 2015-08-19 | Disposition: A | Discharge status: Home / Self Care | Payer: BLUE CROSS/BLUE SHIELD | Source: Ambulatory Visit | Attending: Internal Medicine | Admitting: Internal Medicine

## 2015-08-19 DIAGNOSIS — I509 Heart failure, unspecified: Secondary | ICD-10-CM | POA: Diagnosis not present

## 2015-08-20 ENCOUNTER — Ambulatory Visit (INDEPENDENT_AMBULATORY_CARE_PROVIDER_SITE_OTHER): Payer: BLUE CROSS/BLUE SHIELD

## 2015-08-20 DIAGNOSIS — I4891 Unspecified atrial fibrillation: Secondary | ICD-10-CM | POA: Diagnosis not present

## 2015-08-20 DIAGNOSIS — Z7901 Long term (current) use of anticoagulants: Secondary | ICD-10-CM | POA: Diagnosis not present

## 2015-08-20 LAB — POCT INR: INR: 2.7

## 2015-08-21 ENCOUNTER — Encounter (HOSPITAL_COMMUNITY)
Admission: RE | Admit: 2015-08-21 | Discharge: 2015-08-21 | Disposition: A | Discharge status: Home / Self Care | Payer: BLUE CROSS/BLUE SHIELD | Source: Ambulatory Visit | Attending: Internal Medicine | Admitting: Internal Medicine

## 2015-08-21 DIAGNOSIS — I509 Heart failure, unspecified: Secondary | ICD-10-CM | POA: Insufficient documentation

## 2015-08-24 ENCOUNTER — Encounter (HOSPITAL_COMMUNITY)
Admission: RE | Admit: 2015-08-24 | Discharge: 2015-08-24 | Disposition: A | Discharge status: Home / Self Care | Payer: BLUE CROSS/BLUE SHIELD | Source: Ambulatory Visit | Attending: Internal Medicine | Admitting: Internal Medicine

## 2015-08-24 DIAGNOSIS — I509 Heart failure, unspecified: Secondary | ICD-10-CM | POA: Diagnosis not present

## 2015-08-26 ENCOUNTER — Encounter (HOSPITAL_COMMUNITY)
Admission: RE | Admit: 2015-08-26 | Discharge: 2015-08-26 | Disposition: A | Discharge status: Home / Self Care | Payer: BLUE CROSS/BLUE SHIELD | Source: Ambulatory Visit | Attending: Internal Medicine | Admitting: Internal Medicine

## 2015-08-26 DIAGNOSIS — I509 Heart failure, unspecified: Secondary | ICD-10-CM | POA: Diagnosis not present

## 2015-08-26 NOTE — Progress Notes (Signed)
Scarleth Brame RN, BSNWill fax over strips and rehab report for Dr. Gala Romney for review.

## 2015-08-28 ENCOUNTER — Encounter (HOSPITAL_COMMUNITY)
Admission: RE | Admit: 2015-08-28 | Discharge: 2015-08-28 | Disposition: A | Discharge status: Home / Self Care | Payer: BLUE CROSS/BLUE SHIELD | Source: Ambulatory Visit | Attending: Internal Medicine | Admitting: Internal Medicine

## 2015-08-28 ENCOUNTER — Ambulatory Visit (HOSPITAL_COMMUNITY)
Admission: RE | Admit: 2015-08-28 | Discharge: 2015-08-28 | Disposition: A | Discharge status: Home / Self Care | Payer: BLUE CROSS/BLUE SHIELD | Source: Ambulatory Visit | Attending: Cardiology | Admitting: Cardiology

## 2015-08-28 DIAGNOSIS — I5022 Chronic systolic (congestive) heart failure: Secondary | ICD-10-CM

## 2015-08-28 LAB — BASIC METABOLIC PANEL
Anion gap: 10 (ref 5–15)
BUN: 18 mg/dL (ref 6–20)
CO2: 22 mmol/L (ref 22–32)
Calcium: 9.3 mg/dL (ref 8.9–10.3)
Chloride: 104 mmol/L (ref 101–111)
Creatinine, Ser: 1.2 mg/dL (ref 0.61–1.24)
GFR calc Af Amer: >60 mL/min (ref >60)
GFR calc non Af Amer: >60 mL/min (ref >60)
Glucose, Bld: 179 mg/dL — ABNORMAL HIGH (ref 65–99)
Potassium: 4.1 mmol/L (ref 3.5–5.1)
Sodium: 136 mmol/L (ref 135–145)

## 2015-08-28 LAB — MAGNESIUM: Magnesium: 2 mg/dL (ref 1.7–2.4)

## 2015-08-31 ENCOUNTER — Encounter (HOSPITAL_COMMUNITY)
Admission: RE | Admit: 2015-08-31 | Discharge: 2015-08-31 | Disposition: A | Discharge status: Home / Self Care | Payer: BLUE CROSS/BLUE SHIELD | Source: Ambulatory Visit | Attending: Internal Medicine | Admitting: Internal Medicine

## 2015-08-31 DIAGNOSIS — I509 Heart failure, unspecified: Secondary | ICD-10-CM | POA: Diagnosis not present

## 2015-09-02 ENCOUNTER — Encounter (HOSPITAL_COMMUNITY)
Admission: RE | Admit: 2015-09-02 | Discharge: 2015-09-02 | Disposition: A | Discharge status: Home / Self Care | Payer: BLUE CROSS/BLUE SHIELD | Source: Ambulatory Visit | Attending: Internal Medicine | Admitting: Internal Medicine

## 2015-09-02 DIAGNOSIS — I509 Heart failure, unspecified: Secondary | ICD-10-CM | POA: Diagnosis not present

## 2015-09-04 ENCOUNTER — Encounter (HOSPITAL_COMMUNITY)
Admission: RE | Admit: 2015-09-04 | Discharge: 2015-09-04 | Disposition: A | Discharge status: Home / Self Care | Payer: BLUE CROSS/BLUE SHIELD | Source: Ambulatory Visit | Attending: Internal Medicine | Admitting: Internal Medicine

## 2015-09-04 DIAGNOSIS — I509 Heart failure, unspecified: Secondary | ICD-10-CM | POA: Diagnosis not present

## 2015-09-07 ENCOUNTER — Encounter (HOSPITAL_COMMUNITY)
Admission: RE | Admit: 2015-09-07 | Discharge: 2015-09-07 | Disposition: A | Discharge status: Home / Self Care | Payer: BLUE CROSS/BLUE SHIELD | Source: Ambulatory Visit | Attending: Internal Medicine | Admitting: Internal Medicine

## 2015-09-07 DIAGNOSIS — I509 Heart failure, unspecified: Secondary | ICD-10-CM | POA: Diagnosis not present

## 2015-09-09 ENCOUNTER — Ambulatory Visit (HOSPITAL_COMMUNITY)
Admission: RE | Admit: 2015-09-09 | Discharge: 2015-09-09 | Disposition: A | Discharge status: Home / Self Care | Payer: BLUE CROSS/BLUE SHIELD | Source: Ambulatory Visit | Attending: Internal Medicine | Admitting: Internal Medicine

## 2015-09-09 ENCOUNTER — Encounter (HOSPITAL_COMMUNITY)
Admission: RE | Admit: 2015-09-09 | Discharge: 2015-09-09 | Disposition: A | Discharge status: Home / Self Care | Payer: BLUE CROSS/BLUE SHIELD | Source: Ambulatory Visit | Attending: Internal Medicine | Admitting: Internal Medicine

## 2015-09-09 VITALS — BP 131/65 | HR 97 | Ht 64.0 in | Wt 212.8 lb

## 2015-09-09 DIAGNOSIS — Z79899 Other long term (current) drug therapy: Secondary | ICD-10-CM | POA: Diagnosis not present

## 2015-09-09 DIAGNOSIS — Z7901 Long term (current) use of anticoagulants: Secondary | ICD-10-CM | POA: Diagnosis not present

## 2015-09-09 DIAGNOSIS — I509 Heart failure, unspecified: Secondary | ICD-10-CM | POA: Diagnosis not present

## 2015-09-09 DIAGNOSIS — I472 Ventricular tachycardia: Secondary | ICD-10-CM | POA: Insufficient documentation

## 2015-09-09 DIAGNOSIS — I5022 Chronic systolic (congestive) heart failure: Secondary | ICD-10-CM | POA: Diagnosis present

## 2015-09-09 DIAGNOSIS — Z87891 Personal history of nicotine dependence: Secondary | ICD-10-CM | POA: Diagnosis not present

## 2015-09-09 DIAGNOSIS — I2510 Atherosclerotic heart disease of native coronary artery without angina pectoris: Secondary | ICD-10-CM | POA: Diagnosis not present

## 2015-09-09 DIAGNOSIS — I4891 Unspecified atrial fibrillation: Secondary | ICD-10-CM | POA: Insufficient documentation

## 2015-09-09 DIAGNOSIS — I480 Paroxysmal atrial fibrillation: Secondary | ICD-10-CM

## 2015-09-09 LAB — COMPREHENSIVE METABOLIC PANEL
ALT: 31 U/L (ref 17–63)
ANION GAP: 10 (ref 5–15)
AST: 33 U/L (ref 15–41)
Albumin: 3.9 g/dL (ref 3.5–5.0)
Alkaline Phosphatase: 61 U/L (ref 38–126)
BILIRUBIN TOTAL: 0.5 mg/dL (ref 0.3–1.2)
BUN: 18 mg/dL (ref 6–20)
CALCIUM: 9.6 mg/dL (ref 8.9–10.3)
CO2: 27 mmol/L (ref 22–32)
Chloride: 104 mmol/L (ref 101–111)
Creatinine, Ser: 1.22 mg/dL (ref 0.61–1.24)
GFR calc Af Amer: >60 mL/min (ref >60)
Glucose, Bld: 148 mg/dL — ABNORMAL HIGH (ref 65–99)
POTASSIUM: 4.3 mmol/L (ref 3.5–5.1)
Sodium: 141 mmol/L (ref 135–145)
TOTAL PROTEIN: 7.1 g/dL (ref 6.5–8.1)

## 2015-09-09 LAB — TSH: TSH: 5.884 u[IU]/mL — AB (ref 0.350–4.500)

## 2015-09-09 LAB — T4, FREE: Free T4: 0.55 ng/dL — ABNORMAL LOW (ref 0.61–1.12)

## 2015-09-09 MED ORDER — METOPROLOL SUCCINATE ER 25 MG PO TB24: 25.0000 mg | ORAL_TABLET | Freq: Every day | ORAL | Status: DC

## 2015-09-09 NOTE — Progress Notes (Signed)
Advanced Heart Failure Medication Review by a Pharmacist  Does the patient  feel that his/her medications are working for him/her?  yes  Has the patient been experiencing any side effects to the medications prescribed?  no  Does the patient measure his/her own blood pressure or blood glucose at home?  no   Does the patient have any problems obtaining medications due to transportation or finances?   no  Understanding of regimen: good Understanding of indications: good Potential of compliance: good Patient understands to avoid NSAIDs. Patient understands to avoid decongestants.  Issues to address at subsequent visits: None   Pharmacist comments:  [name] is a pleasant 54 yo M presenting without a medication list but with excellent recall of his regimen including dosages. He reports excellent compliance with his regimen and did not have any specific medication-related questions or concerns for me at this time.   [name]. Bonnye Fava, PharmD, BCPS, CPP Clinical Pharmacist Pager: [phone] Phone: [phone] 09/09/2015 2:41 PM      Time with patient: 8 minutes Preparation and documentation time: 2 minutes Total time: 10 minutes

## 2015-09-09 NOTE — Progress Notes (Signed)
[name] 54 y.o. male Nutrition Note Spoke with pt. Nutrition Plan and Nutrition Survey goals reviewed with pt. Pt is following Step 1 of the Therapeutic Lifestyle Changes diet. Pt lives with his parents and his mother is the primary cook. Per pt, "my mom has had to cook low sodium for my dad for a while so everything is either low or no sodium." Pt wants to lose wt. Pt has been trying to lose wt by decreasing portion sizes. Wt loss tips reviewed. Pt with dx of CHF. Per discussion, pt does not use canned/convenience foods often. Pt rarely adds salt to food. Pt eats out infrequently. Pt is on Coumadin and is aware of the need to follow a diet with Consistent Vitamin K intake. Pt expressed understanding of the information reviewed. Pt aware of nutrition education classes offered.  No results found for: HGBA1C Nutrition Diagnosis ? Food-and nutrition-related knowledge deficit related to lack of exposure to information as related to diagnosis of: ? CVD ? Obesity related to excessive energy intake as evidenced by a BMI of 35.4  Nutrition RX/ Estimated Daily Nutrition Needs for: wt loss 1550-2050 Kcal, 40-55 gm fat, 10-14 gm sat fat, 1.5-2.0 gm trans-fat, <1500 mg sodium  Nutrition Intervention ? Pt's individual nutrition plan reviewed with pt. ? Benefits of adopting Therapeutic Lifestyle Changes discussed when Medficts reviewed. ? Pt to attend the Portion Distortion class ? Pt to attend the   ? Nutrition I class                  ? Nutrition II class ? Continue client-centered nutrition education by RD, as part of interdisciplinary care. Goal(s) ? Pt to identify and limit food sources of saturated fat, trans fat, and cholesterol ? Pt to identify food quantities necessary to achieve: ? wt loss to a goal wt of 184-202 lb (83.4-91.6 kg) at graduation from cardiac rehab.  Monitor and Evaluate progress toward nutrition goal with team. Nutrition Risk: Change to Moderate [name], M.Ed, RD,  LDN, CDE 09/09/2015 2:17 PM

## 2015-09-09 NOTE — Progress Notes (Signed)
Patient ID: [name], male   DOB: 08/16/1961, 54 y.o.   MRN: [id number]   Advanced HF CLINIC NOTE  Patient ID: [name], male   DOB: Sep 24, 1960, 53 y.o.   MRN: [id number] PCP: Primary HF Cardiologist: Dr [name]  HPI: [name] is a 54 year old with no known cardiac history admitted 06/25/15 with progressive dyspnea on exertion. An ECHO was performed and showed severly depressed EF. He was diuresed with IV lasix and once optimized he was taken to the cath lab for RHC/ LHC. This showed minimal obstructive CAD, severe NICM, elevated filling pressures, and cardiogenic shock. At that point he was started on milrinone but later weaned off as he improved with adequate mixed venous saturation. cMRI performed and showed-NICM, EF 19%, mod [name], normal RV, and no infiltrative process. Also had A fib RVR but chemically converted with amio 200 mg twice daily. Started on coumadin. (Refused DOAC)  Today he returns for follow up. Feeling great. Going to CR 3x/week and increasing workload steadily. Denies edema, SOB, PND or orthopnea. Weight at home up to 205 (was 198). Appetite much improved since he quit smoking. Intolerant carvedilol due chest pressure. [name] stopped due to hyperkalemia. Stopped using [name] . Refuses statin. No bleeding problems.   Labs 07/14/15: K 5.3 Creatinine 1.98 [name] stopped.  Labs 08/28/15: K 4.1 Creatinine 1.2  ROS: All systems negative except as listed in HPI, PMH and Problem List.  SH:  Social History   Social History  . Marital Status: Divorced    Spouse Name: N/A  . Number of Children: N/A  . Years of Education: N/A   Occupational History  . Not on file.   Social History Main Topics  . Smoking status: Former Smoker    Quit date: 06/25/2015  . Smokeless tobacco: Not on file  . Alcohol Use: 0.0 oz/week    0 Standard drinks or equivalent per week     Comment: weekly  . Drug Use: Not on file  . Sexual Activity: Not on file   Other Topics  Concern  . Not on file   Social History Narrative    FH:  Family History  Problem Relation Age of Onset  . Hypertension Mother     Past Medical History  Diagnosis Date  . Diverticulitis     ACTIVE WITH ABCESS    Current Outpatient Prescriptions  Medication Sig Dispense Refill  . amiodarone (PACERONE) 100 MG tablet Take 100 mg by mouth 2 (two) times daily.    . fluticasone (FLONASE) 50 MCG/ACT nasal spray Place 1 spray into both nostrils daily.    . furosemide (LASIX) 40 MG tablet Take 1 tablet (40 mg total) by mouth daily. 30 tablet 6  . losartan (COZAAR) 25 MG tablet Take 1 tablet (25 mg total) by mouth 2 (two) times daily. 60 tablet 6  . Multiple Vitamins-Minerals (MULTIVITAL PO) Take by mouth daily. MENS MEGA MULTIVIT.     Marland Kitchen warfarin (COUMADIN) 5 MG tablet Take 1 tablet (5 mg total) by mouth daily. 45 tablet 6   No current facility-administered medications for this encounter.    Filed Vitals:   09/09/15 1418  BP: 131/65  Pulse: 97  Height: 5\' 4"  (1.626 m)  Weight: 212 lb 12.8 oz (96.525 kg)  SpO2: 95%    PHYSICAL EXAM: General:  Well appearing. No resp difficulty. Mom and Dad present.  HEENT: normal Neck: supple. JVP 5-6. Carotids 2+ bilaterally; no bruits. No lymphadenopathy or thryomegaly appreciated.  Cor: PMI normal. Regularly irregular rate & rhythm. No rubs, gallops or murmurs. Lungs: clear Abdomen: obese soft, nontender, nondistended. No hepatosplenomegaly. No bruits or masses. Good bowel sounds. Extremities: no cyanosis, clubbing, rash, edema Neuro: alert & orientedx3, cranial nerves grossly intact. Moves all 4 extremities w/o difficulty. Affect pleasant.  ECG: NSR with occasional PVCs  ASSESSMENT & PLAN: 1. Chronic Systolic HF. LVEF 15-20% -> cardiogenic shock --06/2015 [facility]- NICM EF 19% mod [name]. RV normal. No infiltrative process.  --NYHA I. Volume status looks good.  Continue lasix 40 mg daily  --Continue dig.  --Intolerant carvedilol at the lowest  dose due to feeling of doom. Will try Toprol XL 25 qhs --Continue losartan 25 mg twice daily. Can consider Entresto at next visit if he can afford.  --Repeat ECHO next visit --Continue cardiac rehab --Labs today 2. NSVT- on amio. Cut back to 200 mg daily. Taking 100 bid.   3. Morbid obesity 4. H/O sinus tachycardia  5. 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 A fib RVR 07/02/2015 ---CHADS Vasc Score 2. Chemically converted to Sinus Tach on  Amio 200 mg twice daily. Regular pulse today.  continue amio 100 mg bid. On coumadin  7. Former smoker - quit 06/25/2015.   [name] Stjames,MD 2:50 PM

## 2015-09-09 NOTE — Patient Instructions (Signed)
START Toprol XL every night at bedtime.  Routine lab work today. (cmet tsh t4) Will notify you of abnormal results  FOLLOW UP: 1 month (w/ECHO) with Dr.Bensimhon

## 2015-09-10 ENCOUNTER — Ambulatory Visit (INDEPENDENT_AMBULATORY_CARE_PROVIDER_SITE_OTHER): Payer: BLUE CROSS/BLUE SHIELD | Admitting: *Deleted

## 2015-09-10 DIAGNOSIS — Z7901 Long term (current) use of anticoagulants: Secondary | ICD-10-CM

## 2015-09-10 DIAGNOSIS — I4891 Unspecified atrial fibrillation: Secondary | ICD-10-CM | POA: Diagnosis not present

## 2015-09-10 LAB — POCT INR: INR: 2.7

## 2015-09-10 NOTE — Progress Notes (Signed)
QUALITY OF LIFE SCORE REVIEW  Pt completed Quality of Life survey as a participant in Cardiac Rehab. Scores 21.0 or below are considered low. Pt score very low in several areas Overall 21.77, Health and Function 20.80, socioeconomic 25.43, physiological and spiritual 20.07, family 22.60. Patient quality of life slightly altered by physical constraints which limits ability to perform as prior to recent cardiac illness.  Pt comments for lower scores are from the limitations based upon symptoms as a result of chronic and progressive disease process of CHF.   Pt feels good about his management of his CHF and is pleased with his care.  Pt is hopeful that participating in cardiac rehab will continue to improve his stamina and energy.  Offered emotional support and reassurance.  Will continue to monitor and intervene as necessary.  Alanson Aly, BSN

## 2015-09-11 ENCOUNTER — Encounter: Payer: Self-pay | Admitting: Internal Medicine

## 2015-09-11 ENCOUNTER — Encounter (HOSPITAL_COMMUNITY)
Admission: RE | Admit: 2015-09-11 | Discharge: 2015-09-11 | Disposition: A | Discharge status: Home / Self Care | Payer: BLUE CROSS/BLUE SHIELD | Source: Ambulatory Visit | Attending: Internal Medicine | Admitting: Internal Medicine

## 2015-09-11 DIAGNOSIS — I509 Heart failure, unspecified: Secondary | ICD-10-CM | POA: Diagnosis not present

## 2015-09-16 ENCOUNTER — Encounter (HOSPITAL_COMMUNITY)
Admission: RE | Admit: 2015-09-16 | Discharge: 2015-09-16 | Disposition: A | Discharge status: Home / Self Care | Payer: BLUE CROSS/BLUE SHIELD | Source: Ambulatory Visit | Attending: Internal Medicine | Admitting: Internal Medicine

## 2015-09-16 DIAGNOSIS — I509 Heart failure, unspecified: Secondary | ICD-10-CM | POA: Diagnosis not present

## 2015-09-18 ENCOUNTER — Encounter (HOSPITAL_COMMUNITY)
Admission: RE | Admit: 2015-09-18 | Discharge: 2015-09-18 | Disposition: A | Discharge status: Home / Self Care | Payer: BLUE CROSS/BLUE SHIELD | Source: Ambulatory Visit | Attending: Internal Medicine | Admitting: Internal Medicine

## 2015-09-18 DIAGNOSIS — I509 Heart failure, unspecified: Secondary | ICD-10-CM | POA: Diagnosis not present

## 2015-09-23 ENCOUNTER — Encounter (HOSPITAL_COMMUNITY)
Admission: RE | Admit: 2015-09-23 | Discharge: 2015-09-23 | Disposition: A | Discharge status: Home / Self Care | Payer: BLUE CROSS/BLUE SHIELD | Source: Ambulatory Visit | Attending: Internal Medicine | Admitting: Internal Medicine

## 2015-09-23 DIAGNOSIS — I509 Heart failure, unspecified: Secondary | ICD-10-CM | POA: Insufficient documentation

## 2015-09-25 ENCOUNTER — Ambulatory Visit (HOSPITAL_COMMUNITY)
Admission: RE | Admit: 2015-09-25 | Discharge: 2015-09-25 | Disposition: A | Discharge status: Home / Self Care | Payer: BLUE CROSS/BLUE SHIELD | Source: Ambulatory Visit | Attending: Internal Medicine | Admitting: Internal Medicine

## 2015-09-25 ENCOUNTER — Telehealth (HOSPITAL_COMMUNITY): Payer: Self-pay

## 2015-09-25 ENCOUNTER — Encounter (HOSPITAL_COMMUNITY)
Admission: RE | Admit: 2015-09-25 | Discharge: 2015-09-25 | Disposition: A | Discharge status: Home / Self Care | Payer: BLUE CROSS/BLUE SHIELD | Source: Ambulatory Visit | Attending: Internal Medicine | Admitting: Internal Medicine

## 2015-09-25 DIAGNOSIS — I5022 Chronic systolic (congestive) heart failure: Secondary | ICD-10-CM | POA: Diagnosis present

## 2015-09-25 DIAGNOSIS — I509 Heart failure, unspecified: Secondary | ICD-10-CM | POA: Diagnosis not present

## 2015-09-25 LAB — TSH: TSH: 7.101 u[IU]/mL — AB (ref 0.350–4.500)

## 2015-09-25 LAB — T4, FREE: FREE T4: 0.66 ng/dL (ref 0.61–1.12)

## 2015-09-25 NOTE — Telephone Encounter (Signed)
Pt called to see if he could have lab draws done today instead of Monday due to inclement weather expected through the weekend.  Left VM on patient's phone that this would be fine.  Ave Filter

## 2015-09-26 LAB — T3, FREE: T3, Free: 2.8 pg/mL (ref 2.0–4.4)

## 2015-09-28 ENCOUNTER — Encounter (HOSPITAL_COMMUNITY)
Admission: RE | Admit: 2015-09-28 | Discharge: 2015-09-28 | Disposition: A | Discharge status: Home / Self Care | Payer: BLUE CROSS/BLUE SHIELD | Source: Ambulatory Visit | Attending: Internal Medicine | Admitting: Internal Medicine

## 2015-09-28 ENCOUNTER — Other Ambulatory Visit (HOSPITAL_COMMUNITY): Payer: BLUE CROSS/BLUE SHIELD

## 2015-09-28 ENCOUNTER — Encounter (HOSPITAL_COMMUNITY): Payer: Self-pay

## 2015-09-28 DIAGNOSIS — I509 Heart failure, unspecified: Secondary | ICD-10-CM | POA: Diagnosis not present

## 2015-09-28 NOTE — Progress Notes (Signed)
[employer] [place] disability forms completed, signed and faxed by Dr. Arvilla Meres to provided # [phone] along with necessary/requested medical records. Copy of request scanned into patient's electronic medical records.  Ave Filter

## 2015-09-30 ENCOUNTER — Encounter (HOSPITAL_COMMUNITY)
Admission: RE | Admit: 2015-09-30 | Discharge: 2015-09-30 | Disposition: A | Discharge status: Home / Self Care | Payer: BLUE CROSS/BLUE SHIELD | Source: Ambulatory Visit | Attending: Internal Medicine | Admitting: Internal Medicine

## 2015-09-30 DIAGNOSIS — I509 Heart failure, unspecified: Secondary | ICD-10-CM | POA: Diagnosis not present

## 2015-10-02 ENCOUNTER — Encounter (HOSPITAL_COMMUNITY)
Admission: RE | Admit: 2015-10-02 | Discharge: 2015-10-02 | Disposition: A | Discharge status: Home / Self Care | Payer: BLUE CROSS/BLUE SHIELD | Source: Ambulatory Visit | Attending: Internal Medicine | Admitting: Internal Medicine

## 2015-10-02 DIAGNOSIS — I509 Heart failure, unspecified: Secondary | ICD-10-CM | POA: Diagnosis not present

## 2015-10-05 ENCOUNTER — Encounter (HOSPITAL_COMMUNITY)
Admission: RE | Admit: 2015-10-05 | Discharge: 2015-10-05 | Disposition: A | Discharge status: Home / Self Care | Payer: BLUE CROSS/BLUE SHIELD | Source: Ambulatory Visit | Attending: Internal Medicine | Admitting: Internal Medicine

## 2015-10-05 DIAGNOSIS — I509 Heart failure, unspecified: Secondary | ICD-10-CM | POA: Diagnosis not present

## 2015-10-07 ENCOUNTER — Encounter (HOSPITAL_COMMUNITY)
Admission: RE | Admit: 2015-10-07 | Discharge: 2015-10-07 | Disposition: A | Discharge status: Home / Self Care | Payer: BLUE CROSS/BLUE SHIELD | Source: Ambulatory Visit | Attending: Internal Medicine | Admitting: Internal Medicine

## 2015-10-07 DIAGNOSIS — I509 Heart failure, unspecified: Secondary | ICD-10-CM | POA: Diagnosis not present

## 2015-10-08 ENCOUNTER — Ambulatory Visit (INDEPENDENT_AMBULATORY_CARE_PROVIDER_SITE_OTHER): Payer: BLUE CROSS/BLUE SHIELD | Admitting: *Deleted

## 2015-10-08 DIAGNOSIS — I4891 Unspecified atrial fibrillation: Secondary | ICD-10-CM

## 2015-10-08 DIAGNOSIS — Z7901 Long term (current) use of anticoagulants: Secondary | ICD-10-CM | POA: Diagnosis not present

## 2015-10-08 LAB — POCT INR: INR: 3.9

## 2015-10-09 ENCOUNTER — Encounter (HOSPITAL_COMMUNITY)
Admission: RE | Admit: 2015-10-09 | Discharge: 2015-10-09 | Disposition: A | Discharge status: Home / Self Care | Payer: BLUE CROSS/BLUE SHIELD | Source: Ambulatory Visit | Attending: Internal Medicine | Admitting: Internal Medicine

## 2015-10-09 DIAGNOSIS — I509 Heart failure, unspecified: Secondary | ICD-10-CM | POA: Diagnosis not present

## 2015-10-12 ENCOUNTER — Encounter (HOSPITAL_COMMUNITY)
Admission: RE | Admit: 2015-10-12 | Discharge: 2015-10-12 | Disposition: A | Discharge status: Home / Self Care | Payer: BLUE CROSS/BLUE SHIELD | Source: Ambulatory Visit | Attending: Internal Medicine | Admitting: Internal Medicine

## 2015-10-12 DIAGNOSIS — I509 Heart failure, unspecified: Secondary | ICD-10-CM | POA: Diagnosis not present

## 2015-10-13 ENCOUNTER — Ambulatory Visit (HOSPITAL_BASED_OUTPATIENT_CLINIC_OR_DEPARTMENT_OTHER)
Admission: RE | Admit: 2015-10-13 | Discharge: 2015-10-13 | Disposition: A | Discharge status: Home / Self Care | Payer: BLUE CROSS/BLUE SHIELD | Source: Ambulatory Visit | Attending: Internal Medicine | Admitting: Internal Medicine

## 2015-10-13 ENCOUNTER — Ambulatory Visit (HOSPITAL_COMMUNITY)
Admission: RE | Admit: 2015-10-13 | Discharge: 2015-10-13 | Disposition: A | Discharge status: Home / Self Care | Payer: BLUE CROSS/BLUE SHIELD | Source: Ambulatory Visit | Attending: Internal Medicine | Admitting: Internal Medicine

## 2015-10-13 VITALS — BP 128/68 | HR 74 | Wt 221.0 lb

## 2015-10-13 DIAGNOSIS — F17200 Nicotine dependence, unspecified, uncomplicated: Secondary | ICD-10-CM | POA: Diagnosis not present

## 2015-10-13 DIAGNOSIS — I5022 Chronic systolic (congestive) heart failure: Secondary | ICD-10-CM | POA: Diagnosis not present

## 2015-10-13 DIAGNOSIS — I517 Cardiomegaly: Secondary | ICD-10-CM | POA: Diagnosis not present

## 2015-10-13 DIAGNOSIS — I5021 Acute systolic (congestive) heart failure: Secondary | ICD-10-CM | POA: Diagnosis present

## 2015-10-13 DIAGNOSIS — I480 Paroxysmal atrial fibrillation: Secondary | ICD-10-CM | POA: Diagnosis not present

## 2015-10-13 DIAGNOSIS — E785 Hyperlipidemia, unspecified: Secondary | ICD-10-CM | POA: Diagnosis not present

## 2015-10-13 DIAGNOSIS — I10 Essential (primary) hypertension: Secondary | ICD-10-CM | POA: Diagnosis not present

## 2015-10-13 MED ORDER — METOPROLOL SUCCINATE ER 50 MG PO TB24: 50.0000 mg | ORAL_TABLET | Freq: Every day | ORAL | Status: DC

## 2015-10-13 NOTE — Patient Instructions (Signed)
Increase Metoprolol to 50 mg at bedtime  You have been referred to [place], our pharmacist in 3 weeks  We will contact you in 2 months to schedule your next appointment.

## 2015-10-13 NOTE — Addendum Note (Signed)
Encounter addended by: Noralee Space, RN on: 10/13/2015  3:56 PM<BR>     Documentation filed: Patient Instructions Section, Orders

## 2015-10-13 NOTE — Progress Notes (Signed)
*  PRELIMINARY RESULTS* Echocardiogram 2D Echocardiogram has been performed.  Jeryl Columbia 10/13/2015, 3:31 PM

## 2015-10-13 NOTE — Progress Notes (Signed)
Patient ID: [name], male   DOB: 08/16/1961, 55 y.o.   MRN: [id number]   Advanced HF CLINIC NOTE  Patient ID: [name], male   DOB: Jul 26, 1961, 55 y.o.   MRN: [id number] PCP: Primary HF Cardiologist: Dr [name]  HPI: [name] is a 55 year old with no known cardiac history admitted 06/25/15 with progressive dyspnea on exertion. An ECHO was performed and showed severly depressed EF. He was diuresed with IV lasix and once optimized he was taken to the cath lab for RHC/ LHC. This showed minimal obstructive CAD, severe NICM, elevated filling pressures, and cardiogenic shock. At that point he was started on milrinone but later weaned off as he improved with adequate mixed venous saturation. cMRI performed and showed-NICM, EF 19%, mod [name], normal RV, and no infiltrative process. Also had A fib RVR but chemically converted with amio 200 mg twice daily. Started on coumadin. (Refused DOAC)  Today he returns for follow up. Feeling great. Going to CR 3x/week and increasing workload steadily. Denies edema, SOB, PND or orthopnea. Weight at home up to 220 (up about 15 pounds). Appetite much improved since he quit smoking. Intolerant carvedilol due chest pressure. Able to tolerate Toprol 25 qhs. [name] stopped due to hyperkalemia. Refuses statin. No bleeding problems.   ECHO today EF 20-25% RV ok (reviewed personally)  Labs 07/14/15: K 5.3 Creatinine 1.98 [name] stopped.  Labs 08/28/15: K 4.1 Creatinine 1.2  ROS: All systems negative except as listed in HPI, PMH and Problem List.  SH:  Social History   Social History  . Marital Status: Divorced    Spouse Name: N/A  . Number of Children: N/A  . Years of Education: N/A   Occupational History  . Not on file.   Social History Main Topics  . Smoking status: Former Smoker    Quit date: 06/25/2015  . Smokeless tobacco: Not on file  . Alcohol Use: 0.0 oz/week    0 Standard drinks or equivalent per week     Comment: weekly  .  Drug Use: Not on file  . Sexual Activity: Not on file   Other Topics Concern  . Not on file   Social History Narrative    FH:  Family History  Problem Relation Age of Onset  . Hypertension Mother     Past Medical History  Diagnosis Date  . Diverticulitis     ACTIVE WITH ABCESS    Current Outpatient Prescriptions  Medication Sig Dispense Refill  . amiodarone (PACERONE) 100 MG tablet Take 100 mg by mouth 2 (two) times daily.    . fluticasone (FLONASE) 50 MCG/ACT nasal spray Place 1 spray into both nostrils daily.    . furosemide (LASIX) 40 MG tablet Take 1 tablet (40 mg total) by mouth daily. 30 tablet 6  . losartan (COZAAR) 25 MG tablet Take 1 tablet (25 mg total) by mouth 2 (two) times daily. 60 tablet 6  . metoprolol succinate (TOPROL XL) 25 MG 24 hr tablet Take 1 tablet (25 mg total) by mouth at bedtime. 30 tablet 3  . Multiple Vitamins-Minerals (MULTIVITAL PO) Take by mouth daily. MENS MEGA MULTIVIT.     Marland Kitchen Probiotic Product (PROBIOTIC DAILY PO) Take 1 capsule by mouth daily.    Marland Kitchen warfarin (COUMADIN) 5 MG tablet Take 1 tablet (5 mg total) by mouth daily. 45 tablet 6   No current facility-administered medications for this encounter.    Filed Vitals:   10/13/15 1516  BP: 128/68  Pulse:  74  Weight: 221 lb (100.245 kg)  SpO2: 96%    PHYSICAL EXAM: General:  Well appearing. No resp difficulty. Mom and Dad present.  HEENT: normal Neck: supple. JVP 5-6. Carotids 2+ bilaterally; no bruits. No lymphadenopathy or thryomegaly appreciated. Cor: PMI normal. Regular. No rubs, gallops or murmurs. Lungs: clear Abdomen: obese soft, nontender, nondistended. No hepatosplenomegaly. No bruits or masses. Good bowel sounds. Extremities: no cyanosis, clubbing, rash, edema Neuro: alert & orientedx3, cranial nerves grossly intact. Moves all 4 extremities w/o difficulty. Affect pleasant.   ASSESSMENT & PLAN: 1. Chronic Systolic HF. LVEF 15-20% -> cardiogenic shock --06/2015 [facility]- NICM  EF 19% mod [name]. RV normal. No infiltrative process.  - Echo today EF 20-25% RV ok.  --NYHA I. Volume status looks good.  Continue lasix 40 mg daily  --Intolerant carvedilol at the lowest dose due to feeling of doom. Tolerating Toprol 25 qhs will go up to 50 qhs.  --Continue losartan 25 mg twice daily. Cannot afford Entresto. Increase losartan at next visit.  --Continue cardiac rehab 2. NSVT- on amio. Cut back to 200 mg daily. Taking 100 bid.   3. Morbid obesity 4. H/O sinus tachycardia  5. 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 A fib RVR 07/02/2015 ---CHADS Vasc Score 2. Maintaining NSR on amio 100 mg bid. On coumadin  7. Former smoker - quit 06/25/2015.   [name] Atwater,MD 3:22 PM

## 2015-10-14 ENCOUNTER — Encounter (HOSPITAL_COMMUNITY)
Admission: RE | Admit: 2015-10-14 | Discharge: 2015-10-14 | Disposition: A | Discharge status: Home / Self Care | Payer: BLUE CROSS/BLUE SHIELD | Source: Ambulatory Visit | Attending: Internal Medicine | Admitting: Internal Medicine

## 2015-10-14 DIAGNOSIS — I509 Heart failure, unspecified: Secondary | ICD-10-CM | POA: Diagnosis not present

## 2015-10-16 ENCOUNTER — Encounter (HOSPITAL_COMMUNITY)
Admission: RE | Admit: 2015-10-16 | Discharge: 2015-10-16 | Disposition: A | Discharge status: Home / Self Care | Payer: BLUE CROSS/BLUE SHIELD | Source: Ambulatory Visit | Attending: Internal Medicine | Admitting: Internal Medicine

## 2015-10-16 DIAGNOSIS — I509 Heart failure, unspecified: Secondary | ICD-10-CM | POA: Diagnosis not present

## 2015-10-19 ENCOUNTER — Encounter (HOSPITAL_COMMUNITY)
Admission: RE | Admit: 2015-10-19 | Discharge: 2015-10-19 | Disposition: A | Discharge status: Home / Self Care | Payer: BLUE CROSS/BLUE SHIELD | Source: Ambulatory Visit | Attending: Internal Medicine | Admitting: Internal Medicine

## 2015-10-19 ENCOUNTER — Encounter (HOSPITAL_COMMUNITY): Payer: Self-pay | Admitting: *Deleted

## 2015-10-19 DIAGNOSIS — I509 Heart failure, unspecified: Secondary | ICD-10-CM | POA: Diagnosis not present

## 2015-10-19 NOTE — Progress Notes (Signed)
Completed form from [employer] regarding pt's leave, form along with his medical records faxed back to them at [phone]

## 2015-10-21 ENCOUNTER — Encounter (HOSPITAL_COMMUNITY)
Admission: RE | Admit: 2015-10-21 | Discharge: 2015-10-21 | Disposition: A | Discharge status: Home / Self Care | Payer: BLUE CROSS/BLUE SHIELD | Source: Ambulatory Visit | Attending: Internal Medicine | Admitting: Internal Medicine

## 2015-10-21 DIAGNOSIS — I509 Heart failure, unspecified: Secondary | ICD-10-CM | POA: Diagnosis not present

## 2015-10-23 ENCOUNTER — Encounter (HOSPITAL_COMMUNITY)
Admission: RE | Admit: 2015-10-23 | Discharge: 2015-10-23 | Disposition: A | Discharge status: Home / Self Care | Payer: BLUE CROSS/BLUE SHIELD | Source: Ambulatory Visit | Attending: Internal Medicine | Admitting: Internal Medicine

## 2015-10-23 DIAGNOSIS — I509 Heart failure, unspecified: Secondary | ICD-10-CM | POA: Diagnosis not present

## 2015-10-26 ENCOUNTER — Telehealth (HOSPITAL_COMMUNITY): Payer: Self-pay | Admitting: Family Medicine

## 2015-10-26 ENCOUNTER — Encounter (HOSPITAL_COMMUNITY): Payer: BLUE CROSS/BLUE SHIELD

## 2015-10-28 ENCOUNTER — Encounter (HOSPITAL_COMMUNITY)
Admission: RE | Admit: 2015-10-28 | Discharge: 2015-10-28 | Disposition: A | Discharge status: Home / Self Care | Payer: BLUE CROSS/BLUE SHIELD | Source: Ambulatory Visit | Attending: Internal Medicine | Admitting: Internal Medicine

## 2015-10-28 DIAGNOSIS — I509 Heart failure, unspecified: Secondary | ICD-10-CM | POA: Diagnosis not present

## 2015-10-29 ENCOUNTER — Ambulatory Visit (INDEPENDENT_AMBULATORY_CARE_PROVIDER_SITE_OTHER): Payer: BLUE CROSS/BLUE SHIELD | Admitting: *Deleted

## 2015-10-29 DIAGNOSIS — I4891 Unspecified atrial fibrillation: Secondary | ICD-10-CM | POA: Diagnosis not present

## 2015-10-29 DIAGNOSIS — Z7901 Long term (current) use of anticoagulants: Secondary | ICD-10-CM | POA: Diagnosis not present

## 2015-10-29 LAB — POCT INR: INR: 3.3

## 2015-10-30 ENCOUNTER — Encounter (HOSPITAL_COMMUNITY)
Admission: RE | Admit: 2015-10-30 | Discharge: 2015-10-30 | Disposition: A | Discharge status: Home / Self Care | Payer: BLUE CROSS/BLUE SHIELD | Source: Ambulatory Visit | Attending: Internal Medicine | Admitting: Internal Medicine

## 2015-10-30 DIAGNOSIS — I509 Heart failure, unspecified: Secondary | ICD-10-CM | POA: Diagnosis not present

## 2015-11-02 ENCOUNTER — Ambulatory Visit (HOSPITAL_COMMUNITY)
Admission: RE | Admit: 2015-11-02 | Discharge: 2015-11-02 | Disposition: A | Discharge status: Home / Self Care | Payer: BLUE CROSS/BLUE SHIELD | Source: Ambulatory Visit | Attending: Internal Medicine | Admitting: Internal Medicine

## 2015-11-02 ENCOUNTER — Encounter (HOSPITAL_COMMUNITY)
Admission: RE | Admit: 2015-11-02 | Discharge: 2015-11-02 | Disposition: A | Discharge status: Home / Self Care | Payer: BLUE CROSS/BLUE SHIELD | Source: Ambulatory Visit | Attending: Internal Medicine | Admitting: Internal Medicine

## 2015-11-02 VITALS — BP 144/76 | HR 70 | Wt 222.0 lb

## 2015-11-02 DIAGNOSIS — I10 Essential (primary) hypertension: Secondary | ICD-10-CM

## 2015-11-02 DIAGNOSIS — Z7901 Long term (current) use of anticoagulants: Secondary | ICD-10-CM | POA: Diagnosis not present

## 2015-11-02 DIAGNOSIS — I5021 Acute systolic (congestive) heart failure: Secondary | ICD-10-CM

## 2015-11-02 DIAGNOSIS — I110 Hypertensive heart disease with heart failure: Secondary | ICD-10-CM | POA: Diagnosis not present

## 2015-11-02 DIAGNOSIS — I428 Other cardiomyopathies: Secondary | ICD-10-CM | POA: Diagnosis not present

## 2015-11-02 DIAGNOSIS — I4891 Unspecified atrial fibrillation: Secondary | ICD-10-CM | POA: Diagnosis not present

## 2015-11-02 DIAGNOSIS — I480 Paroxysmal atrial fibrillation: Secondary | ICD-10-CM

## 2015-11-02 DIAGNOSIS — Z79899 Other long term (current) drug therapy: Secondary | ICD-10-CM | POA: Insufficient documentation

## 2015-11-02 DIAGNOSIS — E785 Hyperlipidemia, unspecified: Secondary | ICD-10-CM | POA: Insufficient documentation

## 2015-11-02 DIAGNOSIS — I5022 Chronic systolic (congestive) heart failure: Secondary | ICD-10-CM

## 2015-11-02 LAB — BASIC METABOLIC PANEL
Anion gap: 12 (ref 5–15)
BUN: 15 mg/dL (ref 6–20)
CALCIUM: 9.3 mg/dL (ref 8.9–10.3)
CHLORIDE: 106 mmol/L (ref 101–111)
CO2: 23 mmol/L (ref 22–32)
CREATININE: 1.18 mg/dL (ref 0.61–1.24)
Glucose, Bld: 86 mg/dL (ref 65–99)
Potassium: 4.3 mmol/L (ref 3.5–5.1)
SODIUM: 141 mmol/L (ref 135–145)

## 2015-11-02 MED ORDER — SACUBITRIL-VALSARTAN 49-51 MG PO TABS: 1.0000 | ORAL_TABLET | Freq: Two times a day (BID) | ORAL | Status: DC

## 2015-11-02 NOTE — Progress Notes (Signed)
Patient ID: [name], male   DOB: 1961/03/03, 55 y.o.   MRN: [id number]   HPI:  [name] is a 55 year old with no known cardiac history admitted 06/25/15 with progressive dyspnea on exertion. An ECHO was performed and showed severly depressed EF. He was diuresed with IV lasix and once optimized he was taken to the cath lab for RHC/ LHC. This showed minimal obstructive CAD, severe NICM, elevated filling pressures, and cardiogenic shock. At that point he was started on milrinone but later weaned off as he improved with adequate mixed venous saturation. cMRI performed and showed-NICM, EF 19%, mod MR, normal RV, and no infiltrative process. Also had A fib RVR but chemically converted with amio 200 mg twice daily. Started on coumadin. (Refused DOAC)  He presents today in the pharmacy clinic for HF medication titration. At last HF clinic visit on 1/24, his Toprol was increased to 50 mg QHS.  Marland Kitchen Shortness of breath/dyspnea on exertion? No - last day of CR, steadily increasing exercise tolerance . Orthopnea/PND? no . Edema? no . Lightheadedness/dizziness? no . Daily weights at home? Yes - weights staying ~220 lb . Blood pressure/heart rate monitoring at home? yes . Following low-sodium/fluid-restricted diet? Yes - mom makes low sodium foods and doesn't add salts; limits to 1 soda/day or water, coffee  HF Medications: Metoprolol succinate ER 50 mg PO QHS Losartan 25 mg PO BID  Furosemide 40 mg PO daily    Contraindications/Intolerances: Carvedilol - chest pressure Spironolactone - hyperkalemia  Has the patient been experiencing any side effects to the medications prescribed?  No  Does the patient have any problems obtaining medications due to transportation or finances?   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 Patient understands to avoid decongestants.     Pertinent Lab Values: . 11/02/15 - Serum creatinine 1.18 (BL ~1, CrCl 96 ml/min), CO2 23, Potassium 4.3, Sodium 141 . BNP 678 (06/26/15), Magnesium 2.0 (08/28/2015), Digoxin 2.4 (07/28/15 - discontinued)   Vital Signs: . Weight: 222 lb (last visit weight: 221 lb; dry weight: ~220 lb) . Blood pressure: 144/76 mmHg  . Heart rate: 70 bpm   Heart Failure Assessment & Plan: . Ejection fraction: 25-30% . NYHA symptom class: I-II . Based on clinical presentation, vital signs, and recent labs, will recommend to discontinue losartan and start Entresto 49-51 mg PO BID tomorrow.   Summary: 1. Chronic systolic heart failure 2. Hypertension 3. Hyperlipidemia 4. Atrial fibrillation  During today's visit, all of [name]'s HF medications were reviewed with him at length and he verbalized understanding of the importance of consistent utilization. He reports good compliance with his regimen. I re-emphasized the importance of continuing a low sodium diet and fluid intake of <2L/day. He was congratulated on his continued smoking cessation. He does admit to having his appetite back since quitting and has admittedly gained weight because of this. He is exercising through cardiac rehab and we discussed the benefits of a mediterranean diet to help with weight loss.  To optimize his regimen since he has BP and BMET today stable, I have recommended a switch from losartan to Entresto 49-51 mg PO BID. We discussed side effects to look out for including excessive dizziness and drops in BP. I have provided him with a 30-day free trial offer as well as a coupon card which should help with his copays once it goes through his [facility].  1) Medication changes: Discontinue losartan and start Entresto 49-51 mg PO BID  2) Labs: BMET today and in 2 weeks 3) Follow-up: Dr. Gala Romney on 12/14/2015   [name]. Bonnye Fava, PharmD, BCPS, CPP Clinical Pharmacist Pager: [phone] Phone: [phone] 11/02/2015 12:40  PM   Case discussed with Dr. Bonnye Fava. Agree with plan as above.  Lei Dower,MD 5:21 PM

## 2015-11-02 NOTE — Progress Notes (Signed)
Pt graduated from cardiac rehab program today with completion of 36 exercise sessions in Phase II. Pt maintained good attendance and progressed nicely during his participation in rehab as evidenced by increased MET level. Pt met level increased from 3.2 to 4.2.   Medication list reconciled. Repeat  PHQ score-0  .  Pt has made significant lifestyle changes and should be commended for his success. Pt feels he has made progress toward meeting his goals during cardiac rehab. Pt has not returned back to work per recommendations made by his cardiologist - Bensimhon.  Pt is awaiting his next echocardiogram which he hopes will show further increase,  This is the benchmark Dr. Haroldine Laws is using for his release to return to work Pt long term goal was to get his heart back where it was before and reduce swelling.  Pt weight is stable and pt last EF showed and increase to 25%.   Pt plans to continue exercise at [facility] where there is similar equipment to what he uses here.  This is affordable to pt at 10.00 a month.  Pt plans to exercise 5 x a week.  It was a true delight to have this patient participate in cardiac rehab!! Maurice Small RN, BSN

## 2015-11-02 NOTE — Patient Instructions (Signed)
It was great to see you!  Please STOP taking losartan and start Entresto 49-51 mg by mouth TWICE DAILY  Please make an appointment for LABS in 2 WEEKS  Keep your appointment with Dr. Gala Romney on 12/14/2015

## 2015-11-03 ENCOUNTER — Telehealth (HOSPITAL_COMMUNITY): Payer: Self-pay | Admitting: Pharmacist

## 2015-11-03 NOTE — Telephone Encounter (Signed)
Entresto 49-51 mg did not require PA through [facility]. Verified with pharmacy that copay will be $104/mo but with coupon card will be $10/mo.   [name]. Bonnye Fava, PharmD, BCPS, CPP Clinical Pharmacist Pager: [phone] Phone: [phone] 11/03/2015 4:04 PM

## 2015-11-04 ENCOUNTER — Encounter (HOSPITAL_COMMUNITY): Payer: BLUE CROSS/BLUE SHIELD

## 2015-11-06 ENCOUNTER — Encounter (HOSPITAL_COMMUNITY): Payer: BLUE CROSS/BLUE SHIELD

## 2015-11-09 ENCOUNTER — Encounter (HOSPITAL_COMMUNITY): Payer: Self-pay

## 2015-11-09 ENCOUNTER — Telehealth (HOSPITAL_COMMUNITY): Payer: Self-pay | Admitting: Cardiology

## 2015-11-09 NOTE — Telephone Encounter (Signed)
Patient called with concerns regarding increased b/p Patient reports he was ordered to start entresto a week ago however he was dealing with the loss of a family member and was unable to start until 2/19  Patient reports after only 1 dose his b/p has increased B/p  Today 154/79 b/p on 2/12 109/60 Denies sob, dizziness, chest pain, blurred vision,HA  Advised to continue checking b/p daily, if b/p continues to stay elevated return call for further instructions/ dose increase Also advised to call immediatly if symptoms develop,  this may be brought on loss of family- continue to monitor

## 2015-11-09 NOTE — Progress Notes (Signed)
Multiple forms/paperwork completed, signed, and faxed by Dr. Suella Grove on behalf of patient along with necessary supportive medical records to [facility] at provided # [id number] Claim # [id number] Copy of forms scanned into patient's electronic medical records.  Ave Filter

## 2015-11-12 ENCOUNTER — Ambulatory Visit (INDEPENDENT_AMBULATORY_CARE_PROVIDER_SITE_OTHER): Payer: BLUE CROSS/BLUE SHIELD | Admitting: *Deleted

## 2015-11-12 DIAGNOSIS — I4891 Unspecified atrial fibrillation: Secondary | ICD-10-CM | POA: Diagnosis not present

## 2015-11-12 DIAGNOSIS — Z7901 Long term (current) use of anticoagulants: Secondary | ICD-10-CM

## 2015-11-12 LAB — POCT INR: INR: 3.5

## 2015-11-16 ENCOUNTER — Other Ambulatory Visit (HOSPITAL_COMMUNITY): Payer: BLUE CROSS/BLUE SHIELD

## 2015-11-23 ENCOUNTER — Ambulatory Visit (HOSPITAL_COMMUNITY)
Admission: RE | Admit: 2015-11-23 | Discharge: 2015-11-23 | Disposition: A | Discharge status: Home / Self Care | Payer: BLUE CROSS/BLUE SHIELD | Source: Ambulatory Visit | Attending: Cardiology | Admitting: Cardiology

## 2015-11-23 DIAGNOSIS — I5022 Chronic systolic (congestive) heart failure: Secondary | ICD-10-CM | POA: Diagnosis not present

## 2015-11-23 LAB — BASIC METABOLIC PANEL
ANION GAP: 9 (ref 5–15)
BUN: 19 mg/dL (ref 6–20)
CALCIUM: 9.3 mg/dL (ref 8.9–10.3)
CO2: 25 mmol/L (ref 22–32)
Chloride: 106 mmol/L (ref 101–111)
Creatinine, Ser: 1.19 mg/dL (ref 0.61–1.24)
GFR calc Af Amer: >60 mL/min (ref >60)
GLUCOSE: 141 mg/dL — AB (ref 65–99)
Potassium: 4.9 mmol/L (ref 3.5–5.1)
Sodium: 140 mmol/L (ref 135–145)

## 2015-11-26 ENCOUNTER — Ambulatory Visit (INDEPENDENT_AMBULATORY_CARE_PROVIDER_SITE_OTHER): Payer: BLUE CROSS/BLUE SHIELD | Admitting: *Deleted

## 2015-11-26 DIAGNOSIS — Z7901 Long term (current) use of anticoagulants: Secondary | ICD-10-CM | POA: Diagnosis not present

## 2015-11-26 DIAGNOSIS — I4891 Unspecified atrial fibrillation: Secondary | ICD-10-CM | POA: Diagnosis not present

## 2015-11-26 LAB — POCT INR: INR: 2.5

## 2015-12-14 ENCOUNTER — Ambulatory Visit (HOSPITAL_COMMUNITY)
Admission: RE | Admit: 2015-12-14 | Discharge: 2015-12-14 | Disposition: A | Discharge status: Home / Self Care | Payer: BLUE CROSS/BLUE SHIELD | Source: Ambulatory Visit | Attending: Internal Medicine | Admitting: Internal Medicine

## 2015-12-14 VITALS — BP 138/80 | HR 65 | Wt 226.5 lb

## 2015-12-14 DIAGNOSIS — Z8249 Family history of ischemic heart disease and other diseases of the circulatory system: Secondary | ICD-10-CM | POA: Diagnosis not present

## 2015-12-14 DIAGNOSIS — Z79899 Other long term (current) drug therapy: Secondary | ICD-10-CM | POA: Diagnosis not present

## 2015-12-14 DIAGNOSIS — I428 Other cardiomyopathies: Secondary | ICD-10-CM | POA: Insufficient documentation

## 2015-12-14 DIAGNOSIS — Z87891 Personal history of nicotine dependence: Secondary | ICD-10-CM | POA: Diagnosis not present

## 2015-12-14 DIAGNOSIS — I480 Paroxysmal atrial fibrillation: Secondary | ICD-10-CM

## 2015-12-14 DIAGNOSIS — I4891 Unspecified atrial fibrillation: Secondary | ICD-10-CM | POA: Insufficient documentation

## 2015-12-14 DIAGNOSIS — Z7901 Long term (current) use of anticoagulants: Secondary | ICD-10-CM | POA: Insufficient documentation

## 2015-12-14 DIAGNOSIS — I5022 Chronic systolic (congestive) heart failure: Secondary | ICD-10-CM | POA: Insufficient documentation

## 2015-12-14 DIAGNOSIS — I472 Ventricular tachycardia: Secondary | ICD-10-CM | POA: Diagnosis not present

## 2015-12-14 MED ORDER — AMIODARONE HCL 200 MG PO TABS: 100.0000 mg | ORAL_TABLET | Freq: Every day | ORAL | Status: DC

## 2015-12-14 NOTE — Patient Instructions (Signed)
Decrease Amiodarone to 100 mg (1/2 tab) daily  Your physician recommends that you schedule a follow-up appointment in: 2 months

## 2015-12-14 NOTE — Progress Notes (Signed)
Patient ID: [name], male   DOB: Sep 30, 1960, 55 y.o.   MRN: [id number]   Advanced HF CLINIC NOTE  Patient ID: [name], male   DOB: June 17, 1961, 55 y.o.   MRN: [id number] PCP: Primary HF Cardiologist: Dr [name]  HPI: [name] is a 55 year old with no known cardiac history admitted 06/25/15 with progressive dyspnea on exertion. An ECHO was performed and showed severly depressed EF. He was diuresed with IV lasix and once optimized he was taken to the cath lab for RHC/ LHC. This showed minimal obstructive CAD, severe NICM, elevated filling pressures, and cardiogenic shock. At that point he was started on milrinone but later weaned off as he improved with adequate mixed venous saturation. cMRI performed and showed-NICM, EF 19%, mod [name], normal RV, and no infiltrative process. Also had A fib RVR but chemically converted with amio 200 mg twice daily. Started on coumadin. (Refused DOAC)  At last visit losartan switched to Entresto 49-51 mg PO BID  Today he returns for follow up. Feeling great. Going to gym 5-6x/week for 2 hours per day. Exercises without problem. Has been gaining weight after quitting smoking. Denies edema, SOB, PND or orthopnea.  Intolerant carvedilol due chest pressure. Able to tolerate Toprol 50 qhs. [name] stopped due to hyperkalemia. Refuses statin. No bleeding problems with coumadin. SBP at home 90-120  ECHO 1/17  EF 25-30% RV ok  Labs 07/14/15: K 5.3 Creatinine 1.98 [name] stopped.  Labs 08/28/15: K 4.1 Creatinine 1.2  ROS: All systems negative except as listed in HPI, PMH and Problem List.  SH:  Social History   Social History  . Marital Status: Divorced    Spouse Name: N/A  . Number of Children: N/A  . Years of Education: N/A   Occupational History  . Not on file.   Social History Main Topics  . Smoking status: Former Smoker    Quit date: 06/25/2015  . Smokeless tobacco: Not on file  . Alcohol Use: 0.0 oz/week    0 Standard drinks or  equivalent per week     Comment: weekly  . Drug Use: Not on file  . Sexual Activity: Not on file   Other Topics Concern  . Not on file   Social History Narrative    FH:  Family History  Problem Relation Age of Onset  . Hypertension Mother     Past Medical History  Diagnosis Date  . Diverticulitis     ACTIVE WITH ABCESS    Current Outpatient Prescriptions  Medication Sig Dispense Refill  . amiodarone (PACERONE) 200 MG tablet Take 100 mg by mouth 2 (two) times daily.    . fluticasone (FLONASE) 50 MCG/ACT nasal spray Place 1 spray into both nostrils daily.    . furosemide (LASIX) 40 MG tablet Take 1 tablet (40 mg total) by mouth daily. 30 tablet 6  . metoprolol succinate (TOPROL-XL) 50 MG 24 hr tablet Take 1 tablet (50 mg total) by mouth at bedtime. 30 tablet 6  . Multiple Vitamins-Minerals (MULTIVITAL PO) Take by mouth daily. MENS MEGA MULTIVIT.     Marland Kitchen Probiotic Product (PROBIOTIC DAILY PO) Take 1 capsule by mouth daily.    . sacubitril-valsartan (ENTRESTO) 49-51 MG Take 1 tablet by mouth 2 (two) times daily. 60 tablet 11  . warfarin (COUMADIN) 5 MG tablet Take 1 tablet (5 mg total) by mouth daily. 45 tablet 6   No current facility-administered medications for this encounter.    Filed Vitals:   12/14/15  1010  BP: 138/80  Pulse: 65  Weight: 226 lb 8 oz (102.74 kg)  SpO2: 96%    PHYSICAL EXAM: General:  Well appearing. No resp difficulty.  HEENT: normal Neck: supple. JVP flat. Carotids 2+ bilaterally; no bruits. No lymphadenopathy or thryomegaly appreciated. Cor: PMI normal. Regular. No rubs, gallops or murmurs. Lungs: clear Abdomen: obese soft, nontender, nondistended. No hepatosplenomegaly. No bruits or masses. Good bowel sounds. Extremities: no cyanosis, clubbing, rash, edema Neuro: alert & orientedx3, cranial nerves grossly intact. Moves all 4 extremities w/o difficulty. Affect pleasant.   ASSESSMENT & PLAN: 1. Chronic Systolic HF. LVEF 15-20% -> cardiogenic  shock --06/2015 [facility]RI- NICM EF 19% mod [name]. RV normal. No infiltrative process.  - Echo 1/17 EF 25-30% RV ok.  --NYHA I. Volume status looks good.  Continue lasix 40 mg daily  --Intolerant carvedilol at the lowest dose due to feeling of doom. Tolerating Toprol 50 qhs.  --Continue Enrtresto 49-51 bid. Will not increase today as SBP can drop into low 90s. Increase at next visit.  --EF getting better and NYHA I. So we are holding off on ICD for now. Repeat echo 6/17 2. NSVT- on amio.Taking 100 bid. Will take amio to 100 daily. Continue coumadin. 3. Morbid obesity 4. 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 A fib RVR 07/02/2015 ---CHADS Vasc Score 2. Maintaining NSR on amio 100 mg bid. On coumadin  6. Former smoker - quit 06/25/2015.   RTC 6-8 weeks.  Bensimhon, Daniel,MD 10:26 AM

## 2015-12-14 NOTE — Addendum Note (Signed)
Encounter addended by: Noralee Space, RN on: 12/14/2015 10:47 AM<BR>     Documentation filed: Patient Instructions Section, Orders

## 2015-12-17 ENCOUNTER — Ambulatory Visit (INDEPENDENT_AMBULATORY_CARE_PROVIDER_SITE_OTHER): Payer: BLUE CROSS/BLUE SHIELD | Admitting: *Deleted

## 2015-12-17 DIAGNOSIS — I4891 Unspecified atrial fibrillation: Secondary | ICD-10-CM | POA: Diagnosis not present

## 2015-12-17 DIAGNOSIS — Z7901 Long term (current) use of anticoagulants: Secondary | ICD-10-CM | POA: Diagnosis not present

## 2015-12-17 LAB — POCT INR: INR: 2.4

## 2015-12-25 ENCOUNTER — Encounter (HOSPITAL_COMMUNITY): Payer: Self-pay

## 2015-12-25 NOTE — Progress Notes (Signed)
[employer] medical record request received on behalf of patient regarding disability claim. All records 09/09/2015-present faxed to provided # [id number] as requested. Copy of request scanned into patient's electronic medical record.  Ave Filter

## 2015-12-31 ENCOUNTER — Ambulatory Visit (INDEPENDENT_AMBULATORY_CARE_PROVIDER_SITE_OTHER): Payer: BLUE CROSS/BLUE SHIELD | Admitting: *Deleted

## 2015-12-31 DIAGNOSIS — I480 Paroxysmal atrial fibrillation: Secondary | ICD-10-CM

## 2015-12-31 DIAGNOSIS — I4891 Unspecified atrial fibrillation: Secondary | ICD-10-CM | POA: Diagnosis not present

## 2015-12-31 DIAGNOSIS — Z7901 Long term (current) use of anticoagulants: Secondary | ICD-10-CM | POA: Diagnosis not present

## 2015-12-31 LAB — POCT INR: INR: 2.5

## 2016-01-25 ENCOUNTER — Telehealth (HOSPITAL_COMMUNITY): Payer: Self-pay

## 2016-01-25 NOTE — Telephone Encounter (Signed)
Patient reports chipped a tooth and may need dental extraction, has apt with dentist tomorrow. Advised to have dentist call or fax our office recommended procedure and findings to give cardiac clearance as necessary. Aware and agreeable.  Ave Filter

## 2016-01-27 ENCOUNTER — Other Ambulatory Visit (HOSPITAL_COMMUNITY): Payer: Self-pay | Admitting: Adult Health

## 2016-02-01 ENCOUNTER — Telehealth (HOSPITAL_COMMUNITY): Payer: Self-pay | Admitting: *Deleted

## 2016-02-01 NOTE — Telephone Encounter (Signed)
Pt called to let us know he is sch to have his tooth extracted and they will give him a prescription for Ultram for pain afterwards and wants to make sure it's ok to take with his other meds.  Advised Ultram prn is fine

## 2016-02-02 ENCOUNTER — Ambulatory Visit (INDEPENDENT_AMBULATORY_CARE_PROVIDER_SITE_OTHER): Payer: BLUE CROSS/BLUE SHIELD | Admitting: *Deleted

## 2016-02-02 DIAGNOSIS — Z7901 Long term (current) use of anticoagulants: Secondary | ICD-10-CM

## 2016-02-02 DIAGNOSIS — I4891 Unspecified atrial fibrillation: Secondary | ICD-10-CM

## 2016-02-02 LAB — POCT INR: INR: 2.7

## 2016-02-15 NOTE — Progress Notes (Signed)
Patient ID: [name], male   DOB: 1961-01-18, 55 y.o.   MRN: [id number]    Advanced Heart Failure Clinic Note   PCP: Dr Modesto Charon Primary HF Cardiologist: Dr Gala Romney  HPI: [name] is a 55 year old with no known cardiac history admitted 06/25/15 with progressive dyspnea on exertion. An ECHO was performed and showed severly depressed EF. He was diuresed with IV lasix and once optimized he was taken to the cath lab for RHC/ LHC. This showed minimal obstructive CAD, severe NICM, elevated filling pressures, and cardiogenic shock. At that point he was started on milrinone but later weaned off as he improved with adequate mixed venous saturation. cMRI performed and showed-NICM, EF 19%, mod [name], normal RV, and no infiltrative process. Also had A fib RVR but chemically converted with amio 200 mg twice daily. Started on coumadin. (Refused DOAC)  Today he returns for regular follow up. Recently switched to [facility].  Not increased at last visit with occasional hypotension. Weight up 6 lbs from last visit. SBPs in home around 120-130s usually now. (as low as 112-113 after working out). Overall feels "pretty good" Works out at least 5 to 7 days a week up to 2 hours, doing lightweights and treadmills/Nustep. No smoking since 06/25/2015. No edema, SOB, PND, or orthopnea. Hasn't needed any extra lasix. No bleeding on warfarin.   ECHO 1/17  EF 25-30% RV ok  Labs 07/14/15: K 5.3 Creatinine 1.98 [name] stopped.  Labs 08/28/15: K 4.1 Creatinine 1.2  ROS: All systems negative except as listed in HPI, PMH and Problem List.  SH:  Social History   Social History  . Marital Status: Divorced    Spouse Name: N/A  . Number of Children: N/A  . Years of Education: N/A   Occupational History  . Not on file.   Social History Main Topics  . Smoking status: Former Smoker    Quit date: 06/25/2015  . Smokeless tobacco: Not on file  . Alcohol Use: 0.0 oz/week    0 Standard drinks or equivalent per week   Comment: weekly  . Drug Use: Not on file  . Sexual Activity: Not on file   Other Topics Concern  . Not on file   Social History Narrative    FH:  Family History  Problem Relation Age of Onset  . Hypertension Mother     Past Medical History  Diagnosis Date  . Diverticulitis     ACTIVE WITH ABCESS    Current Outpatient Prescriptions  Medication Sig Dispense Refill  . amiodarone (PACERONE) 200 MG tablet Take 0.5 tablets (100 mg total) by mouth daily.    . fluticasone (FLONASE) 50 MCG/ACT nasal spray Place 1 spray into both nostrils daily.    . furosemide (LASIX) 40 MG tablet TAKE ONE TABLET BY MOUTH ONCE DAILY 30 tablet 3  . metoprolol succinate (TOPROL-XL) 50 MG 24 hr tablet Take 1 tablet (50 mg total) by mouth at bedtime. 30 tablet 6  . Multiple Vitamins-Minerals (MULTIVITAL PO) Take by mouth daily. MENS MEGA MULTIVIT.     Marland Kitchen Probiotic Product (PROBIOTIC DAILY PO) Take 1 capsule by mouth daily.    . sacubitril-valsartan (ENTRESTO) 49-51 MG Take 1 tablet by mouth 2 (two) times daily. 60 tablet 11  . warfarin (COUMADIN) 5 MG tablet Take 1 tablet (5 mg total) by mouth daily. 45 tablet 6   No current facility-administered medications for this encounter.    Filed Vitals:   02/16/16 1011  BP: 158/82  Pulse:  67  Weight: 232 lb 9.6 oz (105.507 kg)  SpO2: 97%   Wt Readings from Last 3 Encounters:  02/16/16 232 lb 9.6 oz (105.507 kg)  12/14/15 226 lb 8 oz (102.74 kg)  11/02/15 222 lb (100.699 kg)    PHYSICAL EXAM: General:  Well appearing. No resp difficulty.  HEENT: normal Neck: supple. JVP difficult but does not appear elevated. Carotids 2+ bilaterally; no bruits. No thyromegaly or nodule noted.  Cor: PMI normal. Regular. No rubs, gallops or murmurs. Lungs: CTAB, normal effort Abdomen: obese, soft, NT, ND, no HSM. No bruits or masses. +BS  Extremities: no cyanosis, clubbing, rash, Trace ankle edema.  Neuro: alert & orientedx3, cranial nerves grossly intact. Moves all 4  extremities w/o difficulty. Affect pleasant.   ASSESSMENT & PLAN: 1. Chronic Systolic HF. LVEF 15-20% -> cardiogenic shock --06/2015 CMRI- NICM EF 19% mod [name]. RV normal. No infiltrative process.  - Echo 1/17 EF 25-30% RV ok.  --NYHA I. Volume status stable on exam. Continue lasix 40 mg daily  --Intolerant carvedilol at the lowest dose due to feeling of doom. Tolerating Toprol 50 qhs.  --Increase Enrtresto to 97/103 BID. BMET today and recheck in 10 days (next Friday) --EF getting better and NYHA I. So we are holding off on ICD for now. Repeat echo 6/17 2. NSVT- on amio.Taking 100 bid. Will take amio to 100 daily. Continue coumadin. 3. Morbid obesity - watching his diet and increasing exercise as able.  4. 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 A fib RVR 07/02/2015 ---CHADS Vasc Score 2. Maintaining NSR on amio 100 mg bid. On coumadin per coumadin clinic.  6. Former smoker - quit 06/25/2015.   Increase entresto as above.  BMET today and recheck 10 days.   Follow up 6-8 weeks with Echo.   Graciella Freer, PA-C 10:40 AM

## 2016-02-16 ENCOUNTER — Ambulatory Visit (HOSPITAL_COMMUNITY)
Admission: RE | Admit: 2016-02-16 | Discharge: 2016-02-16 | Disposition: A | Discharge status: Home / Self Care | Payer: BLUE CROSS/BLUE SHIELD | Source: Ambulatory Visit | Attending: Cardiology | Admitting: Cardiology

## 2016-02-16 VITALS — BP 158/82 | HR 67 | Wt 232.6 lb

## 2016-02-16 DIAGNOSIS — I472 Ventricular tachycardia: Secondary | ICD-10-CM | POA: Diagnosis not present

## 2016-02-16 DIAGNOSIS — E669 Obesity, unspecified: Secondary | ICD-10-CM

## 2016-02-16 DIAGNOSIS — Z79899 Other long term (current) drug therapy: Secondary | ICD-10-CM | POA: Insufficient documentation

## 2016-02-16 DIAGNOSIS — I5022 Chronic systolic (congestive) heart failure: Secondary | ICD-10-CM | POA: Diagnosis not present

## 2016-02-16 DIAGNOSIS — Z8249 Family history of ischemic heart disease and other diseases of the circulatory system: Secondary | ICD-10-CM | POA: Insufficient documentation

## 2016-02-16 DIAGNOSIS — I428 Other cardiomyopathies: Secondary | ICD-10-CM | POA: Diagnosis not present

## 2016-02-16 DIAGNOSIS — E785 Hyperlipidemia, unspecified: Secondary | ICD-10-CM | POA: Diagnosis not present

## 2016-02-16 DIAGNOSIS — Z87891 Personal history of nicotine dependence: Secondary | ICD-10-CM | POA: Diagnosis not present

## 2016-02-16 DIAGNOSIS — I4891 Unspecified atrial fibrillation: Secondary | ICD-10-CM | POA: Insufficient documentation

## 2016-02-16 DIAGNOSIS — Z7901 Long term (current) use of anticoagulants: Secondary | ICD-10-CM | POA: Insufficient documentation

## 2016-02-16 DIAGNOSIS — I480 Paroxysmal atrial fibrillation: Secondary | ICD-10-CM

## 2016-02-16 LAB — BASIC METABOLIC PANEL
ANION GAP: 7 (ref 5–15)
BUN: 12 mg/dL (ref 6–20)
CHLORIDE: 106 mmol/L (ref 101–111)
CO2: 25 mmol/L (ref 22–32)
Calcium: 9.5 mg/dL (ref 8.9–10.3)
Creatinine, Ser: 1.07 mg/dL (ref 0.61–1.24)
GFR calc non Af Amer: >60 mL/min (ref >60)
GLUCOSE: 127 mg/dL — AB (ref 65–99)
Potassium: 4.7 mmol/L (ref 3.5–5.1)
Sodium: 138 mmol/L (ref 135–145)

## 2016-02-16 MED ORDER — SACUBITRIL-VALSARTAN 97-103 MG PO TABS: 1.0000 | ORAL_TABLET | Freq: Two times a day (BID) | ORAL | Status: DC

## 2016-02-16 NOTE — Patient Instructions (Signed)
INCREASE Entresto to 97/103 mg, one tab twice a day  Labs today and again in 10 days  Your physician recommends that you schedule a follow-up appointment in: 6-8 weeks with Dr.Bensimhon and echo  Your physician has requested that you have an echocardiogram. Echocardiography is a painless test that uses sound waves to create images of your heart. It provides your doctor with information about the size and shape of your heart and how well your heart's chambers and valves are working. This procedure takes approximately one hour. There are no restrictions for this procedure.  Do the following things EVERYDAY: 1) Weigh yourself in the morning before breakfast. Write it down and keep it in a log. 2) Take your medicines as prescribed 3) Eat low salt foods-Limit salt (sodium) to 2000 mg per day.  4) Stay as active as you can everyday 5) Limit all fluids for the day to less than 2 liters.

## 2016-02-26 ENCOUNTER — Ambulatory Visit (HOSPITAL_COMMUNITY)
Admission: RE | Admit: 2016-02-26 | Discharge: 2016-02-26 | Disposition: A | Discharge status: Home / Self Care | Payer: BLUE CROSS/BLUE SHIELD | Source: Ambulatory Visit | Attending: Cardiology | Admitting: Cardiology

## 2016-02-26 DIAGNOSIS — I5022 Chronic systolic (congestive) heart failure: Secondary | ICD-10-CM | POA: Diagnosis not present

## 2016-02-26 LAB — BASIC METABOLIC PANEL
ANION GAP: 8 (ref 5–15)
BUN: 13 mg/dL (ref 6–20)
CALCIUM: 9.6 mg/dL (ref 8.9–10.3)
CHLORIDE: 106 mmol/L (ref 101–111)
CO2: 24 mmol/L (ref 22–32)
Creatinine, Ser: 1.22 mg/dL (ref 0.61–1.24)
GFR calc Af Amer: >60 mL/min (ref >60)
GFR calc non Af Amer: >60 mL/min (ref >60)
Glucose, Bld: 151 mg/dL — ABNORMAL HIGH (ref 65–99)
Potassium: 4.6 mmol/L (ref 3.5–5.1)
Sodium: 138 mmol/L (ref 135–145)

## 2016-03-03 ENCOUNTER — Ambulatory Visit (INDEPENDENT_AMBULATORY_CARE_PROVIDER_SITE_OTHER): Payer: BLUE CROSS/BLUE SHIELD

## 2016-03-03 DIAGNOSIS — Z7901 Long term (current) use of anticoagulants: Secondary | ICD-10-CM

## 2016-03-03 DIAGNOSIS — I4891 Unspecified atrial fibrillation: Secondary | ICD-10-CM

## 2016-03-03 LAB — POCT INR: INR: 2.2

## 2016-04-13 ENCOUNTER — Ambulatory Visit (HOSPITAL_BASED_OUTPATIENT_CLINIC_OR_DEPARTMENT_OTHER)
Admission: RE | Admit: 2016-04-13 | Discharge: 2016-04-13 | Disposition: A | Discharge status: Home / Self Care | Payer: BLUE CROSS/BLUE SHIELD | Source: Ambulatory Visit | Attending: Family Medicine | Admitting: Family Medicine

## 2016-04-13 ENCOUNTER — Encounter (HOSPITAL_COMMUNITY): Payer: Self-pay

## 2016-04-13 ENCOUNTER — Ambulatory Visit (INDEPENDENT_AMBULATORY_CARE_PROVIDER_SITE_OTHER): Payer: BLUE CROSS/BLUE SHIELD | Admitting: Pharmacist

## 2016-04-13 ENCOUNTER — Ambulatory Visit (HOSPITAL_COMMUNITY)
Admission: RE | Admit: 2016-04-13 | Discharge: 2016-04-13 | Disposition: A | Discharge status: Home / Self Care | Payer: BLUE CROSS/BLUE SHIELD | Source: Ambulatory Visit | Attending: Cardiology | Admitting: Cardiology

## 2016-04-13 VITALS — BP 126/72 | HR 72 | Wt 231.0 lb

## 2016-04-13 DIAGNOSIS — I5021 Acute systolic (congestive) heart failure: Secondary | ICD-10-CM | POA: Diagnosis not present

## 2016-04-13 DIAGNOSIS — Z6839 Body mass index (BMI) 39.0-39.9, adult: Secondary | ICD-10-CM | POA: Diagnosis not present

## 2016-04-13 DIAGNOSIS — Z87891 Personal history of nicotine dependence: Secondary | ICD-10-CM | POA: Diagnosis not present

## 2016-04-13 DIAGNOSIS — Z79899 Other long term (current) drug therapy: Secondary | ICD-10-CM | POA: Diagnosis not present

## 2016-04-13 DIAGNOSIS — I4891 Unspecified atrial fibrillation: Secondary | ICD-10-CM | POA: Diagnosis not present

## 2016-04-13 DIAGNOSIS — Z7901 Long term (current) use of anticoagulants: Secondary | ICD-10-CM | POA: Diagnosis not present

## 2016-04-13 DIAGNOSIS — I471 Supraventricular tachycardia: Secondary | ICD-10-CM | POA: Insufficient documentation

## 2016-04-13 DIAGNOSIS — I2510 Atherosclerotic heart disease of native coronary artery without angina pectoris: Secondary | ICD-10-CM | POA: Diagnosis not present

## 2016-04-13 DIAGNOSIS — Z8249 Family history of ischemic heart disease and other diseases of the circulatory system: Secondary | ICD-10-CM | POA: Insufficient documentation

## 2016-04-13 DIAGNOSIS — I428 Other cardiomyopathies: Secondary | ICD-10-CM | POA: Insufficient documentation

## 2016-04-13 DIAGNOSIS — I5022 Chronic systolic (congestive) heart failure: Secondary | ICD-10-CM | POA: Insufficient documentation

## 2016-04-13 LAB — BRAIN NATRIURETIC PEPTIDE: B NATRIURETIC PEPTIDE 5: 117.2 pg/mL — AB (ref 0.0–100.0)

## 2016-04-13 LAB — POCT INR: INR: 2.3

## 2016-04-13 LAB — TSH: TSH: 6.03 u[IU]/mL — AB (ref 0.350–4.500)

## 2016-04-13 LAB — ECHOCARDIOGRAM COMPLETE: WEIGHTICAEL: 3696 [oz_av]

## 2016-04-13 LAB — COMPREHENSIVE METABOLIC PANEL
ALT: 38 U/L (ref 17–63)
ANION GAP: 9 (ref 5–15)
AST: 33 U/L (ref 15–41)
Albumin: 4.5 g/dL (ref 3.5–5.0)
Alkaline Phosphatase: 55 U/L (ref 38–126)
BUN: 17 mg/dL (ref 6–20)
CHLORIDE: 106 mmol/L (ref 101–111)
CO2: 20 mmol/L — ABNORMAL LOW (ref 22–32)
Calcium: 9.5 mg/dL (ref 8.9–10.3)
Creatinine, Ser: 1.17 mg/dL (ref 0.61–1.24)
Glucose, Bld: 119 mg/dL — ABNORMAL HIGH (ref 65–99)
POTASSIUM: 4.3 mmol/L (ref 3.5–5.1)
Sodium: 135 mmol/L (ref 135–145)
Total Bilirubin: 1 mg/dL (ref 0.3–1.2)
Total Protein: 7.4 g/dL (ref 6.5–8.1)

## 2016-04-13 MED ORDER — METOPROLOL SUCCINATE ER 50 MG PO TB24
ORAL_TABLET | ORAL | 6 refills | Status: DC
Start: 2016-04-13 — End: 2016-04-14

## 2016-04-13 MED ORDER — PERFLUTREN LIPID MICROSPHERE
1.0000 mL | INTRAVENOUS | Status: AC | PRN
  Administered 2016-04-13: 2 mL via INTRAVENOUS

## 2016-04-13 MED ORDER — PERFLUTREN LIPID MICROSPHERE
INTRAVENOUS | Status: AC
  Filled 2016-04-13: qty 10

## 2016-04-13 NOTE — Patient Instructions (Signed)
Increase Metoprolol to 75 mg (1 & 1/2 tabs) daily at bedtime  Labs today  Your physician recommends that you schedule a follow-up appointment in: 2-3 months

## 2016-04-13 NOTE — Progress Notes (Signed)
  Echocardiogram 2D Echocardiogram with Definity has been performed.  Nolon Rod 04/13/2016, 12:43 PM

## 2016-04-13 NOTE — Progress Notes (Signed)
Patient ID: [name], male   DOB: 19-Mar-1961, 55 y.o.   MRN: [id number]   Advanced HF CLINIC NOTE  Patient ID: [name], male   DOB: Dec 11, 1960, 55 y.o.   MRN: [id number] PCP: Primary HF Cardiologist: Dr Gala Romney  HPI: [name] is a 55 year old with no known cardiac history admitted 06/25/15 with progressive dyspnea on exertion. An ECHO was performed and showed severly depressed EF. He was diuresed with IV lasix and once optimized he was taken to the cath lab for RHC/ LHC. This showed minimal obstructive CAD, severe NICM, elevated filling pressures, and cardiogenic shock. At that point he was started on milrinone but later weaned off as he improved with adequate mixed venous saturation. cMRI performed and showed-NICM, EF 19%, mod MR, normal RV, and no infiltrative process. Also had A fib RVR but chemically converted with amio 200 mg twice daily. Started on coumadin. (Refused DOAC)  At last visit losartan switched to Entresto 49-51 mg PO BID  Today he returns for follow up. Feeling great. Going to gym at [employer] 5-6x/week for 1-2 hours per day. Exercises without problem.  Doing aerobic exercise (Nu-Step, elliptical and bike) and doing weights. Gained 25 pounds after he stopped smoking. But weight stable last 2 months. Denies edema, SOB, PND or orthopnea.  Intolerant carvedilol due chest pressure. Able to tolerate Toprol 50 qhs. [name] stopped due to hyperkalemia. Refuses statin. No bleeding problems with coumadin. On Entresto 97/103. SBP at home 106-120  ECHO today reviewed personally EF 35-40% on Definity images (30-35% without Definity)   ECHO 1/17  EF 25-30% RV ok    Labs 07/14/15: K 5.3 Creatinine 1.98 [name] stopped.  Labs 08/28/15: K 4.1 Creatinine 1.2  ROS: All systems negative except as listed in HPI, PMH and Problem List.  SH:  Social History   Social History  . Marital status: Divorced    Spouse name: N/A  . Number of children: N/A  . Years of  education: N/A   Occupational History  . Not on file.   Social History Main Topics  . Smoking status: Former Smoker    Quit date: 06/25/2015  . Smokeless tobacco: Not on file  . Alcohol use 0.0 oz/week     Comment: weekly  . Drug use: Unknown  . Sexual activity: Not on file   Other Topics Concern  . Not on file   Social History Narrative  . No narrative on file    FH:  Family History  Problem Relation Age of Onset  . Hypertension Mother     Past Medical History:  Diagnosis Date  . Diverticulitis    ACTIVE WITH ABCESS    Current Outpatient Prescriptions  Medication Sig Dispense Refill  . amiodarone (PACERONE) 200 MG tablet Take 0.5 tablets (100 mg total) by mouth daily.    . fluticasone (FLONASE) 50 MCG/ACT nasal spray Place 1 spray into both nostrils daily.    . furosemide (LASIX) 40 MG tablet TAKE ONE TABLET BY MOUTH ONCE DAILY 30 tablet 3  . metoprolol succinate (TOPROL-XL) 50 MG 24 hr tablet Take 1 tablet (50 mg total) by mouth at bedtime. 30 tablet 6  . Multiple Vitamins-Minerals (MULTIVITAL PO) Take by mouth daily. MENS MEGA MULTIVIT.     Marland Kitchen Probiotic Product (PROBIOTIC DAILY PO) Take 1 capsule by mouth daily.    . sacubitril-valsartan (ENTRESTO) 97-103 MG Take 1 tablet by mouth 2 (two) times daily. 60 tablet 6  . warfarin (COUMADIN) 5  MG tablet Take 1 tablet (5 mg total) by mouth daily. 45 tablet 6   No current facility-administered medications for this encounter.    Facility-Administered Medications Ordered in Other Encounters  Medication Dose Route Frequency Provider Last Rate Last Dose  . perflutren lipid microspheres (DEFINITY) IV suspension  1-10 mL Intravenous PRN Laurey Morale, MD   2 mL at 04/13/16 1128    Vitals:   04/13/16 1139  BP: 126/72  Pulse: 72  SpO2: 98%  Weight: 231 lb (104.8 kg)    PHYSICAL EXAM: General:  Well appearing. No resp difficulty.  HEENT: normal Neck: supple. JVP flat. Carotids 2+ bilaterally; no bruits. No  lymphadenopathy or thryomegaly appreciated. Cor: PMI normal. Regular. No rubs, gallops or murmurs. Lungs: clear Abdomen: obese soft, nontender, nondistended. No hepatosplenomegaly. No bruits or masses. Good bowel sounds. Extremities: no cyanosis, clubbing, rash, edema Neuro: alert & orientedx3, cranial nerves grossly intact. Moves all 4 extremities w/o difficulty. Affect pleasant.   ASSESSMENT & PLAN: 1. Chronic Systolic HF. LVEF 15-20% -> cardiogenic shock --06/2015 CMRI- NICM EF 19% mod MR. RV normal. No infiltrative process.  - Echo today with EF 30-40% (looked better with Definity)  --NYHA I. Volume status looks good.  Continue lasix 40 mg daily  --Intolerant carvedilol at the lowest dose due to feeling of doom. Tolerating Toprol 50 qhs. Will increase to 75mg  qhs.   --Continue goal-dose Entresto 97-103 bid. Consider spiro soon.  --EF getting better and NYHA I. So we are holding off on ICD for now. (Patient is reluctant about ICD as well). Given difficulty with EF on echo can consider repeat cMRI as needed for EF only.  2. NSVT- on amio 100 daily. Continue coumadin. 3. Morbid obesity 4. 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 A fib RVR 07/02/2015 ---CHADS Vasc Score 2. Maintaining NSR on amio 100 mg/d. On coumadin  6. Former smoker - quit 06/25/2015.   RTC 2-3 months.   Davinia Riccardi,MD 12:08 PM

## 2016-04-14 ENCOUNTER — Telehealth (HOSPITAL_COMMUNITY): Payer: Self-pay | Admitting: *Deleted

## 2016-04-14 MED ORDER — METOPROLOL SUCCINATE ER 50 MG PO TB24: ORAL_TABLET | ORAL | 6 refills | Status: AC

## 2016-04-14 NOTE — Telephone Encounter (Signed)
Pt called to let us know that he couldn't handle the 75 mg of Metoprolol. He said that it felt like he was falling when lying down. He said that he took her BP and it was fine at 106 over something. He said that it made him feel completely awful and would go back to the 50 mg daily.

## 2016-05-03 ENCOUNTER — Encounter (HOSPITAL_COMMUNITY): Payer: Self-pay

## 2016-05-03 NOTE — Progress Notes (Signed)
Medical Record request faxed to CHF clinic dated 04/21/2016. Completed for dates 12/14/2015--present. Total of 26 pages return faxed to provided # [phone] Copt of request scanned into patient's electronic medical record.  Ave Filter, RN

## 2016-05-04 ENCOUNTER — Telehealth (HOSPITAL_COMMUNITY): Payer: Self-pay

## 2016-05-04 NOTE — Telephone Encounter (Signed)
Patient called CHF clinic triage to confirm [facility] Disability paperwork had been faxed from our office. Called back patient to confirm this was sent yesterday.  Ave Filter, RN

## 2016-05-25 ENCOUNTER — Ambulatory Visit (INDEPENDENT_AMBULATORY_CARE_PROVIDER_SITE_OTHER): Payer: BLUE CROSS/BLUE SHIELD | Admitting: *Deleted

## 2016-05-25 ENCOUNTER — Other Ambulatory Visit (HOSPITAL_COMMUNITY): Payer: Self-pay | Admitting: Adult Health

## 2016-05-25 DIAGNOSIS — I4891 Unspecified atrial fibrillation: Secondary | ICD-10-CM | POA: Diagnosis not present

## 2016-05-25 DIAGNOSIS — Z7901 Long term (current) use of anticoagulants: Secondary | ICD-10-CM

## 2016-05-25 LAB — POCT INR: INR: 2.2

## 2016-06-14 ENCOUNTER — Encounter (HOSPITAL_COMMUNITY): Payer: Self-pay | Admitting: Internal Medicine

## 2016-06-14 ENCOUNTER — Encounter (HOSPITAL_COMMUNITY): Payer: Self-pay | Admitting: *Deleted

## 2016-06-14 ENCOUNTER — Ambulatory Visit (HOSPITAL_COMMUNITY)
Admission: RE | Admit: 2016-06-14 | Discharge: 2016-06-14 | Disposition: A | Discharge status: Home / Self Care | Payer: BLUE CROSS/BLUE SHIELD | Source: Ambulatory Visit | Attending: Internal Medicine | Admitting: Internal Medicine

## 2016-06-14 VITALS — BP 142/80 | HR 70 | Wt 240.5 lb

## 2016-06-14 DIAGNOSIS — E669 Obesity, unspecified: Secondary | ICD-10-CM | POA: Diagnosis not present

## 2016-06-14 DIAGNOSIS — I5022 Chronic systolic (congestive) heart failure: Secondary | ICD-10-CM | POA: Diagnosis present

## 2016-06-14 DIAGNOSIS — I4891 Unspecified atrial fibrillation: Secondary | ICD-10-CM | POA: Diagnosis not present

## 2016-06-14 DIAGNOSIS — I429 Cardiomyopathy, unspecified: Secondary | ICD-10-CM | POA: Diagnosis not present

## 2016-06-14 DIAGNOSIS — E875 Hyperkalemia: Secondary | ICD-10-CM | POA: Diagnosis not present

## 2016-06-14 DIAGNOSIS — Z87891 Personal history of nicotine dependence: Secondary | ICD-10-CM | POA: Insufficient documentation

## 2016-06-14 DIAGNOSIS — Z79899 Other long term (current) drug therapy: Secondary | ICD-10-CM | POA: Insufficient documentation

## 2016-06-14 DIAGNOSIS — Z7901 Long term (current) use of anticoagulants: Secondary | ICD-10-CM | POA: Diagnosis not present

## 2016-06-14 DIAGNOSIS — I472 Ventricular tachycardia: Secondary | ICD-10-CM | POA: Insufficient documentation

## 2016-06-14 MED ORDER — SPIRONOLACTONE 25 MG PO TABS: 12.5000 mg | ORAL_TABLET | Freq: Every day | ORAL | 3 refills | Status: DC

## 2016-06-14 NOTE — Progress Notes (Signed)
Per Dr Gala Romney pt can return to work with light duty, including no extreme temps (hot or cold), no lifting/carrying/push/pull more than 25 lb..  Pt's form completed and faxed to [facility] at [phone], pt aware and copy

## 2016-06-14 NOTE — Patient Instructions (Signed)
START Spironolactone 12.5mg  (1/2 tablet) daily.  Lab work in 1 week.  Follow up in 4 months with Dr.Bensimhon.

## 2016-06-14 NOTE — Addendum Note (Signed)
Encounter addended by: Modesta Messing, CMA on: 06/14/2016 12:17 PM<BR>    Actions taken: Order Entry activity accessed, Sign clinical note

## 2016-06-14 NOTE — Progress Notes (Signed)
Patient ID: [name] DillRichard M Bergeson, male   DOB: 05/22/1961, 55 y.o.   MRN: [id number]   Advanced HF CLINIC NOTE  Patient ID: [name], male   DOB: 05/22/1961, 55 y.o.   MRN: [id number] PCP: Primary HF Cardiologist: Dr Gala RomneyBensimhon  HPI: Mr Esmond CamperMcGilvery is a 55 year old with no known cardiac history admitted 06/25/15 with progressive dyspnea on exertion. An ECHO was performed and showed severly depressed EF. He was diuresed with IV lasix and once optimized he was taken to the cath lab for RHC/ LHC. This showed minimal obstructive CAD, severe NICM, elevated filling pressures, and cardiogenic shock. At that point he was started on milrinone but later weaned off as he improved with adequate mixed venous saturation. cMRI performed and showed-NICM, EF 19%, mod MR, normal RV, and no infiltrative process. Also had A fib RVR but chemically converted with amio 200 mg twice daily. Started on coumadin. (Refused DOAC)  At last visit losartan switched to Entresto 49-51 mg PO BID  Today he returns for follow up. Feeling good. Going to gym at [employer] 5-6x/week for 1-2 hours per day. Exercises without problem.  Doing aerobic exercise (Nu-Step, elliptical and bike) and doing weights. Not doing TM as much due belly getting in way. Gained 30 pounds after he stopped smoking. Denies edema, SOB, PND or orthopnea.  Intolerant carvedilol due chest pressure. Able to tolerate Toprol 50 qhs. Cleda DaubSpiro stopped due to hyperkalemia. Refuses statin. No bleeding problems with coumadin. On Entresto 97/103. SBP at home 105-120. Wants to get off warfarin.   ECHO 7/17 EF 30%  Seems to be 35-40% on Definity images  ECHO 1/17  EF 25-30% RV ok    Labs 07/14/15: K 5.3 Creatinine 1.98 Spiro stopped.  Labs 08/28/15: K 4.1 Creatinine 1.2  ROS: All systems negative except as listed in HPI, PMH and Problem List.  SH:  Social History   Social History  . Marital status: Divorced    Spouse name: N/A  . Number of children: N/A  .  Years of education: N/A   Occupational History  . Not on file.   Social History Main Topics  . Smoking status: Former Smoker    Quit date: 06/25/2015  . Smokeless tobacco: Not on file  . Alcohol use 0.0 oz/week     Comment: weekly  . Drug use: Unknown  . Sexual activity: Not on file   Other Topics Concern  . Not on file   Social History Narrative  . No narrative on file    FH:  Family History  Problem Relation Age of Onset  . Hypertension Mother     Past Medical History:  Diagnosis Date  . Diverticulitis    ACTIVE WITH ABCESS    Current Outpatient Prescriptions  Medication Sig Dispense Refill  . amiodarone (PACERONE) 200 MG tablet Take 0.5 tablets (100 mg total) by mouth daily.    . fluticasone (FLONASE) 50 MCG/ACT nasal spray Place 1 spray into both nostrils daily.    . furosemide (LASIX) 40 MG tablet TAKE ONE TABLET BY MOUTH ONCE DAILY 30 tablet 3  . metoprolol succinate (TOPROL-XL) 50 MG 24 hr tablet Take 1 tablet once daily 30 tablet 6  . Multiple Vitamins-Minerals (MULTIVITAL PO) Take by mouth daily. MENS MEGA MULTIVIT.     Marland Kitchen. Probiotic Product (PROBIOTIC DAILY PO) Take 1 capsule by mouth daily.    . sacubitril-valsartan (ENTRESTO) 97-103 MG Take 1 tablet by mouth 2 (two) times daily. 60 tablet 6  .  warfarin (COUMADIN) 5 MG tablet Take 1 tablet (5 mg total) by mouth daily. 45 tablet 6   No current facility-administered medications for this encounter.     Vitals:   06/14/16 1103  BP: (!) 142/80  Pulse: 70  SpO2: 98%  Weight: 240 lb 8 oz (109.1 kg)    PHYSICAL EXAM: General:  Well appearing. No resp difficulty.  HEENT: normal Neck: supple. JVP flat. Carotids 2+ bilaterally; no bruits. No lymphadenopathy or thryomegaly appreciated. Cor: PMI normal. Regular. No rubs, gallops or murmurs. Lungs: clear Abdomen: obese soft, nontender, nondistended. No hepatosplenomegaly. No bruits or masses. Good bowel sounds. Large ventral hernia in RLQ Extremities: no  cyanosis, clubbing, rash, edema Neuro: alert & orientedx3, cranial nerves grossly intact. Moves all 4 extremities w/o difficulty. Affect pleasant.   ASSESSMENT & PLAN: 1. Chronic Systolic HF. LVEF 15-20% -> cardiogenic shock --06/2015 CMRI- NICM EF 19% mod MR. RV normal. No infiltrative process.  - Echo 7/17 EF 30% --NYHA I. Volume status looks good.  Continue lasix 40 mg daily  --Intolerant carvedilol at the lowest dose due to feeling of doom.  --On toprol 50mg  qhs failed titration to 75mg  several times --Continue goal-dose Entresto 97-103 bid.   --Start spiro 12.5 BMET in 1 week.  --EF getting better and NYHA I. So we are holding off on ICD for now. (Patient is reluctant about ICD as well). Given difficulty with EF on echo can consider repeat cMRI as needed for EF only.  2. NSVT- on amio 100 daily. Continue coumadin. 3. Morbid obesity 4. 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 A fib RVR 07/02/2015 ---CHADS Vasc Score 2. Maintaining NSR on amio 100 mg/d. On coumadin. Very adamant that he wants to get off. Will place 30-day event monitor if no AF can consider stopping coumadin  6. Former smoker - quit 06/25/2015.  7. Returned to work- we have cleared him for light duty.   RTC 3 months.   Jian Hodgman,MD 12:02 PM

## 2016-06-24 ENCOUNTER — Ambulatory Visit (HOSPITAL_COMMUNITY)
Admission: RE | Admit: 2016-06-24 | Discharge: 2016-06-24 | Disposition: A | Discharge status: Home / Self Care | Payer: BLUE CROSS/BLUE SHIELD | Source: Ambulatory Visit | Attending: Cardiology | Admitting: Cardiology

## 2016-06-24 DIAGNOSIS — I5023 Acute on chronic systolic (congestive) heart failure: Secondary | ICD-10-CM

## 2016-06-24 DIAGNOSIS — I5022 Chronic systolic (congestive) heart failure: Secondary | ICD-10-CM | POA: Insufficient documentation

## 2016-06-24 LAB — BASIC METABOLIC PANEL
Anion gap: 12 (ref 5–15)
BUN: 17 mg/dL (ref 6–20)
CALCIUM: 9.3 mg/dL (ref 8.9–10.3)
CO2: 22 mmol/L (ref 22–32)
Chloride: 103 mmol/L (ref 101–111)
Creatinine, Ser: 1.32 mg/dL — ABNORMAL HIGH (ref 0.61–1.24)
GFR, EST NON AFRICAN AMERICAN: 59 mL/min — AB (ref >60)
GLUCOSE: 194 mg/dL — AB (ref 65–99)
POTASSIUM: 4.3 mmol/L (ref 3.5–5.1)
Sodium: 137 mmol/L (ref 135–145)

## 2016-06-29 ENCOUNTER — Telehealth (HOSPITAL_COMMUNITY): Payer: Self-pay

## 2016-06-29 NOTE — Telephone Encounter (Signed)
Patient called CHF clinic triage to report patient experienced palpitations frequently since starting spironolactone 3 weeks ago.  Patient explained as feeling one extra beat every hour or so.  No dizziness, SOB, or any other contributing s/s associated with these episodes.  Weight stable, no swelling/edema noted per patient report.  States he stopped spironolactone 3 days ago and palpitations have stopped. Explained this is not a usual side effect, and potassium level was stable on the 6th, however, patient adamantly refuses to start medication back as he feels thi sis not a good option for him. Will forward to Dr. Gala Romney who prescribe medication to see what other alternatives and/or options are available to patient.  Ave Filter, RN

## 2016-07-07 ENCOUNTER — Ambulatory Visit (INDEPENDENT_AMBULATORY_CARE_PROVIDER_SITE_OTHER): Payer: BLUE CROSS/BLUE SHIELD | Admitting: *Deleted

## 2016-07-07 DIAGNOSIS — Z7901 Long term (current) use of anticoagulants: Secondary | ICD-10-CM | POA: Diagnosis not present

## 2016-07-07 DIAGNOSIS — I4891 Unspecified atrial fibrillation: Secondary | ICD-10-CM

## 2016-07-07 LAB — POCT INR: INR: 1.8

## 2016-07-12 ENCOUNTER — Other Ambulatory Visit (HOSPITAL_COMMUNITY): Payer: Self-pay | Admitting: Adult Health

## 2016-07-14 ENCOUNTER — Telehealth (HOSPITAL_COMMUNITY): Payer: Self-pay | Admitting: *Deleted

## 2016-07-14 NOTE — Telephone Encounter (Signed)
Pt called to ask about dental cleaning, he is not sure if he needs antibiotics or to hold his coumadin.  Advised no antibiotics needed and no need to hold coumadin, he is aware and agreeable.

## 2016-08-04 ENCOUNTER — Ambulatory Visit (INDEPENDENT_AMBULATORY_CARE_PROVIDER_SITE_OTHER): Payer: BLUE CROSS/BLUE SHIELD | Admitting: *Deleted

## 2016-08-04 DIAGNOSIS — I480 Paroxysmal atrial fibrillation: Secondary | ICD-10-CM | POA: Diagnosis not present

## 2016-08-04 LAB — POCT INR: INR: 2.1

## 2016-09-01 ENCOUNTER — Ambulatory Visit (INDEPENDENT_AMBULATORY_CARE_PROVIDER_SITE_OTHER): Payer: BLUE CROSS/BLUE SHIELD | Admitting: *Deleted

## 2016-09-01 DIAGNOSIS — Z7901 Long term (current) use of anticoagulants: Secondary | ICD-10-CM | POA: Diagnosis not present

## 2016-09-01 DIAGNOSIS — I4891 Unspecified atrial fibrillation: Secondary | ICD-10-CM | POA: Diagnosis not present

## 2016-09-01 LAB — POCT INR: INR: 1.9

## 2016-09-13 ENCOUNTER — Other Ambulatory Visit (HOSPITAL_COMMUNITY): Payer: Self-pay | Admitting: Student

## 2016-09-13 ENCOUNTER — Other Ambulatory Visit (HOSPITAL_COMMUNITY): Payer: Self-pay | Admitting: Adult Health

## 2016-09-13 DIAGNOSIS — I5022 Chronic systolic (congestive) heart failure: Secondary | ICD-10-CM

## 2016-09-21 ENCOUNTER — Other Ambulatory Visit (HOSPITAL_COMMUNITY): Payer: Self-pay | Admitting: Adult Health

## 2016-09-29 ENCOUNTER — Ambulatory Visit (INDEPENDENT_AMBULATORY_CARE_PROVIDER_SITE_OTHER): Payer: BLUE CROSS/BLUE SHIELD | Admitting: *Deleted

## 2016-09-29 DIAGNOSIS — I4891 Unspecified atrial fibrillation: Secondary | ICD-10-CM | POA: Diagnosis not present

## 2016-09-29 DIAGNOSIS — Z7901 Long term (current) use of anticoagulants: Secondary | ICD-10-CM | POA: Diagnosis not present

## 2016-09-29 LAB — POCT INR: INR: 1.9

## 2016-10-12 ENCOUNTER — Ambulatory Visit (HOSPITAL_COMMUNITY)
Admission: RE | Admit: 2016-10-12 | Discharge: 2016-10-12 | Disposition: A | Discharge status: Home / Self Care | Payer: BLUE CROSS/BLUE SHIELD | Source: Ambulatory Visit | Attending: Cardiology | Admitting: Cardiology

## 2016-10-12 ENCOUNTER — Encounter (HOSPITAL_COMMUNITY): Payer: Self-pay

## 2016-10-12 ENCOUNTER — Ambulatory Visit (INDEPENDENT_AMBULATORY_CARE_PROVIDER_SITE_OTHER): Payer: BLUE CROSS/BLUE SHIELD

## 2016-10-12 ENCOUNTER — Ambulatory Visit (INDEPENDENT_AMBULATORY_CARE_PROVIDER_SITE_OTHER): Payer: BLUE CROSS/BLUE SHIELD | Admitting: *Deleted

## 2016-10-12 ENCOUNTER — Other Ambulatory Visit: Payer: Self-pay | Admitting: Family Medicine

## 2016-10-12 VITALS — BP 120/82 | HR 74 | Wt 241.2 lb

## 2016-10-12 DIAGNOSIS — I429 Cardiomyopathy, unspecified: Secondary | ICD-10-CM | POA: Diagnosis not present

## 2016-10-12 DIAGNOSIS — E669 Obesity, unspecified: Secondary | ICD-10-CM | POA: Diagnosis not present

## 2016-10-12 DIAGNOSIS — I5022 Chronic systolic (congestive) heart failure: Secondary | ICD-10-CM | POA: Diagnosis not present

## 2016-10-12 DIAGNOSIS — I472 Ventricular tachycardia: Secondary | ICD-10-CM | POA: Diagnosis not present

## 2016-10-12 DIAGNOSIS — I4891 Unspecified atrial fibrillation: Secondary | ICD-10-CM | POA: Diagnosis not present

## 2016-10-12 DIAGNOSIS — Z7901 Long term (current) use of anticoagulants: Secondary | ICD-10-CM

## 2016-10-12 DIAGNOSIS — Z8249 Family history of ischemic heart disease and other diseases of the circulatory system: Secondary | ICD-10-CM | POA: Diagnosis not present

## 2016-10-12 DIAGNOSIS — I2510 Atherosclerotic heart disease of native coronary artery without angina pectoris: Secondary | ICD-10-CM | POA: Diagnosis not present

## 2016-10-12 DIAGNOSIS — Z87891 Personal history of nicotine dependence: Secondary | ICD-10-CM | POA: Diagnosis not present

## 2016-10-12 DIAGNOSIS — K439 Ventral hernia without obstruction or gangrene: Secondary | ICD-10-CM | POA: Insufficient documentation

## 2016-10-12 DIAGNOSIS — Z79899 Other long term (current) drug therapy: Secondary | ICD-10-CM | POA: Insufficient documentation

## 2016-10-12 DIAGNOSIS — N5089 Other specified disorders of the male genital organs: Secondary | ICD-10-CM

## 2016-10-12 LAB — POCT INR: INR: 3.5

## 2016-10-12 LAB — BASIC METABOLIC PANEL
Anion gap: 8 (ref 5–15)
BUN: 14 mg/dL (ref 6–20)
CHLORIDE: 104 mmol/L (ref 101–111)
CO2: 25 mmol/L (ref 22–32)
CREATININE: 1.15 mg/dL (ref 0.61–1.24)
Calcium: 9.2 mg/dL (ref 8.9–10.3)
GFR calc Af Amer: >60 mL/min (ref >60)
GFR calc non Af Amer: >60 mL/min (ref >60)
Glucose, Bld: 124 mg/dL — ABNORMAL HIGH (ref 65–99)
Potassium: 4.4 mmol/L (ref 3.5–5.1)
SODIUM: 137 mmol/L (ref 135–145)

## 2016-10-12 LAB — BRAIN NATRIURETIC PEPTIDE: B Natriuretic Peptide: 33.1 pg/mL (ref 0.0–100.0)

## 2016-10-12 NOTE — Patient Instructions (Signed)
Labs today (will call for abnormal results, otherwise no news is good news)  Echo and 30-Day event monitor has been ordered for you.  Follow up in 6 weeks.

## 2016-10-12 NOTE — Progress Notes (Signed)
Advanced Heart Failure Clinic Note   Patient ID: [name], male   DOB: [birth year], 56 y.o.   MRN: [id number]   PCP: Dr. Orvan July Primary HF Cardiologist: Dr Gala Romney  HPI: [name] is a 56 year old with no known cardiac history admitted 06/25/15 with progressive dyspnea on exertion. An ECHO was performed and showed severly depressed EF. He was diuresed with IV lasix and once optimized he was taken to the cath lab for RHC/ LHC. This showed minimal obstructive CAD, severe NICM, elevated filling pressures, and cardiogenic shock. At that point he was started on milrinone but later weaned off as he improved with adequate mixed venous saturation. cMRI performed and showed-NICM, EF 19%, mod [name], normal RV, and no infiltrative process. Also had A fib RVR but chemically converted with amio 200 mg twice daily. Started on coumadin. (Refused DOAC)  He returns today for regular follow up.  Back at work part time.  Seeing Dr Modesto Charon now. Has an abdominal hernia that needs surgical reduction. Breathing has been good.  Denies DOE, PND, or orthopnea.  Not exercising currently with the hernia pain.  Stands on his feet for 5-8 hours at work.  Denies lightheadedness. Having some vertiginous symptoms with Eustachian tube dysfunction with sinus problems. Wants to stop warfarin.  ECHO 7/17 EF 30%  Seems to be 35-40% on Definity images  ECHO 1/17  EF 25-30% RV ok  Labs 07/14/15: K 5.3 Creatinine 1.98 Spiro stopped.  Labs 08/28/15: K 4.1 Creatinine 1.2  ROS: All systems negative except as listed in HPI, PMH and Problem List.  SH:  Social History   Social History  . Marital status: Divorced    Spouse name: N/A  . Number of children: N/A  . Years of education: N/A   Occupational History  . Not on file.   Social History Main Topics  . Smoking status: Former Smoker    Types: Cigarettes    Quit date: 06/25/2015  . Smokeless tobacco: Former Neurosurgeon  . Alcohol use 0.0 oz/week     Comment: weekly    . Drug use: Unknown  . Sexual activity: Not on file   Other Topics Concern  . Not on file   Social History Narrative  . No narrative on file    FH:  Family History  Problem Relation Age of Onset  . Hypertension Mother     Past Medical History:  Diagnosis Date  . Diverticulitis    ACTIVE WITH ABCESS    Current Outpatient Prescriptions  Medication Sig Dispense Refill  . amiodarone (PACERONE) 200 MG tablet Take 0.5 tablets (100 mg total) by mouth daily.    Marland Kitchen ENTRESTO 97-103 MG TAKE ONE TABLET BY MOUTH TWICE DAILY 60 tablet 6  . fluticasone (FLONASE) 50 MCG/ACT nasal spray Place 1 spray into both nostrils daily.    . furosemide (LASIX) 40 MG tablet TAKE ONE TABLET BY MOUTH ONCE DAILY 30 tablet 3  . metoprolol succinate (TOPROL-XL) 50 MG 24 hr tablet Take 1 tablet once daily 30 tablet 6  . Multiple Vitamins-Minerals (MULTIVITAL PO) Take by mouth daily. MENS MEGA MULTIVIT.     Marland Kitchen Probiotic Product (PROBIOTIC DAILY PO) Take 1 capsule by mouth daily.    Marland Kitchen warfarin (COUMADIN) 5 MG tablet TAKE ONE TABLET BY MOUTH ONCE DAILY 45 tablet 6   No current facility-administered medications for this encounter.     Vitals:   10/12/16 0909  BP: 120/82  Pulse: 74  SpO2: 95%  Weight: 241  lb 3.2 oz (109.4 kg)   Wt Readings from Last 3 Encounters:  10/12/16 241 lb 3.2 oz (109.4 kg)  06/14/16 240 lb 8 oz (109.1 kg)  04/13/16 231 lb (104.8 kg)    PHYSICAL EXAM: General:  Obese. NAD.   HEENT: [place] Neck: supple. JVP 6-7 cm. . Carotids 2+ bilaterally; no bruits. No thyromegaly or nodule noted. Cor: PMI normal. Regular. No rubs, gallops or murmurs. Lungs: CTAB, normal effort Abdomen: obese soft, NT, ND, no HSM. No bruits or masses. +BS  Large ventral hernia in RLQ Extremities: no cyanosis, clubbing, rash, edema Neuro: alert & orientedx3, cranial nerves grossly intact. Moves all 4 extremities w/o difficulty. Affect pleasant.   ASSESSMENT & PLAN: 1. Chronic Systolic HF. LVEF 15-20% ->  cardiogenic shock --06/2015 CMRI- NICM EF 19% mod [name]. RV normal. No infiltrative process.  - Echo 7/17 EF 30% most recently.  --NYHA I. Volume status stable exam.  Continue lasix 40 mg daily  --Intolerant carvedilol at the lowest dose due to feeling of doom.  --On toprol 50mg  qhs failed titration to 75mg  several times --Continue goal-dose Entresto 97-103 bid.   --Continue spiro 12.5. BMET today.  --EF had improved at last check. Wants to hold off on ICD for now.  - Needs Repeat echo for surgical consideration.  2. NSVT- on amio 100 daily. Continue coumadin for now.  3. Morbid obesity - Needs to resume exercise but currently limited by hernia.  4. 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 A fib RVR 07/02/2015 ---CHADS Vasc Score 2. Maintaining NSR on amio 100 mg/d. On coumadin.  - Again wants off coumadin. Had not had recurrence that has been cataloged by EKG. - Will place 30-day event monitor. Didn't hear back from scheduling last time. - Can consider stopping coumadin if no AF.   6. Former smoker - quit 06/25/2015.  7. Returned to work - On Long term disability.  Needs to go back to full time to keep his medical coverage.   8. Large Ventral Hernia - Dr. Modesto Charon and Dr. Dwain Sarna following for possible surgery - Needs Echo and event monitor as above for cardiac clearance.  Will repeat echo and place 30 day event monitor.  Follow up 4-6 weeks with Dr. Gala Romney after event monitor completed.   Graciella Freer, PA-C  9:24 AM  Total time spent > 25 minutes. Over half that spent discussing the above.

## 2016-10-13 ENCOUNTER — Ambulatory Visit
Admission: RE | Admit: 2016-10-13 | Discharge: 2016-10-13 | Disposition: A | Discharge status: Home / Self Care | Payer: BLUE CROSS/BLUE SHIELD | Source: Ambulatory Visit | Attending: Family Medicine | Admitting: Family Medicine

## 2016-10-13 DIAGNOSIS — N5089 Other specified disorders of the male genital organs: Secondary | ICD-10-CM

## 2016-10-18 ENCOUNTER — Ambulatory Visit (HOSPITAL_COMMUNITY)
Admission: RE | Admit: 2016-10-18 | Discharge: 2016-10-18 | Disposition: A | Discharge status: Home / Self Care | Payer: BLUE CROSS/BLUE SHIELD | Source: Ambulatory Visit | Attending: Family Medicine | Admitting: Family Medicine

## 2016-10-18 DIAGNOSIS — I5022 Chronic systolic (congestive) heart failure: Secondary | ICD-10-CM | POA: Diagnosis not present

## 2016-10-18 DIAGNOSIS — I517 Cardiomegaly: Secondary | ICD-10-CM | POA: Insufficient documentation

## 2016-10-18 DIAGNOSIS — I509 Heart failure, unspecified: Secondary | ICD-10-CM | POA: Diagnosis present

## 2016-10-18 DIAGNOSIS — I351 Nonrheumatic aortic (valve) insufficiency: Secondary | ICD-10-CM | POA: Diagnosis not present

## 2016-10-18 LAB — ECHOCARDIOGRAM COMPLETE
AVLVOTPG: 2 mmHg
CHL CUP DOP CALC LVOT VTI: 15.8 cm
CHL CUP MV DEC (S): 222
E decel time: 222 msec
EERAT: 7.73
FS: 26 % — AB (ref 28–44)
IV/PV OW: 0.8
LA vol: 60.9 mL
LASIZE: 37 mm
LAVOLA4C: 50.7 mL
LEFT ATRIUM END SYS DIAM: 37 mm
LV E/e'average: 7.73
LVEEMED: 7.73
LVELAT: 9.03 cm/s
LVOT SV: 66 mL
LVOT area: 4.15 cm2
LVOT diameter: 23 mm
LVOT peak vel: 72.4 cm/s
Lateral S' vel: 13.2 cm/s
MV pk A vel: 96 m/s
MVPKEVEL: 69.8 m/s
PW: 12.6 mm — AB (ref 0.6–1.1)
TAPSE: 24.9 mm
TDI e' lateral: 9.03
TDI e' medial: 7.62

## 2016-10-18 NOTE — Progress Notes (Signed)
  Echocardiogram 2D Echocardiogram has been performed.  Delcie Roch 10/18/2016, 8:55 AM

## 2016-10-25 ENCOUNTER — Other Ambulatory Visit: Payer: Self-pay | Admitting: Surgery

## 2016-10-26 ENCOUNTER — Ambulatory Visit (INDEPENDENT_AMBULATORY_CARE_PROVIDER_SITE_OTHER): Payer: BLUE CROSS/BLUE SHIELD | Admitting: *Deleted

## 2016-10-26 DIAGNOSIS — Z7901 Long term (current) use of anticoagulants: Secondary | ICD-10-CM

## 2016-10-26 DIAGNOSIS — I4891 Unspecified atrial fibrillation: Secondary | ICD-10-CM | POA: Diagnosis not present

## 2016-10-26 LAB — POCT INR: INR: 2.1

## 2016-11-01 ENCOUNTER — Telehealth (HOSPITAL_COMMUNITY): Payer: Self-pay | Admitting: *Deleted

## 2016-11-01 NOTE — Telephone Encounter (Signed)
Requested Disability forms completed and faxed to [facility] for patient today @ [phone].

## 2016-11-14 ENCOUNTER — Ambulatory Visit (HOSPITAL_COMMUNITY)
Admission: RE | Admit: 2016-11-14 | Discharge: 2016-11-14 | Disposition: A | Discharge status: Home / Self Care | Payer: BLUE CROSS/BLUE SHIELD | Source: Ambulatory Visit | Attending: Internal Medicine | Admitting: Internal Medicine

## 2016-11-14 ENCOUNTER — Encounter (HOSPITAL_COMMUNITY): Payer: Self-pay | Admitting: Internal Medicine

## 2016-11-14 ENCOUNTER — Ambulatory Visit (INDEPENDENT_AMBULATORY_CARE_PROVIDER_SITE_OTHER): Payer: BLUE CROSS/BLUE SHIELD | Admitting: *Deleted

## 2016-11-14 VITALS — BP 120/68 | HR 82 | Wt 233.8 lb

## 2016-11-14 DIAGNOSIS — I10 Essential (primary) hypertension: Secondary | ICD-10-CM | POA: Diagnosis not present

## 2016-11-14 DIAGNOSIS — I472 Ventricular tachycardia: Secondary | ICD-10-CM | POA: Diagnosis not present

## 2016-11-14 DIAGNOSIS — Z79899 Other long term (current) drug therapy: Secondary | ICD-10-CM | POA: Diagnosis not present

## 2016-11-14 DIAGNOSIS — Z7901 Long term (current) use of anticoagulants: Secondary | ICD-10-CM | POA: Diagnosis not present

## 2016-11-14 DIAGNOSIS — Z87891 Personal history of nicotine dependence: Secondary | ICD-10-CM | POA: Insufficient documentation

## 2016-11-14 DIAGNOSIS — I428 Other cardiomyopathies: Secondary | ICD-10-CM | POA: Diagnosis not present

## 2016-11-14 DIAGNOSIS — I4891 Unspecified atrial fibrillation: Secondary | ICD-10-CM | POA: Diagnosis not present

## 2016-11-14 DIAGNOSIS — I5022 Chronic systolic (congestive) heart failure: Secondary | ICD-10-CM | POA: Diagnosis present

## 2016-11-14 DIAGNOSIS — I2510 Atherosclerotic heart disease of native coronary artery without angina pectoris: Secondary | ICD-10-CM | POA: Insufficient documentation

## 2016-11-14 DIAGNOSIS — I480 Paroxysmal atrial fibrillation: Secondary | ICD-10-CM | POA: Diagnosis not present

## 2016-11-14 DIAGNOSIS — K439 Ventral hernia without obstruction or gangrene: Secondary | ICD-10-CM | POA: Diagnosis not present

## 2016-11-14 LAB — POCT INR: INR: 2.4

## 2016-11-14 NOTE — Addendum Note (Signed)
Encounter addended by: Noralee Space, RN on: 11/14/2016 10:06 AM<BR>    Actions taken: Sign clinical note

## 2016-11-14 NOTE — Progress Notes (Signed)
Advanced Heart Failure Clinic Note   Patient ID: DAILYN KEMPNER, male   DOB: [birth year], 56 y.o.   MRN: [id number]   PCP: Dr. Orvan July Primary HF Cardiologist: Dr Gala Romney  HPI: Mr Blissett is a 56 year old with no known cardiac history admitted 06/25/15 with progressive dyspnea on exertion. An ECHO was performed and showed severly depressed EF. He was diuresed with IV lasix and once optimized he was taken to the cath lab for RHC/ LHC. This showed minimal obstructive CAD, severe NICM, elevated filling pressures, and cardiogenic shock. At that point he was started on milrinone but later weaned off as he improved with adequate mixed venous saturation. cMRI performed and showed-NICM, EF 19%, mod MR, normal RV, and no infiltrative process. Also had A fib RVR but chemically converted with amio 200 mg twice daily. Started on coumadin. (Refused DOAC)  He returns today for regular follow up.  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  Walk about a mile a day as well.  Saw Dr. Dwain Sarna about abdominal hernia and said he has to lose 20-30 pounds before he will operate. Denies dyspnea, orthopnea, PND.  Has lost 7-8 pounds over past month. No problems with meds. Wants to come off amio. Has worn 30-day monitor but results not back yet   ECHO 7/17 EF 30%  Seems to be 35-40% on Definity images  ECHO 1/17  EF 25-30% RV ok Echo 1/18 EF 40-45%  Labs 07/14/15: K 5.3 Creatinine 1.98 Spiro stopped.  Labs 08/28/15: K 4.1 Creatinine 1.2  ROS: All systems negative except as listed in HPI, PMH and Problem List.  SH:  Social History   Social History  . Marital status: Divorced    Spouse name: N/A  . Number of children: N/A  . Years of education: N/A   Occupational History  . Not on file.   Social History Main Topics  . Smoking status: Former Smoker    Types: Cigarettes    Quit date: 06/25/2015  . Smokeless tobacco: Former Neurosurgeon  . Alcohol use 0.0 oz/week     Comment: weekly  .  Drug use: Unknown  . Sexual activity: Not on file   Other Topics Concern  . Not on file   Social History Narrative  . No narrative on file    FH:  Family History  Problem Relation Age of Onset  . Hypertension Mother     Past Medical History:  Diagnosis Date  . Diverticulitis    ACTIVE WITH ABCESS    Current Outpatient Prescriptions  Medication Sig Dispense Refill  . amiodarone (PACERONE) 200 MG tablet Take 0.5 tablets (100 mg total) by mouth daily.    Marland Kitchen ENTRESTO 97-103 MG TAKE ONE TABLET BY MOUTH TWICE DAILY 60 tablet 6  . fluticasone (FLONASE) 50 MCG/ACT nasal spray Place 1 spray into both nostrils daily.    . furosemide (LASIX) 40 MG tablet TAKE ONE TABLET BY MOUTH ONCE DAILY 30 tablet 3  . metoprolol succinate (TOPROL-XL) 50 MG 24 hr tablet Take 1 tablet once daily 30 tablet 6  . Multiple Vitamins-Minerals (MULTIVITAL PO) Take by mouth daily. MENS MEGA MULTIVIT.     Marland Kitchen Probiotic Product (PROBIOTIC DAILY PO) Take 1 capsule by mouth daily.    Marland Kitchen warfarin (COUMADIN) 5 MG tablet TAKE ONE TABLET BY MOUTH ONCE DAILY 45 tablet 6   No current facility-administered medications for this encounter.     Vitals:   11/14/16 0943  BP: 120/68  Pulse: 82  SpO2: 98%  Weight: 233 lb 12 oz (106 kg)   Wt Readings from Last 3 Encounters:  11/14/16 233 lb 12 oz (106 kg)  10/12/16 241 lb 3.2 oz (109.4 kg)  06/14/16 240 lb 8 oz (109.1 kg)    PHYSICAL EXAM: General:  Obese. NAD.   HEENT: Normal Neck: supple. JVP 6 cm. . Carotids 2+ bilaterally; no bruits. No thyromegaly or nodule noted. Cor: PMI normal. Regular. No rubs, gallops or murmurs. Lungs: CTAB, normal effort Abdomen: obese soft, NT, ND, no HSM. No bruits or masses. +BS  Large ventral hernia. Wearing brace Extremities: no cyanosis, clubbing, rash, edema Neuro: alert & orientedx3, cranial nerves grossly intact. Moves all 4 extremities w/o difficulty. Affect pleasant.   ASSESSMENT & PLAN: 1. Chronic Systolic HF. LVEF 15-20%  -> cardiogenic shock. Cath with minimal non-obstructive CAD --06/2015 CMRI- NICM EF 19% mod MR. RV normal. No infiltrative process.  - Echo 1/18 EF 40-45% --NYHA I. Volume status stable exam.  Continue lasix 40 mg daily  --Intolerant carvedilol at the lowest dose due to feeling of doom.  --On toprol 50mg  qhs failed titration to 75mg  several times --Continue goal-dose Entresto 97-103 bid.   --Continue spiro 12.5. BMET today.  --EF had improved. Does not qualify for ICD  2. NSVT- on amio 100 daily. Continue coumadin for now.  3. Morbid obesity - Needs to resume exercise but currently limited by hernia.  4. 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 Off ASA with warfarin. 5. A fib RVR 07/02/2015 ---CHADS Vasc Score 2. Maintaining NSR on amio 100 mg/d. On coumadin.  - Again wants to stop coumadin and amio.  - Await results of 30-day event monitor.  - Can consider stopping coumadin if no AF.   6. Former smoker - quit 06/25/2015.  7. Large Ventral Hernia - Dr. Dwain Sarna following for possible surgery - Given improved LVEF would be ok to proceed with hernia repair from a CV standpoint with only mild to moderate risk of peri-op CV complications  Arvilla Meres, MD  9:54 AM

## 2016-11-14 NOTE — Patient Instructions (Signed)
We will contact you in 6 months to schedule your next appointment.  

## 2016-11-22 ENCOUNTER — Telehealth (HOSPITAL_COMMUNITY): Payer: Self-pay

## 2016-11-22 NOTE — Telephone Encounter (Signed)
Patient stopped by clinic to ask if results of his heart monitor were read. Dr. Gala Romney read as NSR, no afib detected. Patient asking if he can come off of amiodarone and coumadin. Will forward to Dr. Gala Romney to advise.  Ave Filter, RN

## 2016-11-26 NOTE — Telephone Encounter (Signed)
I have suggested he continue at this point due to risk of recurrence.

## 2016-11-28 NOTE — Telephone Encounter (Signed)
Left VM for patient to return call to advise.  Ave Filter, RN

## 2016-11-29 ENCOUNTER — Telehealth (HOSPITAL_COMMUNITY): Payer: Self-pay | Admitting: *Deleted

## 2016-11-29 NOTE — Telephone Encounter (Signed)
Pt called to discuss his Amio and Coumadin, he thought once he did 30 day monitor if no a-fib he could stop meds.  Discussed w/Dr Bensimhon, monitor w/no a-fib, NSR he recommends w/pt's hx of a-fib he continue meds, if pt stops coumadin he has a 5% risk of stroke.  Pt is aware and agreeable.  He states he is going to stop Amio for a month or so and see how he does, if no problems he may stop coumadin.  Advised of Dr Elijah Birk recommendations again and risk of stroke, he is aware.

## 2016-12-09 ENCOUNTER — Other Ambulatory Visit (HOSPITAL_COMMUNITY): Payer: Self-pay | Admitting: Cardiology

## 2016-12-12 ENCOUNTER — Ambulatory Visit (INDEPENDENT_AMBULATORY_CARE_PROVIDER_SITE_OTHER): Payer: BLUE CROSS/BLUE SHIELD | Admitting: *Deleted

## 2016-12-12 DIAGNOSIS — I4891 Unspecified atrial fibrillation: Secondary | ICD-10-CM

## 2016-12-12 DIAGNOSIS — Z7901 Long term (current) use of anticoagulants: Secondary | ICD-10-CM

## 2016-12-12 LAB — POCT INR: INR: 3.1

## 2017-01-09 ENCOUNTER — Ambulatory Visit (INDEPENDENT_AMBULATORY_CARE_PROVIDER_SITE_OTHER): Payer: BLUE CROSS/BLUE SHIELD | Admitting: *Deleted

## 2017-01-09 DIAGNOSIS — Z7901 Long term (current) use of anticoagulants: Secondary | ICD-10-CM

## 2017-01-09 DIAGNOSIS — I4891 Unspecified atrial fibrillation: Secondary | ICD-10-CM

## 2017-01-09 LAB — POCT INR: INR: 3.3

## 2017-01-18 ENCOUNTER — Other Ambulatory Visit (HOSPITAL_COMMUNITY): Payer: Self-pay | Admitting: Adult Health

## 2017-01-23 ENCOUNTER — Ambulatory Visit (INDEPENDENT_AMBULATORY_CARE_PROVIDER_SITE_OTHER): Payer: BLUE CROSS/BLUE SHIELD

## 2017-01-23 DIAGNOSIS — I4891 Unspecified atrial fibrillation: Secondary | ICD-10-CM | POA: Diagnosis not present

## 2017-01-23 DIAGNOSIS — Z7901 Long term (current) use of anticoagulants: Secondary | ICD-10-CM | POA: Diagnosis not present

## 2017-01-23 LAB — POCT INR: INR: 2

## 2017-02-15 ENCOUNTER — Ambulatory Visit (INDEPENDENT_AMBULATORY_CARE_PROVIDER_SITE_OTHER): Payer: BLUE CROSS/BLUE SHIELD | Admitting: *Deleted

## 2017-02-15 DIAGNOSIS — Z7901 Long term (current) use of anticoagulants: Secondary | ICD-10-CM | POA: Diagnosis not present

## 2017-02-15 DIAGNOSIS — I4891 Unspecified atrial fibrillation: Secondary | ICD-10-CM | POA: Diagnosis not present

## 2017-02-15 LAB — POCT INR: INR: 1.9

## 2017-03-01 ENCOUNTER — Ambulatory Visit (INDEPENDENT_AMBULATORY_CARE_PROVIDER_SITE_OTHER): Payer: BLUE CROSS/BLUE SHIELD | Admitting: *Deleted

## 2017-03-01 ENCOUNTER — Telehealth (HOSPITAL_COMMUNITY): Payer: Self-pay | Admitting: Vascular Surgery

## 2017-03-01 DIAGNOSIS — Z7901 Long term (current) use of anticoagulants: Secondary | ICD-10-CM | POA: Diagnosis not present

## 2017-03-01 DIAGNOSIS — I4891 Unspecified atrial fibrillation: Secondary | ICD-10-CM

## 2017-03-01 LAB — POCT INR: INR: 3.2

## 2017-03-01 NOTE — Telephone Encounter (Signed)
Pt walked in he wants to talk to DB about coming off his blood thinner.. Please advise

## 2017-03-01 NOTE — Telephone Encounter (Signed)
Spoke w/he is insistent that he stops his coumadin, per previous notes Dr Gala Romney has advised:  if pt stops coumadin he has a 5% risk of stroke.  Pt continues to feel his a-fib was caused by the nurse push/pulling with large saline flush on his picc line.  And he wants to be off coumadin, he does not want and does not feel he needs any anticoag.  Explained again pt's risk for stroke, advised him I would discuss w/Dr Bensimhon again and call him back tomorrow.

## 2017-03-14 ENCOUNTER — Telehealth (HOSPITAL_COMMUNITY): Payer: Self-pay

## 2017-03-14 NOTE — Telephone Encounter (Signed)
Patient left VM on CHF clinic triage line stating his recent OV at Nephrologist office resulted in DC of lasix due to "elevated kidney function test." Left return VM to return our call to give name of nephrologist so that we can request progress ntoe and lab results.  Ave Filter, RN

## 2017-03-15 ENCOUNTER — Ambulatory Visit (INDEPENDENT_AMBULATORY_CARE_PROVIDER_SITE_OTHER): Payer: BLUE CROSS/BLUE SHIELD | Admitting: *Deleted

## 2017-03-15 DIAGNOSIS — I4891 Unspecified atrial fibrillation: Secondary | ICD-10-CM

## 2017-03-15 DIAGNOSIS — Z7901 Long term (current) use of anticoagulants: Secondary | ICD-10-CM | POA: Diagnosis not present

## 2017-03-15 LAB — POCT INR: INR: 2.4

## 2017-03-17 NOTE — Telephone Encounter (Signed)
Per Dr Gala Romney pt needs to continue Coumadin due to a-fib, pt is aware and agreeable and states he will continue for now and will discuss w/Dr Bensimhon at his next appt in Aug.

## 2017-04-05 ENCOUNTER — Ambulatory Visit (INDEPENDENT_AMBULATORY_CARE_PROVIDER_SITE_OTHER): Payer: BLUE CROSS/BLUE SHIELD | Admitting: *Deleted

## 2017-04-05 DIAGNOSIS — I4891 Unspecified atrial fibrillation: Secondary | ICD-10-CM

## 2017-04-05 DIAGNOSIS — Z7901 Long term (current) use of anticoagulants: Secondary | ICD-10-CM

## 2017-04-05 LAB — POCT INR: INR: 2.4

## 2017-04-10 ENCOUNTER — Other Ambulatory Visit (HOSPITAL_COMMUNITY): Payer: Self-pay | Admitting: Student

## 2017-04-10 DIAGNOSIS — I5022 Chronic systolic (congestive) heart failure: Secondary | ICD-10-CM

## 2017-04-26 IMAGING — CT CT ANGIO CHEST
2 of 8 series · 18 of 46 positions shown · IV contrast (OMNI)
Comparison: None.

CLINICAL DATA: Short of breath for 2 months

EXAM:
CT ANGIOGRAPHY CHEST WITH CONTRAST
TECHNIQUE: Multidetector CT imaging of the chest was performed using the
standard protocol during bolus administration of intravenous
contrast. Multiplanar CT image reconstructions and MIPs were
obtained to evaluate the vascular anatomy.
CONTRAST:  100mL OMNIPAQUE IOHEXOL 350 MG/ML SOLN

[Series 5: thins · axial · 0.79mm/px · z∈[-464,-193]mm · 15 of 299 slices shown]
[im 14/299  lung]
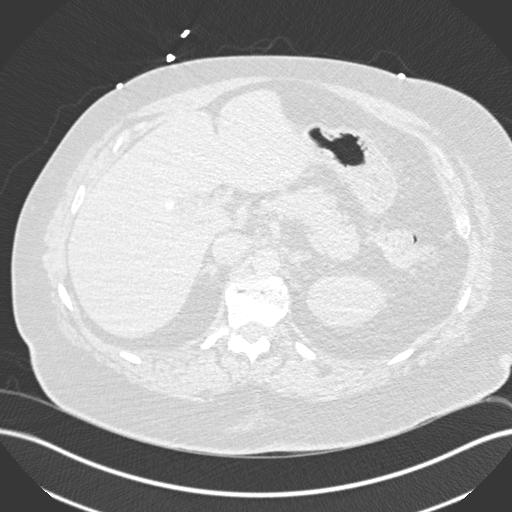
[im 41/299  soft-tissue]
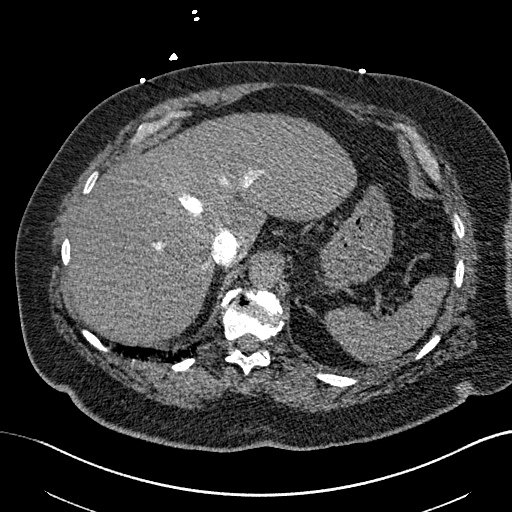
[im 55/299  lung]
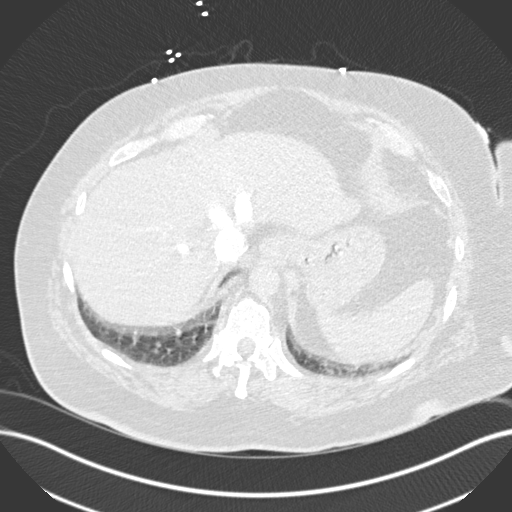
[im 68/299  soft-tissue]
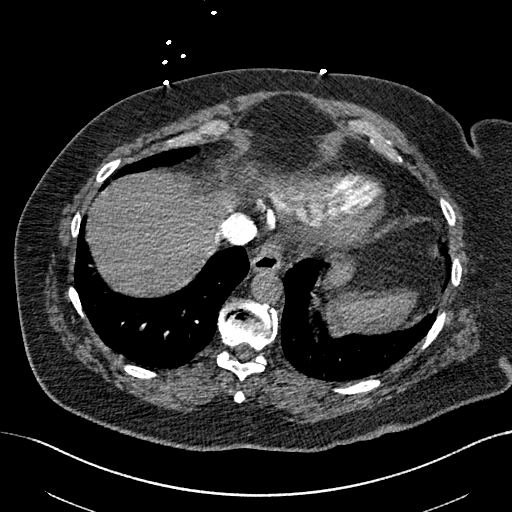
[im 95/299  lung]
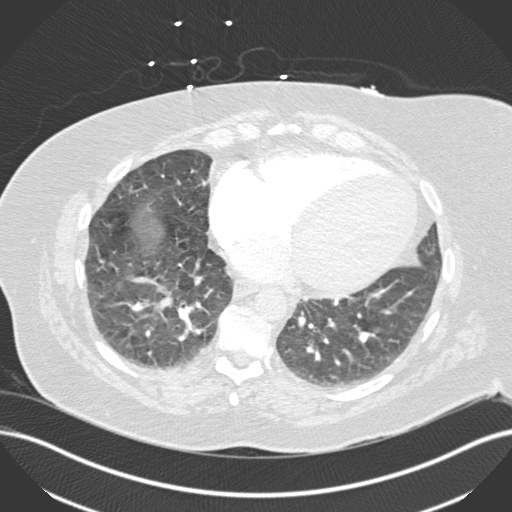
[im 109/299  soft-tissue]
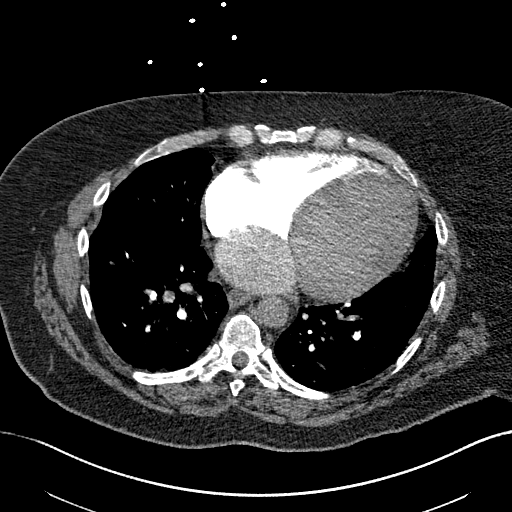
[im 136/299  lung]
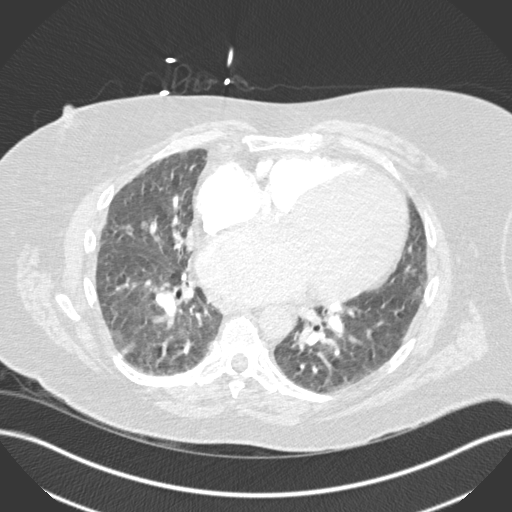
[im 150/299  soft-tissue]
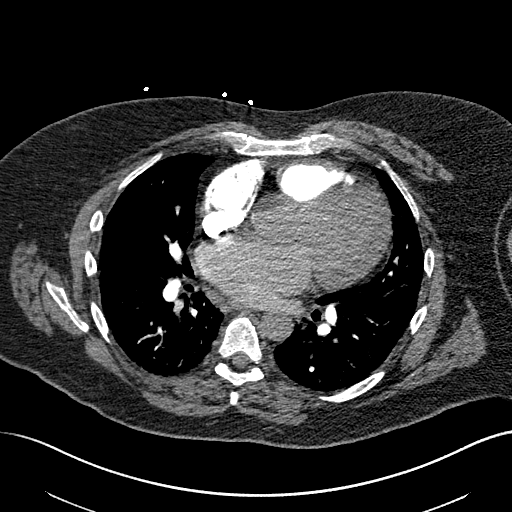
[im 163/299  lung]
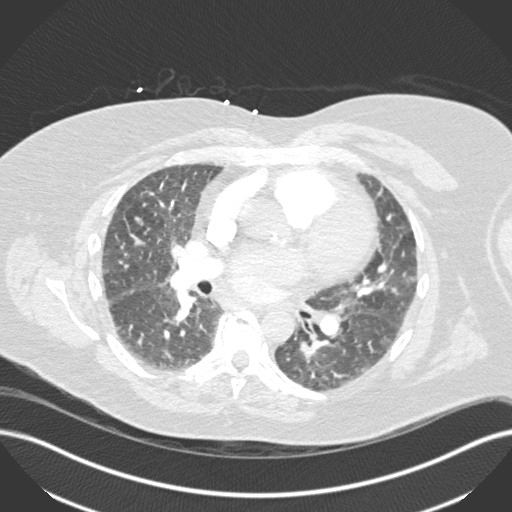
[im 190/299  soft-tissue]
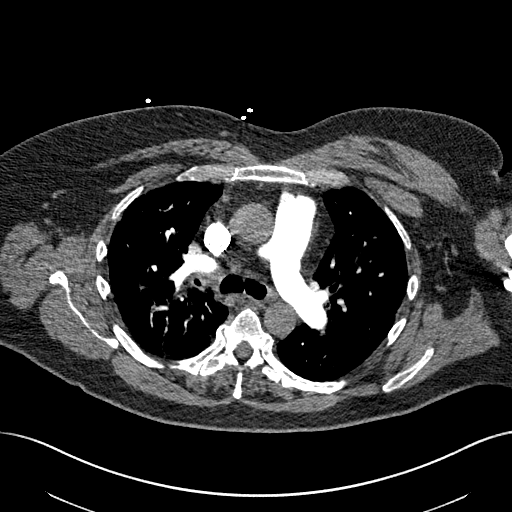
[im 204/299  lung]
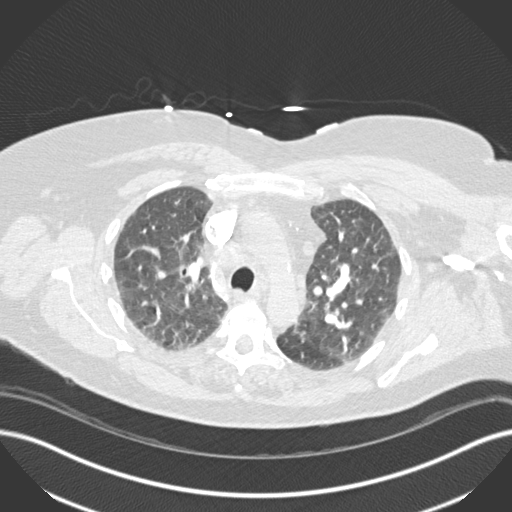
[im 231/299  soft-tissue]
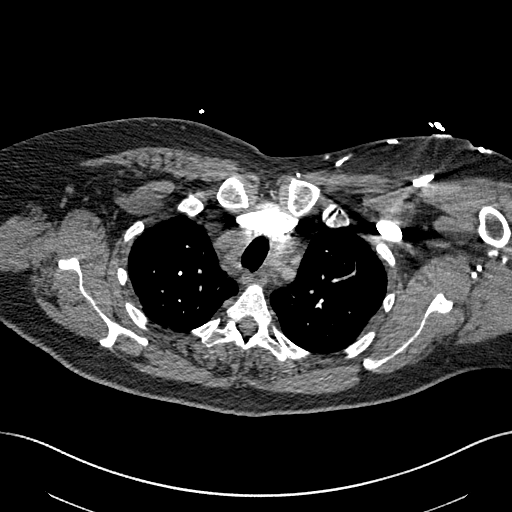
[im 244/299  lung]
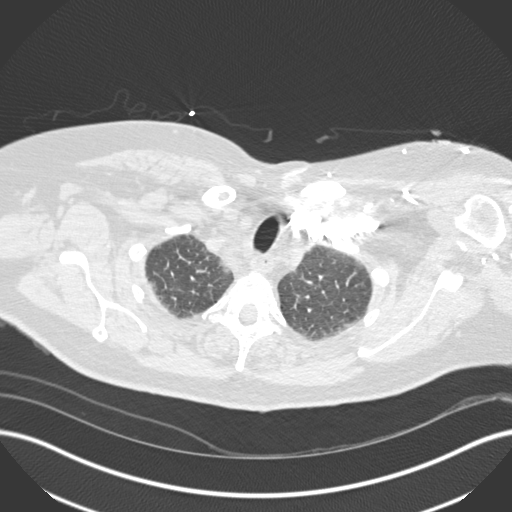
[im 258/299  soft-tissue]
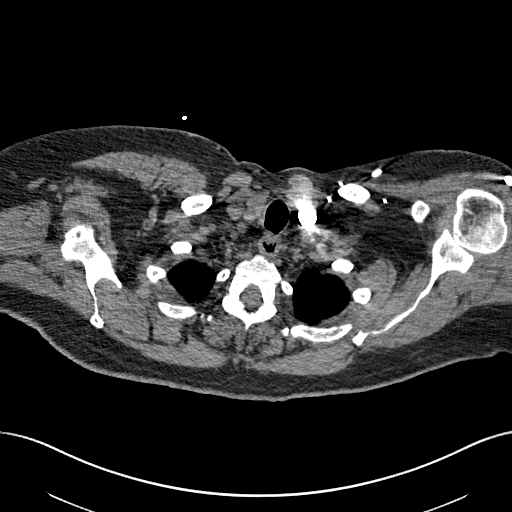
[im 285/299  lung]
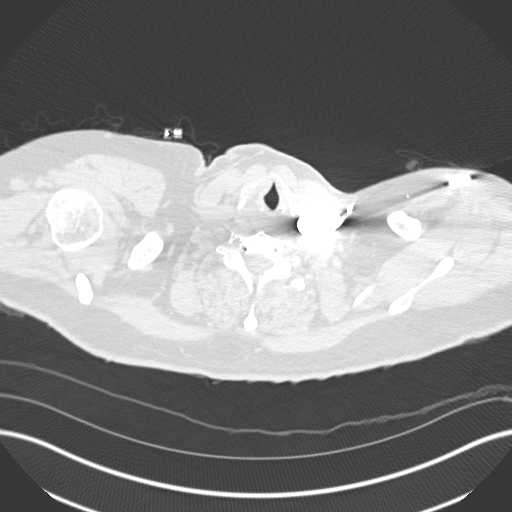

[Series 7: coronal mpr · coronal · 0.59mm/px · 3 of 128 slices shown]
[im 32/128  soft-tissue]
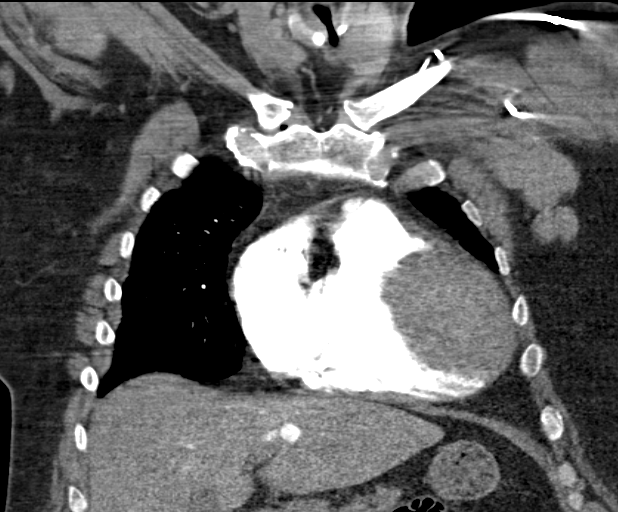
[im 64/128  soft-tissue]
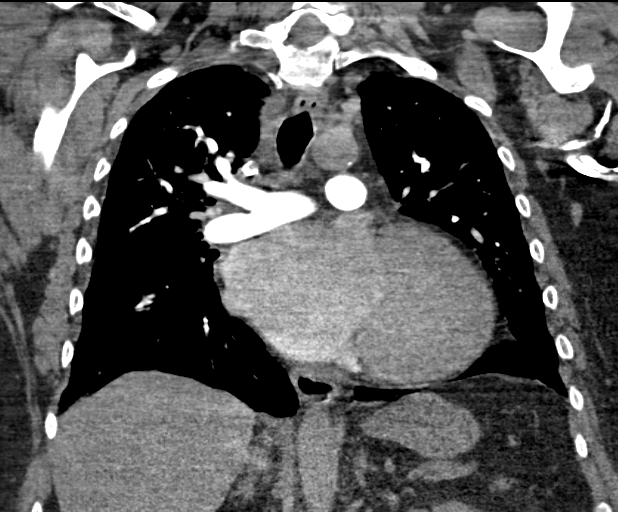
[im 96/128  soft-tissue]
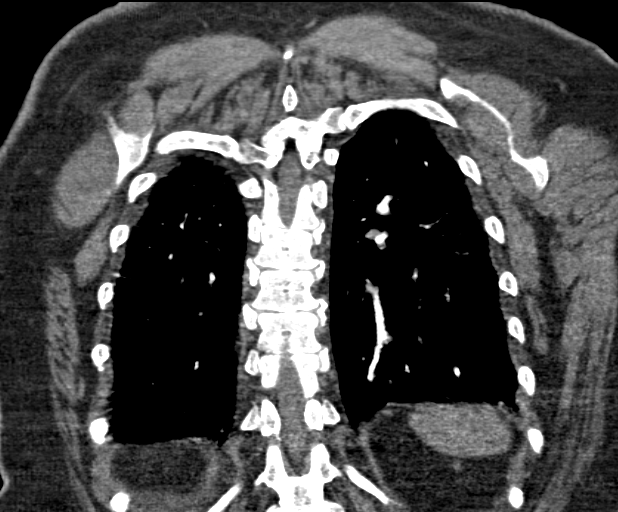

[18 of 46 positions shown; findings below may reference images not displayed]

FINDINGS: There are no filling defects in the pulmonary arterial tree to
suggest acute pulmonary thromboembolism.

Left ventricular hypertrophy. There is some enlargement of the left
atrium.

Abnormal mediastinal adenopathy. 18 mm short axis diameter
precarinal node. 11 mm paratracheal node on image 29. Other
scattered smaller mediastinal nodes.

No pneumothorax.  No pleural effusion.

Clear lungs.

No vertebral compression deformity.

There is contrast reflux into the hepatic veins suggesting elevated
right heart pressure.

Review of the MIP images confirms the above findings.
IMPRESSION: No evidence of acute pulmonary thromboembolism

There is abnormal mediastinal adenopathy. A lymphoproliferative
disorder is not excluded. Correlate clinically as for the need for
further imaging or three-month follow-up CT.

## 2017-05-03 ENCOUNTER — Ambulatory Visit (INDEPENDENT_AMBULATORY_CARE_PROVIDER_SITE_OTHER): Payer: BLUE CROSS/BLUE SHIELD | Admitting: *Deleted

## 2017-05-03 DIAGNOSIS — Z7901 Long term (current) use of anticoagulants: Secondary | ICD-10-CM

## 2017-05-03 DIAGNOSIS — I4891 Unspecified atrial fibrillation: Secondary | ICD-10-CM | POA: Diagnosis not present

## 2017-05-03 LAB — POCT INR: INR: 2.7

## 2017-05-12 ENCOUNTER — Encounter (HOSPITAL_COMMUNITY): Payer: Self-pay | Admitting: Internal Medicine

## 2017-05-12 ENCOUNTER — Ambulatory Visit (HOSPITAL_COMMUNITY)
Admission: RE | Admit: 2017-05-12 | Discharge: 2017-05-12 | Disposition: A | Discharge status: Home / Self Care | Payer: BLUE CROSS/BLUE SHIELD | Source: Ambulatory Visit | Attending: Internal Medicine | Admitting: Internal Medicine

## 2017-05-12 VITALS — BP 143/72 | HR 71 | Wt 228.4 lb

## 2017-05-12 DIAGNOSIS — I5022 Chronic systolic (congestive) heart failure: Secondary | ICD-10-CM | POA: Diagnosis not present

## 2017-05-12 DIAGNOSIS — K439 Ventral hernia without obstruction or gangrene: Secondary | ICD-10-CM | POA: Diagnosis not present

## 2017-05-12 DIAGNOSIS — I110 Hypertensive heart disease with heart failure: Secondary | ICD-10-CM | POA: Diagnosis present

## 2017-05-12 DIAGNOSIS — Z79899 Other long term (current) drug therapy: Secondary | ICD-10-CM | POA: Diagnosis not present

## 2017-05-12 DIAGNOSIS — Z87891 Personal history of nicotine dependence: Secondary | ICD-10-CM | POA: Diagnosis not present

## 2017-05-12 DIAGNOSIS — Z7901 Long term (current) use of anticoagulants: Secondary | ICD-10-CM | POA: Insufficient documentation

## 2017-05-12 DIAGNOSIS — I472 Ventricular tachycardia: Secondary | ICD-10-CM | POA: Diagnosis not present

## 2017-05-12 DIAGNOSIS — I480 Paroxysmal atrial fibrillation: Secondary | ICD-10-CM | POA: Insufficient documentation

## 2017-05-12 DIAGNOSIS — I10 Essential (primary) hypertension: Secondary | ICD-10-CM

## 2017-05-12 DIAGNOSIS — I2510 Atherosclerotic heart disease of native coronary artery without angina pectoris: Secondary | ICD-10-CM | POA: Diagnosis not present

## 2017-05-12 NOTE — Patient Instructions (Signed)
STOP Coumadin  Echocardiogram and Follow up in 6 Months

## 2017-05-12 NOTE — Addendum Note (Signed)
Encounter addended by: Georgina Peer, RN on: 05/12/2017  9:25 AM<BR>    Actions taken: Medication long-term status modified, Order list changed, Diagnosis association updated, Sign clinical note

## 2017-05-12 NOTE — Progress Notes (Signed)
Advanced Heart Failure Clinic Note   Patient ID: [name], male   DOB: 18-Aug-1961, 56 y.o.   MRN: [id number]   PCP: Dr. Orvan July Primary HF Cardiologist: Dr Gala Romney  HPI: [name] is a 56 year old with no known cardiac history admitted 06/25/15 with progressive dyspnea on exertion. An ECHO was performed and showed severly depressed EF. He was diuresed with IV lasix and once optimized he was taken to the cath lab for RHC/ LHC. This showed minimal obstructive CAD, severe NICM, elevated filling pressures, and cardiogenic shock. At that point he was started on milrinone but later weaned off as he improved with adequate mixed venous saturation. cMRI performed and showed-NICM, EF 19%, mod [name], normal RV, and no infiltrative process. Also had A fib RVR but chemically converted with amio 200 mg twice daily. Started on coumadin. (Refused DOAC)  He returns today for regular follow up.  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  Saw Dr. Dwain Sarna about abdominal hernia and said he has to lose 20-30 pounds before he will operate. Has lost 13 pounds this years. Says he feels it is related to stopping amiodarone. Denies edema, orthopnea or PND. No exertional dyspnea.  Denies dyspnea, orthopnea, PND.  No problems with meds. Wants to come off coumadin. Has worn 30-day monitor in January-February with no AF. Has been checking BP religiously at home an SBP 115-120 range.    ECHO 7/17 EF 30%  Seems to be 35-40% on Definity images  ECHO 1/17  EF 25-30% RV ok Echo 1/18 EF 40-45%  Labs 07/14/15: K 5.3 Creatinine 1.98 Spiro stopped.  Labs 08/28/15: K 4.1 Creatinine 1.2  ROS: All systems negative except as listed in HPI, PMH and Problem List.  SH:  Social History   Social History  . Marital status: Divorced    Spouse name: N/A  . Number of children: N/A  . Years of education: N/A   Occupational History  . Not on file.   Social History Main Topics  . Smoking  status: Former Smoker    Types: Cigarettes    Quit date: 06/25/2015  . Smokeless tobacco: Former Neurosurgeon  . Alcohol use 0.0 oz/week     Comment: weekly  . Drug use: Unknown  . Sexual activity: Not on file   Other Topics Concern  . Not on file   Social History Narrative  . No narrative on file    FH:  Family History  Problem Relation Age of Onset  . Hypertension Mother     Past Medical History:  Diagnosis Date  . Diverticulitis    ACTIVE WITH ABCESS    Current Outpatient Prescriptions  Medication Sig Dispense Refill  . ENTRESTO 97-103 MG TAKE ONE TABLET BY MOUTH TWICE DAILY 60 tablet 6  . fluticasone (FLONASE) 50 MCG/ACT nasal spray Place 1 spray into both nostrils daily.    . furosemide (LASIX) 40 MG tablet TAKE ONE TABLET BY MOUTH ONCE DAILY 90 tablet 3  . metoprolol succinate (TOPROL-XL) 50 MG 24 hr tablet Take 1 tablet once daily 30 tablet 6  . Multiple Vitamins-Minerals (MULTIVITAL PO) Take by mouth daily. MENS MEGA MULTIVIT.     Marland Kitchen Probiotic Product (PROBIOTIC DAILY PO) Take 1 capsule by mouth daily.    Marland Kitchen warfarin (COUMADIN) 5 MG tablet TAKE ONE TABLET BY MOUTH ONCE DAILY 45 tablet 6   No current facility-administered medications for this encounter.     Vitals:   05/12/17 1324  BP: (!) 143/72  Pulse: 71  SpO2: 98%  Weight: 228 lb 6.4 oz (103.6 kg)   Wt Readings from Last 3 Encounters:  05/12/17 228 lb 6.4 oz (103.6 kg)  11/14/16 233 lb 12 oz (106 kg)  10/12/16 241 lb 3.2 oz (109.4 kg)    PHYSICAL EXAM: General:  Well appearing. No resp difficulty HEENT: normal Neck: supple. no JVD. Carotids 2+ bilat; no bruits. No lymphadenopathy or thryomegaly appreciated. Cor: PMI nondisplaced. Regular rate & rhythm. No rubs, gallops or murmurs. Lungs: clear Abdomen: obese soft, nontender, nondistended. Large ventral hernia. No hepatosplenomegaly. No bruits or masses. Good bowel sounds. Extremities: no cyanosis, clubbing, rash, edema Neuro: alert & orientedx3, cranial  nerves grossly intact. moves all 4 extremities w/o difficulty. Affect pleasant    ASSESSMENT & PLAN: 1. Chronic Systolic HF. LVEF 15-20% -> cardiogenic shock. Cath with minimal non-obstructive CAD --06/2015 [facility]RI- NICM EF 19% mod [name]. RV normal. No infiltrative process.  - Echo 1/18 EF 40-45% --NYHA I. Volume status stable exam.  Continue lasix 40 mg daily  --Intolerant carvedilol at the lowest dose due to feeling of doom.  --On toprol 50mg  qhs failed titration to 75mg  several times --Continue goal-dose Entresto 97-103 bid.   --Continue spiro 12.5. BMET today.  --EF had improved. Does not qualify for ICD  -- Repeat echo at next vist 2. NSVT - no significant ventricular arrhythmias on Event monitor  3. Morbid obesity - doing well with weight loss 4. 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 Off ASA with warfarin. 5. Paroxsymal A fib RVR 07/02/2015 ---CHADS Vasc Score 2.  - Has been off amio for several months.  - 30-day event monitor in January and February with no AF - Wants to stop coumadin. We discussed the slightly increased risk of stroke.  6. Former smoker  - quit 06/25/2015.  7. Large Ventral Hernia - Dr. Dwain Sarna following for possible surgery - Given improved LVEF would be ok to proceed with hernia repair from a CV standpoint with only mild to moderate risk of peri-op CV complications 8. HTN - BP elevated here but well controlled at home.  Arvilla Meres, MD  9:12 AM

## 2017-11-07 ENCOUNTER — Other Ambulatory Visit (HOSPITAL_COMMUNITY): Payer: Self-pay | Admitting: Student

## 2017-11-07 DIAGNOSIS — I5022 Chronic systolic (congestive) heart failure: Secondary | ICD-10-CM

## 2017-11-09 ENCOUNTER — Other Ambulatory Visit (HOSPITAL_COMMUNITY): Payer: Self-pay | Admitting: *Deleted

## 2017-11-09 DIAGNOSIS — I5022 Chronic systolic (congestive) heart failure: Secondary | ICD-10-CM

## 2017-11-09 MED ORDER — SACUBITRIL-VALSARTAN 97-103 MG PO TABS: 1.0000 | ORAL_TABLET | Freq: Two times a day (BID) | ORAL | 6 refills | Status: DC

## 2017-11-30 ENCOUNTER — Encounter: Payer: BLUE CROSS/BLUE SHIELD | Attending: Family Medicine | Admitting: Registered"

## 2017-11-30 ENCOUNTER — Encounter: Payer: Self-pay | Admitting: Registered"

## 2017-11-30 DIAGNOSIS — E119 Type 2 diabetes mellitus without complications: Secondary | ICD-10-CM | POA: Insufficient documentation

## 2017-11-30 DIAGNOSIS — E669 Obesity, unspecified: Secondary | ICD-10-CM | POA: Insufficient documentation

## 2017-11-30 DIAGNOSIS — I509 Heart failure, unspecified: Secondary | ICD-10-CM | POA: Insufficient documentation

## 2017-11-30 DIAGNOSIS — Z713 Dietary counseling and surveillance: Secondary | ICD-10-CM | POA: Insufficient documentation

## 2017-11-30 NOTE — Progress Notes (Signed)
Diabetes Self-Management Education  Visit Type: First/Initial  Appt. Start Time: 0905 Appt. End Time: 1020  11/30/2017  Mr. [name], identified by name and date of birth, is a 57 y.o. male with a diagnosis of Diabetes: Type 2.   ASSESSMENT Patient states he has changed his diet mostly by paying attention to portion control, keeping protein portions to the size of his palm and limiting carbs. 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    Patient states he was checking BG 2-3x per day and was able to identify BG patterns. Pt states the only time BG was out of range was FBG and corrected that with changing his evening snack after researching the Dawn and Somogyi Effect . RD taught how occasional testing after meals could help him feel more comfortable putting a few more carbs back in his diet, and PPBG checking daily is not necessary at this point. Patient states his friend saw a dietitian in [place] for DSME and shared the educational materials with the patient.   Patient reports history of Renaldo Fiddler diet, lost weight and kept off for ~10 years but regained weight after GI surgery for diverticulitis 10-Feb-2011. Pt states he almost died in 02-10-2015 from CHF due to virus that settled in heart. Patient states before he could get back on track taking care of his health, his father had a severe stroke and patinet put his self-care on the back burner while helping with his parents. Pt states his father died 11-Feb-2024his mother is 58 yrs old and in good health and very active.   Diabetes Self-Management Education - 11/30/17 0928      Visit Information   Visit Type  First/Initial      Initial Visit   Diabetes Type  Type 2    Are you currently following a meal plan?  Yes    What type of meal plan do you follow?  protein and greens    Are you taking your medications as prescribed?   Yes metformin 500 mg    Date Diagnosed  11/08/2017      Health Coping   How would you rate your overall health?  Good      Psychosocial Assessment   Patient Belief/Attitude about Diabetes  Motivated to manage diabetes    How often do you need to have someone help you when you read instructions, pamphlets, or other written materials from your doctor or pharmacy?  2 - Rarely    What is the last grade level you completed in school?  associates degree      Complications   Last HgB A1C per patient/outside source  6.7 %    How often do you check your blood sugar?  1-2 times/day    Fasting Blood glucose range (mg/dL)  16-109    Postprandial Blood glucose range (mg/dL)  60-454    Number of hypoglycemic episodes per month  0    Number of hyperglycemic episodes per week  0    Have you had a dilated eye exam in the past 12 months?  Yes    Have you had a dental exam in the past 12 months?  Yes    Are you checking your feet?  Yes    How many days per week are you checking your feet?  7      Dietary  Intake   Breakfast  eggs, spinach, bacon, water or coffee/1 tsp sugar    Snack (morning)  none    Lunch  pork chops, cauliflower, green beans OR steak OR fish 1x week OR chicken time depends when he starts work    Radio broadcast assistant (afternoon)  protein snack packs    Snack (evening)  cheese    Beverage(s)  water, coffee, rarely soda, 1/2 sweet 1/2 unsweet tea 2x week when goes out to eat      Exercise   Exercise Type  ADL's 6K steps at work    How many days per week to you exercise?  0    How many minutes per day do you exercise?  0    Total minutes per week of exercise  0      Patient Education   Previous Diabetes Education  No    Physical activity and exercise   Role of exercise on diabetes management, blood pressure control and cardiac health.    Medications  Reviewed patients medication for diabetes, action, purpose, timing of dose and side effects.    Monitoring  Identified appropriate SMBG and/or A1C  goals.;Taught/discussed recording of test results and interpretation of SMBG.    Psychosocial adjustment  Role of stress on diabetes      Individualized Goals (developed by patient)   Physical Activity  Exercise 3-5 times per week    Monitoring   test my blood glucose as discussed      Outcomes   Expected Outcomes  Demonstrated interest in learning. Expect positive outcomes    Future DMSE  PRN    Program Status  Completed     Individualized Plan for Diabetes Self-Management Training:   Learning Objective:  Patient will have a greater understanding of diabetes self-management. Patient education plan is to attend individual and/or group sessions per assessed needs and concerns.  Patient Instructions   Aim to have balanced meals and snacks, include protein when you have carbs  Consider having structure to your meal times/snacks, may help with low energy in afternoon.  Be sure to ask for help if you find yourself in a caregiver role: [facility] is a Chief Technology Officer for that.  Consider downloading app for your meter.  Continue with your plan to start exercising more.  Expected Outcomes:  Demonstrated interest in learning. Expect positive outcomes  Education material provided: A1C conversion sheet, My Plate and Carbohydrate counting sheet  If problems or questions, patient to contact team via:  Phone and MyChart  Future DSME appointment: PRN

## 2017-11-30 NOTE — Patient Instructions (Signed)
   Aim to have balanced meals and snacks, include protein when you have carbs  Consider having structure to your meal times/snacks, may help with low energy in afternoon.  Be sure to ask for help if you find yourself in a caregiver role: [facility] is a Chief Technology Officer for that.  Consider downloading app for your meter.  Continue with your plan to start exercising more.

## 2018-01-12 ENCOUNTER — Other Ambulatory Visit (HOSPITAL_COMMUNITY): Payer: Self-pay | Admitting: Cardiology

## 2018-02-01 ENCOUNTER — Other Ambulatory Visit (HOSPITAL_COMMUNITY): Payer: Self-pay | Admitting: Internal Medicine

## 2018-06-01 ENCOUNTER — Other Ambulatory Visit (HOSPITAL_COMMUNITY): Payer: Self-pay | Admitting: Student

## 2018-06-01 DIAGNOSIS — I5022 Chronic systolic (congestive) heart failure: Secondary | ICD-10-CM

## 2018-07-18 ENCOUNTER — Encounter (HOSPITAL_COMMUNITY): Payer: Self-pay | Admitting: Internal Medicine

## 2018-07-18 ENCOUNTER — Ambulatory Visit (HOSPITAL_COMMUNITY)
Admission: RE | Admit: 2018-07-18 | Discharge: 2018-07-18 | Disposition: A | Discharge status: Home / Self Care | Payer: BLUE CROSS/BLUE SHIELD | Source: Ambulatory Visit | Attending: Family Medicine | Admitting: Family Medicine

## 2018-07-18 ENCOUNTER — Ambulatory Visit (HOSPITAL_BASED_OUTPATIENT_CLINIC_OR_DEPARTMENT_OTHER)
Admission: RE | Admit: 2018-07-18 | Discharge: 2018-07-18 | Disposition: A | Discharge status: Home / Self Care | Payer: BLUE CROSS/BLUE SHIELD | Source: Ambulatory Visit | Attending: Internal Medicine | Admitting: Internal Medicine

## 2018-07-18 ENCOUNTER — Other Ambulatory Visit: Payer: Self-pay

## 2018-07-18 VITALS — BP 140/74 | HR 73 | Wt 199.0 lb

## 2018-07-18 DIAGNOSIS — R0609 Other forms of dyspnea: Secondary | ICD-10-CM | POA: Diagnosis not present

## 2018-07-18 DIAGNOSIS — E785 Hyperlipidemia, unspecified: Secondary | ICD-10-CM | POA: Diagnosis not present

## 2018-07-18 DIAGNOSIS — Z7982 Long term (current) use of aspirin: Secondary | ICD-10-CM | POA: Diagnosis not present

## 2018-07-18 DIAGNOSIS — I110 Hypertensive heart disease with heart failure: Secondary | ICD-10-CM | POA: Insufficient documentation

## 2018-07-18 DIAGNOSIS — I5022 Chronic systolic (congestive) heart failure: Secondary | ICD-10-CM | POA: Diagnosis not present

## 2018-07-18 DIAGNOSIS — I2510 Atherosclerotic heart disease of native coronary artery without angina pectoris: Secondary | ICD-10-CM | POA: Diagnosis not present

## 2018-07-18 DIAGNOSIS — Z6839 Body mass index (BMI) 39.0-39.9, adult: Secondary | ICD-10-CM | POA: Diagnosis not present

## 2018-07-18 DIAGNOSIS — I428 Other cardiomyopathies: Secondary | ICD-10-CM | POA: Insufficient documentation

## 2018-07-18 DIAGNOSIS — I351 Nonrheumatic aortic (valve) insufficiency: Secondary | ICD-10-CM | POA: Diagnosis not present

## 2018-07-18 DIAGNOSIS — I480 Paroxysmal atrial fibrillation: Secondary | ICD-10-CM | POA: Insufficient documentation

## 2018-07-18 DIAGNOSIS — I472 Ventricular tachycardia: Secondary | ICD-10-CM | POA: Diagnosis not present

## 2018-07-18 DIAGNOSIS — Z720 Tobacco use: Secondary | ICD-10-CM | POA: Diagnosis not present

## 2018-07-18 DIAGNOSIS — Z8249 Family history of ischemic heart disease and other diseases of the circulatory system: Secondary | ICD-10-CM | POA: Insufficient documentation

## 2018-07-18 DIAGNOSIS — I10 Essential (primary) hypertension: Secondary | ICD-10-CM | POA: Diagnosis not present

## 2018-07-18 DIAGNOSIS — K439 Ventral hernia without obstruction or gangrene: Secondary | ICD-10-CM | POA: Insufficient documentation

## 2018-07-18 DIAGNOSIS — Z79899 Other long term (current) drug therapy: Secondary | ICD-10-CM | POA: Insufficient documentation

## 2018-07-18 DIAGNOSIS — Z7984 Long term (current) use of oral hypoglycemic drugs: Secondary | ICD-10-CM | POA: Diagnosis not present

## 2018-07-18 DIAGNOSIS — E119 Type 2 diabetes mellitus without complications: Secondary | ICD-10-CM | POA: Insufficient documentation

## 2018-07-18 NOTE — Addendum Note (Signed)
Encounter addended by: Marisa Hua, RN on: 07/18/2018 11:59 AM  Actions taken: Sign clinical note

## 2018-07-18 NOTE — Patient Instructions (Signed)
Your physician recommends that you schedule a follow-up appointment in: 1 year  

## 2018-07-18 NOTE — Progress Notes (Signed)
  Echocardiogram 2D Echocardiogram has been performed.  [name] 07/18/2018, 10:41 AM

## 2018-07-18 NOTE — Progress Notes (Signed)
Advanced Heart Failure Clinic Note   Patient ID: [name], male   DOB: 07/10/1961, 57 y.o.   MRN: [id number]   PCP: Dr. Orvan July Primary HF Cardiologist: Dr Gala Romney  HPI: Mr Criswell is a 62 with systolic HF due to NICM, PAF and HTN.   Admitted 06/25/15 with progressive dyspnea on exertion. An ECHO was performed and showed severly depressed EF 25-30%. He was diuresed with IV lasix and once optimized he was taken to the cath lab for RHC/ LHC. This showed minimal obstructive CAD, severe NICM, elevated filling pressures, and cardiogenic shock. At that point he was started on milrinone but later weaned off as he improved with adequate mixed venous saturation. cMRI performed NICM, EF 19%, mod MR, normal RV, and no infiltrative process or scar. Also had A fib RVR but chemically converted with amio 200 mg twice daily. Started on coumadin. (Refused DOAC)  He insisted on stopping anticoagulation. Has worn 30-day monitor in January-February with no AF. AC stopped  He returns today for HF follow up. Feels great. Has changed his diet and lost 40 pounds. Eating more protein and less carbs. Denies CP or SOB. Active getting 6k-12k/day. Still working at [employer] as a Holiday representative. Follows BP closely at home.    Echo today (07/18/18)  EF 50-50% personally reviewed    ECHO 7/17 EF 30%  Seems to be 35-40% on Definity images  ECHO 1/17  EF 25-30% RV ok Echo 1/18 EF 40-45%   Review of systems complete and found to be negative unless listed in HPI.   SH:  Social History   Socioeconomic History  . Marital status: Divorced    Spouse name: Not on file  . Number of children: Not on file  . Years of education: Not on file  . Highest education level: Not on file  Occupational History  . Not on file  Social Needs  . Financial resource strain: Not on file  . Food insecurity:    Worry: Not on file    Inability: Not on file  . Transportation needs:    Medical: Not on file   Non-medical: Not on file  Tobacco Use  . Smoking status: Former Smoker    Types: Cigarettes    Last attempt to quit: 06/25/2015    Years since quitting: 3.0  . Smokeless tobacco: Former Engineer, water and Sexual Activity  . Alcohol use: Yes    Alcohol/week: 0.0 standard drinks    Comment: weekly  . Drug use: Not on file  . Sexual activity: Not on file  Lifestyle  . Physical activity:    Days per week: Not on file    Minutes per session: Not on file  . Stress: Not on file  Relationships  . Social connections:    Talks on phone: Not on file    Gets together: Not on file    Attends religious service: Not on file    Active member of club or organization: Not on file    Attends meetings of clubs or organizations: Not on file    Relationship status: Not on file  . Intimate partner violence:    Fear of current or ex partner: Not on file    Emotionally abused: Not on file    Physically abused: Not on file    Forced sexual activity: Not on file  Other Topics Concern  . Not on file  Social History Narrative  . Not on file    FH:  Family History  Problem Relation Age of Onset  . Hypertension Mother     Past Medical History:  Diagnosis Date  . Diverticulitis    ACTIVE WITH ABCESS    Current Outpatient Medications  Medication Sig Dispense Refill  . aspirin 81 MG tablet Take 81 mg by mouth daily.    Marland Kitchen ENTRESTO 97-103 MG TAKE 1 TABLET BY MOUTH TWICE DAILY 60 tablet 1  . fluticasone (FLONASE) 50 MCG/ACT nasal spray Place 1 spray into both nostrils daily.    . furosemide (LASIX) 40 MG tablet Take 1 tablet (40 mg total) by mouth daily. PLEASE CALL FOR FOLLOW UP [phone] 90 tablet 3  . metFORMIN (GLUCOPHAGE) 500 MG tablet Take by mouth 2 (two) times daily with a meal.    . metoprolol succinate (TOPROL-XL) 50 MG 24 hr tablet Take 1 tablet once daily 30 tablet 6  . metoprolol succinate (TOPROL-XL) 50 MG 24 hr tablet TAKE 1 TABLET BY MOUTH ONCE DAILY 90 tablet 3  . Multiple  Vitamins-Minerals (MULTIVITAL PO) Take by mouth daily. MENS MEGA MULTIVIT.     Marland Kitchen Probiotic Product (PROBIOTIC DAILY PO) Take 1 capsule by mouth daily.     No current facility-administered medications for this encounter.     Vitals:   07/18/18 1115  BP: 140/74  Pulse: 73  SpO2: 100%  Weight: 90.3 kg (199 lb)   Wt Readings from Last 3 Encounters:  05/12/17 103.6 kg (228 lb 6.4 oz)  11/14/16 106 kg (233 lb 12 oz)  10/12/16 109.4 kg (241 lb 3.2 oz)    PHYSICAL EXAM: General:  Well appearing. No resp difficulty HEENT: normal Neck: supple. no JVD. Carotids 2+ bilat; no bruits. No lymphadenopathy or thryomegaly appreciated. Cor: PMI nondisplaced. Regular rate & rhythm. No rubs, gallops or murmurs. Lungs: clear Abdomen: obese soft, nontender, nondistended. No hepatosplenomegaly. No bruits or masses. Good bowel sounds. Large hernia Extremities: no cyanosis, clubbing, rash, edema Neuro: alert & orientedx3, cranial nerves grossly intact. moves all 4 extremities w/o difficulty. Affect pleasant  ECG: NSR 67 No ST-T wave abnormalities. Personally reviewed   ASSESSMENT & PLAN: 1. Chronic Systolic HF. LVEF 15-20% -> cardiogenic shock. Cath with minimal non-obstructive CAD --06/2015 CMRI- NICM EF 19% mod MR. RV normal. No infiltrative process.  - Echo 1/18 EF 40-45% - Echo today EF 50-55% --NYHA I. Volume status looks great.  Will switch lasix to as needed --Intolerant carvedilol at the lowest dose due to feeling of doom.  --On toprol 50mg  qhs failed titration to 75mg  several times --Continue goal-dose Entresto 97-103 bid.   --Continue spiro 12.5.  --EF had improved. Does not qualify for ICD  --If continues to lose weight may need to cut meds back 2. NSVT - no significant ventricular arrhythmias on Event monitor 09/2016 3. Morbid obesity - has lost 40 pounds. Congratulated with him on weight loss.  4. 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 Off ASA with warfarin. No  s/s ischemia. 5. Paroxsymal A fib RVR 07/02/2015 ---CHADS Vasc Score 2.  - Has been off amio. - 30-day event monitor in January and February with no AF - He has stopped taking coumadin. Aware of increased risk of stroke.  6. Former smoker  - quit 06/25/2015.  7. Large Ventral Hernia - Considering mesh repair. Following with Dr. Dwain Sarna. - Given improved LVEF would be ok to proceed with hernia repair from a CV standpoint with only mild to moderate risk of peri-op CV complications 8. HTN - BP doing well  SBP 100-120 9. DM - followed by Dr. Shea Evans, MD  11:52 AM

## 2018-08-02 ENCOUNTER — Other Ambulatory Visit (HOSPITAL_COMMUNITY): Payer: Self-pay | Admitting: Cardiology

## 2018-08-02 DIAGNOSIS — I5022 Chronic systolic (congestive) heart failure: Secondary | ICD-10-CM

## 2018-09-21 ENCOUNTER — Telehealth (HOSPITAL_COMMUNITY): Payer: Self-pay

## 2018-09-21 NOTE — Telephone Encounter (Signed)
Information requested by preventice services was faxed to Delaney Meigs / Clydie Braun at [phone]

## 2019-01-14 ENCOUNTER — Other Ambulatory Visit (HOSPITAL_COMMUNITY): Payer: Self-pay | Admitting: Cardiology

## 2019-01-14 DIAGNOSIS — I5022 Chronic systolic (congestive) heart failure: Secondary | ICD-10-CM

## 2019-04-27 ENCOUNTER — Other Ambulatory Visit (HOSPITAL_COMMUNITY): Payer: Self-pay | Admitting: Cardiology

## 2019-04-27 DIAGNOSIS — I5022 Chronic systolic (congestive) heart failure: Secondary | ICD-10-CM

## 2019-05-14 ENCOUNTER — Other Ambulatory Visit: Payer: Self-pay | Admitting: Family Medicine

## 2019-05-14 DIAGNOSIS — R221 Localized swelling, mass and lump, neck: Secondary | ICD-10-CM

## 2019-06-18 ENCOUNTER — Other Ambulatory Visit (HOSPITAL_COMMUNITY): Payer: Self-pay | Admitting: Cardiology

## 2019-07-11 ENCOUNTER — Ambulatory Visit
Admission: RE | Admit: 2019-07-11 | Discharge: 2019-07-11 | Disposition: A | Discharge status: Home / Self Care | Payer: BLUE CROSS/BLUE SHIELD | Source: Ambulatory Visit | Attending: Family Medicine | Admitting: Family Medicine

## 2019-07-11 DIAGNOSIS — R221 Localized swelling, mass and lump, neck: Secondary | ICD-10-CM

## 2019-08-30 ENCOUNTER — Telehealth (HOSPITAL_COMMUNITY): Payer: Self-pay

## 2019-08-30 ENCOUNTER — Other Ambulatory Visit: Payer: Self-pay

## 2019-08-30 DIAGNOSIS — I6529 Occlusion and stenosis of unspecified carotid artery: Secondary | ICD-10-CM

## 2019-08-30 NOTE — Telephone Encounter (Signed)

## 2019-09-02 ENCOUNTER — Ambulatory Visit (HOSPITAL_COMMUNITY)
Admission: RE | Admit: 2019-09-02 | Discharge: 2019-09-02 | Disposition: A | Discharge status: Home / Self Care | Payer: BC Managed Care – PPO | Source: Ambulatory Visit | Attending: Surgery | Admitting: Surgery

## 2019-09-02 ENCOUNTER — Ambulatory Visit (INDEPENDENT_AMBULATORY_CARE_PROVIDER_SITE_OTHER): Payer: BC Managed Care – PPO | Admitting: Surgery

## 2019-09-02 ENCOUNTER — Encounter: Payer: Self-pay | Admitting: Surgery

## 2019-09-02 ENCOUNTER — Other Ambulatory Visit: Payer: Self-pay

## 2019-09-02 VITALS — BP 136/82 | HR 74 | Temp 98.0°F | Resp 20 | Ht 64.0 in | Wt 194.0 lb

## 2019-09-02 DIAGNOSIS — I6529 Occlusion and stenosis of unspecified carotid artery: Secondary | ICD-10-CM | POA: Insufficient documentation

## 2019-09-02 DIAGNOSIS — I6521 Occlusion and stenosis of right carotid artery: Secondary | ICD-10-CM

## 2019-09-02 NOTE — Progress Notes (Signed)
[facility]  Patient name: [name] MRN: [id number] DOB: 21-Dec-1960 Sex: male   REQUESTING PROVIDER:    Dr. Drema Dallas   REASON FOR CONSULT:    Carotid stenosis  HISTORY OF PRESENT ILLNESS:   [name] is a 58 y.o. male, who is referred for evaluation of carotid stenosis.  This was detected when he had an ultrasound for a neck nodule.  He denies any symptoms.  Specifically he denies numbness or weakness in either extremity.  He denies slurred speech.  He denies amaurosis fugax.  He is a diabetic however his most recent hemoglobin A1c was 5.8.  He has a significant history of congestive heart failure.  When he initially presented his ejection fraction was around 10% however this is gone up to 60%.  He is very active at work and consistently walks 15-19,000 steps.  The patient refuses to take a statin.  He is a former smoker  PAST MEDICAL HISTORY    Past Medical History:  Diagnosis Date  . CHF (congestive heart failure) ([place])   . Diabetes mellitus without complication ([place])   . Diverticulitis    ACTIVE WITH ABCESS  . Hypertension      FAMILY HISTORY   Family History  Problem Relation Age of Onset  . Hypertension Mother     SOCIAL HISTORY:   Social History   Socioeconomic History  . Marital status: Divorced    Spouse name: Not on file  . Number of children: Not on file  . Years of education: Not on file  . Highest education level: Not on file  Occupational History  . Not on file  Tobacco Use  . Smoking status: Former Smoker    Types: Cigarettes    Quit date: 06/25/2015    Years since quitting: 4.1  . Smokeless tobacco: Former Network engineer and Sexual Activity  . Alcohol use: Not Currently    Alcohol/week: 0.0 standard drinks    Comment: weekly  . Drug use: Never  . Sexual activity: Not on file  Other Topics Concern  . Not on file  Social History Narrative  . Not on file     Social Determinants of Health   Financial Resource Strain:   . Difficulty of Paying Living Expenses: Not on file  Food Insecurity:   . Worried About Charity fundraiser in the Last Year: Not on file  . Ran Out of Food in the Last Year: Not on file  Transportation Needs:   . Lack of Transportation (Medical): Not on file  . Lack of Transportation (Non-Medical): Not on file  Physical Activity:   . Days of Exercise per Week: Not on file  . Minutes of Exercise per Session: Not on file  Stress:   . Feeling of Stress : Not on file  Social Connections:   . Frequency of Communication with Friends and Family: Not on file  . Frequency of Social Gatherings with Friends and Family: Not on file  . Attends Religious Services: Not on file  . Active Member of Clubs or Organizations: Not on file  . Attends Archivist Meetings: Not on file  . Marital Status: Not on file  Intimate Partner Violence:   . Fear of Current or Ex-Partner: Not on file  . Emotionally Abused: Not on file  . Physically Abused: Not on file  . Sexually Abused: Not on file    ALLERGIES:    Allergies  Allergen Reactions  .  Pseudoephedrine     REACTION: Elevated HR  . Carvedilol Other (See Comments)    Pain across chest, feeling of impending doom    CURRENT MEDICATIONS:    Current Outpatient Medications  Medication Sig Dispense Refill  . aspirin 81 MG tablet Take 81 mg by mouth daily.    Marland Kitchen BLACK ELDERBERRY,BERRY-FLOWER, PO Take by mouth.    . Cholecalciferol (VITAMIN D3) 20 MCG (800 UNIT) TABS Take by mouth.    . co-enzyme Q-10 30 MG capsule Take 30 mg by mouth 3 (three) times daily.    Marland Kitchen ENTRESTO 97-103 MG Take 1 tablet by mouth twice daily 180 tablet 3  . furosemide (LASIX) 40 MG tablet Take 1 tablet (40 mg total) by mouth daily. Please call for office visit [phone] 90 tablet 0  . metFORMIN (GLUCOPHAGE) 500 MG tablet Take 500 mg by mouth daily with breakfast.     . metoprolol succinate  (TOPROL-XL) 50 MG 24 hr tablet Take 1 tablet once daily 30 tablet 6  . Multiple Vitamins-Minerals (MULTIVITAL PO) Take by mouth daily. MENS MEGA MULTIVIT.     Marland Kitchen Omega-3 Fatty Acids (FISH OIL) 1000 MG CAPS Take by mouth.    . Probiotic Product (PROBIOTIC DAILY PO) Take 1 capsule by mouth daily.    . fluticasone (FLONASE) 50 MCG/ACT nasal spray Place 1 spray into both nostrils daily.     No current facility-administered medications for this visit.    REVIEW OF SYSTEMS:   [X]  denotes positive finding, [ ]  denotes negative finding Cardiac  Comments:  Chest pain or chest pressure:    Shortness of breath upon exertion:    Short of breath when lying flat:    Irregular heart rhythm:        Vascular    Pain in calf, thigh, or hip brought on by ambulation:    Pain in feet at night that wakes you up from your sleep:     Blood clot in your veins:    Leg swelling:         Pulmonary    Oxygen at home:    Productive cough:     Wheezing:         Neurologic    Sudden weakness in arms or legs:     Sudden numbness in arms or legs:     Sudden onset of difficulty speaking or slurred speech:    Temporary loss of vision in one eye:     Problems with dizziness:         Gastrointestinal    Blood in stool:      Vomited blood:         Genitourinary    Burning when urinating:     Blood in urine:        Psychiatric    Major depression:         Hematologic    Bleeding problems:    Problems with blood clotting too easily:        Skin    Rashes or ulcers:        Constitutional    Fever or chills:     PHYSICAL EXAM:   Vitals:   09/02/19 1506 09/02/19 1508  BP: 132/81 136/82  Pulse: 74   Resp: 20   Temp: 98 F (36.7 C)   SpO2: 97%   Weight: 88 kg   Height: 5\' 4"  (1.626 m)     GENERAL: The patient is a well-nourished male, in no acute distress. The  vital signs are documented above. CARDIAC: There is a regular rate and rhythm.  PULMONARY: Nonlabored  respirations MUSCULOSKELETAL: There are no major deformities or cyanosis. NEUROLOGIC: No focal weakness or paresthesias are detected. SKIN: There are no ulcers or rashes noted. PSYCHIATRIC: The patient has a normal affect.  STUDIES:   I have reviewed the following: Right Carotid: Velocities in the right ICA are consistent with a 80-99%                stenosis.  Left Carotid: Velocities in the left ICA are consistent with a 1-39% stenosis,               the bifurcation is low in the neck.   ASSESSMENT and PLAN   Asymptomatic right carotid stenosis: We discussed the treatment options including medical management, versus stenting, versus endarterectomy.  Because he does not want any additional medication and his refusal to take a statin, I do not think he is a good stent candidate.  Therefore we discussed proceeding with right carotid arterectomy.  I discussed the risk and benefits of the operation including the risk of stroke and nerve injury.  All of his questions were answered.  I will get this scheduled at his earliest convenience.   Charlena Cross, MD, FACS [facility] [phone] Pager [phone]

## 2019-09-02 NOTE — H&P (View-Only) (Signed)
 [facility]  Patient name: [name] MRN: [id number] DOB: 09/23/1960 Sex: male   REQUESTING PROVIDER:    Dr. Barnes   REASON FOR CONSULT:    Carotid stenosis  HISTORY OF PRESENT ILLNESS:   [name] is a 58 y.o. male, who is referred for evaluation of carotid stenosis.  This was detected when he had an ultrasound for a neck nodule.  He denies any symptoms.  Specifically he denies numbness or weakness in either extremity.  He denies slurred speech.  He denies amaurosis fugax.  He is a diabetic however his most recent hemoglobin A1c was 5.8.  He has a significant history of congestive heart failure.  When he initially presented his ejection fraction was around 10% however this is gone up to 60%.  He is very active at work and consistently walks 15-19,000 steps.  The patient refuses to take a statin.  He is a former smoker  PAST MEDICAL HISTORY    Past Medical History:  Diagnosis Date  . CHF (congestive heart failure) (HCC)   . Diabetes mellitus without complication (HCC)   . Diverticulitis    ACTIVE WITH ABCESS  . Hypertension      FAMILY HISTORY   Family History  Problem Relation Age of Onset  . Hypertension Mother     SOCIAL HISTORY:   Social History   Socioeconomic History  . Marital status: Divorced    Spouse name: Not on file  . Number of children: Not on file  . Years of education: Not on file  . Highest education level: Not on file  Occupational History  . Not on file  Tobacco Use  . Smoking status: Former Smoker    Types: Cigarettes    Quit date: 06/25/2015    Years since quitting: 4.1  . Smokeless tobacco: Former User  Substance and Sexual Activity  . Alcohol use: Not Currently    Alcohol/week: 0.0 standard drinks    Comment: weekly  . Drug use: Never  . Sexual activity: Not on file  Other Topics Concern  . Not on file  Social History Narrative  . Not on file     Social Determinants of Health   Financial Resource Strain:   . Difficulty of Paying Living Expenses: Not on file  Food Insecurity:   . Worried About Running Out of Food in the Last Year: Not on file  . Ran Out of Food in the Last Year: Not on file  Transportation Needs:   . Lack of Transportation (Medical): Not on file  . Lack of Transportation (Non-Medical): Not on file  Physical Activity:   . Days of Exercise per Week: Not on file  . Minutes of Exercise per Session: Not on file  Stress:   . Feeling of Stress : Not on file  Social Connections:   . Frequency of Communication with Friends and Family: Not on file  . Frequency of Social Gatherings with Friends and Family: Not on file  . Attends Religious Services: Not on file  . Active Member of Clubs or Organizations: Not on file  . Attends Club or Organization Meetings: Not on file  . Marital Status: Not on file  Intimate Partner Violence:   . Fear of Current or Ex-Partner: Not on file  . Emotionally Abused: Not on file  . Physically Abused: Not on file  . Sexually Abused: Not on file    ALLERGIES:    Allergies  Allergen Reactions  .   Pseudoephedrine     REACTION: Elevated HR  . Carvedilol Other (See Comments)    Pain across chest, feeling of impending doom    CURRENT MEDICATIONS:    Current Outpatient Medications  Medication Sig Dispense Refill  . aspirin 81 MG tablet Take 81 mg by mouth daily.    Marland Kitchen BLACK ELDERBERRY,BERRY-FLOWER, PO Take by mouth.    . Cholecalciferol (VITAMIN D3) 20 MCG (800 UNIT) TABS Take by mouth.    . co-enzyme Q-10 30 MG capsule Take 30 mg by mouth 3 (three) times daily.    Marland Kitchen ENTRESTO 97-103 MG Take 1 tablet by mouth twice daily 180 tablet 3  . furosemide (LASIX) 40 MG tablet Take 1 tablet (40 mg total) by mouth daily. Please call for office visit [phone] 90 tablet 0  . metFORMIN (GLUCOPHAGE) 500 MG tablet Take 500 mg by mouth daily with breakfast.     . metoprolol succinate  (TOPROL-XL) 50 MG 24 hr tablet Take 1 tablet once daily 30 tablet 6  . Multiple Vitamins-Minerals (MULTIVITAL PO) Take by mouth daily. MENS MEGA MULTIVIT.     Marland Kitchen Omega-3 Fatty Acids (FISH OIL) 1000 MG CAPS Take by mouth.    . Probiotic Product (PROBIOTIC DAILY PO) Take 1 capsule by mouth daily.    . fluticasone (FLONASE) 50 MCG/ACT nasal spray Place 1 spray into both nostrils daily.     No current facility-administered medications for this visit.    REVIEW OF SYSTEMS:   [X]  denotes positive finding, [ ]  denotes negative finding Cardiac  Comments:  Chest pain or chest pressure:    Shortness of breath upon exertion:    Short of breath when lying flat:    Irregular heart rhythm:        Vascular    Pain in calf, thigh, or hip brought on by ambulation:    Pain in feet at night that wakes you up from your sleep:     Blood clot in your veins:    Leg swelling:         Pulmonary    Oxygen at home:    Productive cough:     Wheezing:         Neurologic    Sudden weakness in arms or legs:     Sudden numbness in arms or legs:     Sudden onset of difficulty speaking or slurred speech:    Temporary loss of vision in one eye:     Problems with dizziness:         Gastrointestinal    Blood in stool:      Vomited blood:         Genitourinary    Burning when urinating:     Blood in urine:        Psychiatric    Major depression:         Hematologic    Bleeding problems:    Problems with blood clotting too easily:        Skin    Rashes or ulcers:        Constitutional    Fever or chills:     PHYSICAL EXAM:   Vitals:   09/02/19 1506 09/02/19 1508  BP: 132/81 136/82  Pulse: 74   Resp: 20   Temp: 98 F (36.7 C)   SpO2: 97%   Weight: 88 kg   Height: 5\' 4"  (1.626 m)     GENERAL: The patient is a well-nourished male, in no acute distress. The  vital signs are documented above. CARDIAC: There is a regular rate and rhythm.  PULMONARY: Nonlabored  respirations MUSCULOSKELETAL: There are no major deformities or cyanosis. NEUROLOGIC: No focal weakness or paresthesias are detected. SKIN: There are no ulcers or rashes noted. PSYCHIATRIC: The patient has a normal affect.  STUDIES:   I have reviewed the following: Right Carotid: Velocities in the right ICA are consistent with a 80-99%                stenosis.  Left Carotid: Velocities in the left ICA are consistent with a 1-39% stenosis,               the bifurcation is low in the neck.   ASSESSMENT and PLAN   Asymptomatic right carotid stenosis: We discussed the treatment options including medical management, versus stenting, versus endarterectomy.  Because he does not want any additional medication and his refusal to take a statin, I do not think he is a good stent candidate.  Therefore we discussed proceeding with right carotid arterectomy.  I discussed the risk and benefits of the operation including the risk of stroke and nerve injury.  All of his questions were answered.  I will get this scheduled at his earliest convenience.   Wells Conan Mcmanaway, IV, MD, FACS [facility] [place] Tel [phone] Pager [phone] 

## 2019-09-03 ENCOUNTER — Other Ambulatory Visit: Payer: Self-pay | Admitting: *Deleted

## 2019-09-03 NOTE — Progress Notes (Signed)
Patient instructed to be at [facility] admitting on 09/11/2019 at 5:30 am for surgery with Dr. Trula Slade. NPO past MN night prior and plan for overnight stay. Expect a call from the [facility] pre-admission department with detailed instructions for surgery,date and time of pre-op nasal swab testing for Covid-19 and which medications to take morning of surgery. Instructed to continue ASA per Dr. Trula Slade. Verbalized understanding.

## 2019-09-04 NOTE — Progress Notes (Signed)
[place], [place] [name] [place] [address] Phone: [phone] Fax: [phone]      Your procedure is scheduled on Wednesday 09/11/2019.  Report to [facility] Main Entrance "A" at 05:30 A.M., and check in at the Admitting office.  Call this number if you have problems the morning of surgery:  [phone]   Call [phone] if you have any questions prior to your surgery date Monday-Friday 8am-4pm    Remember:  Do not eat or drink after midnight the night before your surgery   Take these medicines the morning of surgery with A SIP OF WATER: Acetaminophen (Tylenol) - if needed  7 days prior to surgery STOP taking any Aleve, Naproxen, Ibuprofen, Motrin, Advil, Goody's, BC's, all herbal medications, fish oil, and all vitamins.   WHAT DO I DO ABOUT MY DIABETES MEDICATION?  Marland Kitchen Do not take oral diabetes medicines (pills) the morning of surgery. - Do NOT take Metformin (Glucophage) the morning of surgery.   HOW TO MANAGE YOUR DIABETES BEFORE AND AFTER SURGERY  Why is it important to control my blood sugar before and after surgery? . Improving blood sugar levels before and after surgery helps healing and can limit problems. . A way of improving blood sugar control is eating a healthy diet by: o  Eating less sugar and carbohydrates o  Increasing activity/exercise o  Talking with your doctor about reaching your blood sugar goals . High blood sugars (greater than 180 mg/dL) can raise your risk of infections and slow your recovery, so you will need to focus on controlling your diabetes during the weeks before surgery. . Make sure that the doctor who takes care of your diabetes knows about your planned surgery including the date and location.  How do I manage my blood sugar before surgery? . Check your blood sugar at least 4 times a day, starting 2 days before surgery, to make sure that the level is not too high or  low. . Check your blood sugar the morning of your surgery when you wake up and every 2 hours until you get to the Short Stay unit. o If your blood sugar is less than 70 mg/dL, you will need to treat for low blood sugar: - Do not take insulin. - Treat a low blood sugar (less than 70 mg/dL) with  cup of clear juice (cranberry or apple), 4 glucose tablets, OR glucose gel. - Recheck blood sugar in 15 minutes after treatment (to make sure it is greater than 70 mg/dL). If your blood sugar is not greater than 70 mg/dL on recheck, call [phone] for further instructions. . Report your blood sugar to the short stay nurse when you get to Short Stay.  . If you are admitted to the hospital after surgery: o Your blood sugar will be checked by the staff and you will probably be given insulin after surgery (instead of oral diabetes medicines) to make sure you have good blood sugar levels. o The goal for blood sugar control after surgery is 80-180 mg/dL.     The Morning of Surgery  Do not wear jewelry.  Do not wear lotions, powders, colognes, or deodorant  Men may shave face and neck.  Do not bring valuables to the hospital.  [facility] is not responsible for any belongings or valuables.  If you are a smoker, DO NOT Smoke 24 hours prior to surgery  If you wear a CPAP at night  please bring your mask, tubing, and machine the morning of surgery   Remember that you must have someone to transport you home after your surgery, and remain with you for 24 hours if you are discharged the same day.   Please bring cases for contacts, glasses, hearing aids, dentures or bridgework because it cannot be worn into surgery.    Leave your suitcase in the car.  After surgery it may be brought to your room.  For patients admitted to the hospital, discharge time will be determined by your treatment team.  Patients discharged the day of surgery will not be allowed to drive home.    Special instructions:   [facility]- Preparing For Surgery  Before surgery, you can play an important role. Because skin is not sterile, your skin needs to be as free of germs as possible. You can reduce the number of germs on your skin by washing with CHG (chlorahexidine gluconate) Soap before surgery.  CHG is an antiseptic cleaner which kills germs and bonds with the skin to continue killing germs even after washing.    Oral Hygiene is also important to reduce your risk of infection.  Remember - BRUSH YOUR TEETH THE MORNING OF SURGERY WITH YOUR REGULAR TOOTHPASTE  Please do not use if you have an allergy to CHG or antibacterial soaps. If your skin becomes reddened/irritated stop using the CHG.  Do not shave (including legs and underarms) for at least 48 hours prior to first CHG shower. It is OK to shave your face.  Please follow these instructions carefully.   1. Shower the NIGHT BEFORE SURGERY and the MORNING OF SURGERY with CHG Soap.   2. If you chose to wash your hair, wash your hair first as usual with your normal shampoo.  3. After you shampoo, rinse your hair and body thoroughly to remove the shampoo.  4. Use CHG as you would any other liquid soap. You can apply CHG directly to the skin and wash gently with a scrungie or a clean washcloth.   5. Apply the CHG Soap to your body ONLY FROM THE NECK DOWN.  Do not use on open wounds or open sores. Avoid contact with your eyes, ears, mouth and genitals (private parts). Wash Face and genitals (private parts)  with your normal soap.   6. Wash thoroughly, paying special attention to the area where your surgery will be performed.  7. Thoroughly rinse your body with warm water from the neck down.  8. DO NOT shower/wash with your normal soap after using and rinsing off the CHG Soap.  9. Pat yourself dry with a CLEAN TOWEL.  10. Wear CLEAN PAJAMAS to bed the night before surgery, wear comfortable clothes the morning of surgery  11. Place CLEAN SHEETS on your bed the  night of your first shower and DO NOT SLEEP WITH PETS.    Day of Surgery:  Please shower the morning of surgery with the CHG soap Do not apply any deodorants/lotions. Please wear clean clothes to the hospital/surgery center.   Remember to brush your teeth WITH YOUR REGULAR TOOTHPASTE.   Please read over the following fact sheets that you were given.

## 2019-09-05 ENCOUNTER — Other Ambulatory Visit: Payer: Self-pay

## 2019-09-05 ENCOUNTER — Encounter (HOSPITAL_COMMUNITY): Payer: Self-pay

## 2019-09-05 ENCOUNTER — Encounter (HOSPITAL_COMMUNITY)
Admission: RE | Admit: 2019-09-05 | Discharge: 2019-09-05 | Disposition: A | Discharge status: Home / Self Care | Payer: BC Managed Care – PPO | Source: Ambulatory Visit | Attending: Surgery | Admitting: Surgery

## 2019-09-05 DIAGNOSIS — Z7901 Long term (current) use of anticoagulants: Secondary | ICD-10-CM | POA: Insufficient documentation

## 2019-09-05 DIAGNOSIS — I110 Hypertensive heart disease with heart failure: Secondary | ICD-10-CM | POA: Insufficient documentation

## 2019-09-05 DIAGNOSIS — Z7984 Long term (current) use of oral hypoglycemic drugs: Secondary | ICD-10-CM | POA: Insufficient documentation

## 2019-09-05 DIAGNOSIS — Z7982 Long term (current) use of aspirin: Secondary | ICD-10-CM | POA: Diagnosis not present

## 2019-09-05 DIAGNOSIS — Z79899 Other long term (current) drug therapy: Secondary | ICD-10-CM | POA: Diagnosis not present

## 2019-09-05 DIAGNOSIS — Z01812 Encounter for preprocedural laboratory examination: Secondary | ICD-10-CM | POA: Insufficient documentation

## 2019-09-05 DIAGNOSIS — M1990 Unspecified osteoarthritis, unspecified site: Secondary | ICD-10-CM | POA: Insufficient documentation

## 2019-09-05 DIAGNOSIS — E1151 Type 2 diabetes mellitus with diabetic peripheral angiopathy without gangrene: Secondary | ICD-10-CM | POA: Insufficient documentation

## 2019-09-05 DIAGNOSIS — Z87891 Personal history of nicotine dependence: Secondary | ICD-10-CM | POA: Insufficient documentation

## 2019-09-05 DIAGNOSIS — I6521 Occlusion and stenosis of right carotid artery: Secondary | ICD-10-CM | POA: Diagnosis not present

## 2019-09-05 DIAGNOSIS — I509 Heart failure, unspecified: Secondary | ICD-10-CM | POA: Insufficient documentation

## 2019-09-05 DIAGNOSIS — I2510 Atherosclerotic heart disease of native coronary artery without angina pectoris: Secondary | ICD-10-CM | POA: Diagnosis not present

## 2019-09-05 DIAGNOSIS — I428 Other cardiomyopathies: Secondary | ICD-10-CM | POA: Insufficient documentation

## 2019-09-05 DIAGNOSIS — I4891 Unspecified atrial fibrillation: Secondary | ICD-10-CM | POA: Diagnosis not present

## 2019-09-05 DIAGNOSIS — I739 Peripheral vascular disease, unspecified: Secondary | ICD-10-CM | POA: Insufficient documentation

## 2019-09-05 HISTORY — DX: Personal history of other medical treatment: Z92.89

## 2019-09-05 HISTORY — DX: Unspecified osteoarthritis, unspecified site: M19.90

## 2019-09-05 HISTORY — DX: Peripheral vascular disease, unspecified: I73.9

## 2019-09-05 HISTORY — DX: Other complications of anesthesia, initial encounter: T88.59XA

## 2019-09-05 LAB — COMPREHENSIVE METABOLIC PANEL
ALT: 16 U/L (ref 0–44)
AST: 17 U/L (ref 15–41)
Albumin: 4.1 g/dL (ref 3.5–5.0)
Alkaline Phosphatase: 56 U/L (ref 38–126)
Anion gap: 9 (ref 5–15)
BUN: 19 mg/dL (ref 6–20)
CO2: 22 mmol/L (ref 22–32)
Calcium: 9.8 mg/dL (ref 8.9–10.3)
Chloride: 108 mmol/L (ref 98–111)
Creatinine, Ser: 0.85 mg/dL (ref 0.61–1.24)
GFR calc Af Amer: >60 mL/min (ref >60)
GFR calc non Af Amer: >60 mL/min (ref >60)
Glucose, Bld: 111 mg/dL — ABNORMAL HIGH (ref 70–99)
Potassium: 4.6 mmol/L (ref 3.5–5.1)
Sodium: 139 mmol/L (ref 135–145)
Total Bilirubin: 0.6 mg/dL (ref 0.3–1.2)
Total Protein: 6.9 g/dL (ref 6.5–8.1)

## 2019-09-05 LAB — SURGICAL PCR SCREEN
MRSA, PCR: NEGATIVE
Staphylococcus aureus: NEGATIVE

## 2019-09-05 LAB — CBC
HCT: 44.6 % (ref 39.0–52.0)
Hemoglobin: 14.7 g/dL (ref 13.0–17.0)
MCH: 30.9 pg (ref 26.0–34.0)
MCHC: 33 g/dL (ref 30.0–36.0)
MCV: 93.9 fL (ref 80.0–100.0)
Platelets: 297 10*3/uL (ref 150–400)
RBC: 4.75 MIL/uL (ref 4.22–5.81)
RDW: 13 % (ref 11.5–15.5)
WBC: 11.1 10*3/uL — ABNORMAL HIGH (ref 4.0–10.5)
nRBC: 0 % (ref 0.0–0.2)

## 2019-09-05 LAB — URINALYSIS, ROUTINE W REFLEX MICROSCOPIC
Bilirubin Urine: NEGATIVE
Glucose, UA: NEGATIVE mg/dL
Hgb urine dipstick: NEGATIVE
Ketones, ur: NEGATIVE mg/dL
Leukocytes,Ua: NEGATIVE
Nitrite: NEGATIVE
Protein, ur: NEGATIVE mg/dL
Specific Gravity, Urine: 1.016 (ref 1.005–1.030)
pH: 6 (ref 5.0–8.0)

## 2019-09-05 LAB — PROTIME-INR
INR: 1.1 (ref 0.8–1.2)
Prothrombin Time: 13.7 seconds (ref 11.4–15.2)

## 2019-09-05 LAB — TYPE AND SCREEN
ABO/RH(D): O POS
Antibody Screen: NEGATIVE

## 2019-09-05 LAB — APTT: aPTT: 37 seconds — ABNORMAL HIGH (ref 24–36)

## 2019-09-05 LAB — HEMOGLOBIN A1C
Hgb A1c MFr Bld: 6.4 % — ABNORMAL HIGH (ref 4.8–5.6)
Mean Plasma Glucose: 136.98 mg/dL

## 2019-09-05 LAB — GLUCOSE, CAPILLARY: Glucose-Capillary: 147 mg/dL — ABNORMAL HIGH (ref 70–99)

## 2019-09-05 NOTE — Progress Notes (Addendum)
PCP - Dr. Jacelyn Grip  Cardiologist - Dr Forrestine Him  Chest x-ray - na  EKG - 09/05/2019  Stress Test - no  ECHO - 2016  Cardiac Cath - 07/18/2018   Sleep Study - no  CPAP - na  LABS- CBC< CMP, PT, PTT, T/S, A1C, UA PCR  ASA- continue - instructed by MD  ERAS-no  HA1C-6.4 Fasting Blood Sugar - checks 2 hours afte\er eating in am. Checks Blood Sugar __1___ times a day  Anesthesia-   Pt denies having chest pain, sob, or fever at this time. All instructions explained to the pt, with a verbal understanding of the material. Pt agrees to go over the instructions while at home for a better understanding. Pt also instructed to self quarantine after being tested for COVID-19. The opportunity to ask questions was provided.

## 2019-09-05 NOTE — Progress Notes (Addendum)
Your procedure is scheduled on Wednesday 09/11/2019.  Report to [facility] Main Entrance "A" at 05:30 A.M., and check in at the Admitting office.  Call this number if you have problems the morning of surgery:  [phone]   Call [phone] if you have any questions prior to your surgery date Monday-Friday 8am-4pm    Remember:  Do not eat or drink after midnight the night before your surgery   Take these medicines the morning of surgery with A SIP OF WATER: Aspirin Acetaminophen (Tylenol) - if needed  7 days prior to surgery STOP taking any Aleve, Naproxen, Ibuprofen, Motrin, Advil, Goody's, BC's, all herbal medications, fish oil, and all vitamins.   WHAT DO I DO ABOUT MY DIABETES MEDICATION?  Marland Kitchen Do not take oral diabetes medicines (pills) the morning of surgery. - Do NOT take Metformin (Glucophage) the morning of surgery.   HOW TO MANAGE YOUR DIABETES BEFORE AND AFTER SURGERY  Why is it important to control my blood sugar before and after surgery? . Improving blood sugar levels before and after surgery helps healing and can limit problems. . A way of improving blood sugar control is eating a healthy diet by: o  Eating less sugar and carbohydrates o  Increasing activity/exercise o  Talking with your doctor about reaching your blood sugar goals . High blood sugars (greater than 180 mg/dL) can raise your risk of infections and slow your recovery, so you will need to focus on controlling your diabetes during the weeks before surgery. . Make sure that the doctor who takes care of your diabetes knows about your planned surgery including the date and location.  How do I manage my blood sugar before surgery? . Check your blood sugar at least 4 times a day, starting 2 days before surgery, to make sure that the level is not too high or low. . Check your blood sugar the morning of your surgery when you wake up and every 2 hours until you get to the Short Stay unit. o If your blood  sugar is less than 70 mg/dL, you will need to treat for low blood sugar: - Do not take insulin. - Treat a low blood sugar (less than 70 mg/dL) with  cup of clear juice (cranberry or apple), 4 glucose tablets, OR glucose gel. - Recheck blood sugar in 15 minutes after treatment (to make sure it is greater than 70 mg/dL). If your blood sugar is not greater than 70 mg/dL on recheck, call [phone] for further instructions. . Report your blood sugar to the short stay nurse when you get to Short Stay.  . If you are admitted to the hospital after surgery: o Your blood sugar will be checked by the staff and you will probably be given insulin after surgery (instead of oral diabetes medicines) to make sure you have good blood sugar levels. o The goal for blood sugar control after surgery is 80-180 mg/dL.     The Morning of Surgery  Do not wear jewelry.  Do not wear lotions, powders, colognes, or deodorant  Men may shave face and neck.  Do not bring valuables to the hospital.  [facility] is not responsible for any belongings or valuables.  If you are a smoker, DO NOT Smoke 24 hours prior to surgery  If you wear a CPAP at night please bring your mask, tubing, and machine the morning of surgery   Remember that you must have someone to transport you home after your  surgery, and remain with you for 24 hours if you are discharged the same day.   Please bring cases for contacts, glasses, hearing aids, dentures or bridgework because it cannot be worn into surgery.    Leave your suitcase in the car.  After surgery it may be brought to your room.  For patients admitted to the hospital, discharge time will be determined by your treatment team.  Patients discharged the day of surgery will not be allowed to drive home.    Special instructions:   [facility]- Preparing For Surgery  Before surgery, you can play an important role. Because skin is not sterile, your skin needs to be as free of germs  as possible. You can reduce the number of germs on your skin by washing with CHG (chlorahexidine gluconate) Soap before surgery.  CHG is an antiseptic cleaner which kills germs and bonds with the skin to continue killing germs even after washing.    Oral Hygiene is also important to reduce your risk of infection.  Remember - BRUSH YOUR TEETH THE MORNING OF SURGERY WITH YOUR REGULAR TOOTHPASTE  Please do not use if you have an allergy to CHG or antibacterial soaps. If your skin becomes reddened/irritated stop using the CHG.  Do not shave (including legs and underarms) for at least 48 hours prior to first CHG shower. It is OK to shave your face.  Please follow these instructions carefully.   1. Shower the NIGHT BEFORE SURGERY and the MORNING OF SURGERY with CHG Soap.   2. If you chose to wash your hair, wash your hair first as usual with your normal shampoo.  3. After you shampoo,wash your face and private area with the soap you use at home, then rinse your hair and body thoroughly to remove the shampoo and soap.  4. Use CHG as you would any other liquid soap. You can apply CHG directly to the skin and wash gently with a scrungie or a clean washcloth.   5. Apply the CHG Soap to your body ONLY FROM THE NECK DOWN.  Do not use on open wounds or open sores. Avoid contact with your eyes, ears, mouth and genitals (private parts).  6. Wash thoroughly, paying special attention to the area where your surgery will be performed.  7. Thoroughly rinse your body with warm water from the neck down.  8. DO NOT shower/wash with your normal soap after using and rinsing off the CHG Soap.  9. Pat yourself dry with a CLEAN TOWEL.  10. Wear CLEAN PAJAMAS to bed the night before surgery, wear comfortable clothes the morning of surgery  11. Place CLEAN SHEETS on your bed the night of your first shower and DO NOT SLEEP WITH PETS.   Day of Surgery:  Please shower the morning of surgery with the CHG soap Do  not apply any deodorants/lotions. Please wear clean clothes to the hospital/surgery center.   Remember to brush your teeth WITH YOUR REGULAR TOOTHPASTE.   Please read over the following fact sheets that you were given.

## 2019-09-06 NOTE — Progress Notes (Signed)
Anesthesia Chart Review:  Case: [id number] Date/Time: 09/11/19 0715   Procedure: ENDARTERECTOMY RIGHT  CAROTID (Right )   Anesthesia type: General   Pre-op diagnosis: RIGHT CAROTID ARTERY STENOSIS   Location: [facility] ROOM 16 / [facility]   Surgeons: Nada Libman, MD      DISCUSSION:Patient is a 58 year old male scheduled for the above procedure. He has asymptomatic 80-99% RICA stenosis.   History includes former smoker (quit 06/25/15), non-ischemic cardiomyopathy (with cardiogenic shock diagnosed 06/2015, minimal CAD by LHC, required short-term milrinone gtt), CHF (EF 50-55% 06/2018), afib (06/2015, chemically converted to SR, refused DOAC and opted to stop warfarin following normal 2018 event monitor), HTN, PVD (declines statin), DM2, diverticulitis (s/p open sigmoid colectomy for recurrent diverticulitis, procedure also included appendectomy and ureteral stents by urology 11/26/10). BMI is consistent with obesity.    Last evaluation with Dr. Gala Romney was 07/18/18. He was doing well then. Had changed his diet and lost 40 lbs and staying active. Was cleared (mild to moderate risk) to undergo hernia repair surgery from a CV standpoint at that time, although unsure if patient every had the procedure done. EF 50-55% at that time by echo. 30% LAD and CX disease in 2016. He denied chest pain and SOB at PAT RN visit. Per Dr. Estanislado Spire note, patient is very active at work and consistently walks 15-19K steps.   Preoperative COVID-19 test is scheduled for 09/09/19. If negative and otherwise no acute changes, I would anticipate that he can proceed as planned.  He is continuing aspirin.   VS: BP 130/70   Pulse 84   Temp 36.8 C (Oral)   Resp 19   Ht 5\' 5"  (1.651 m)   Wt 89.5 kg   SpO2 96%   BMI 32.83 kg/m    PROVIDERS: Ileana Ladd, MD is PCP  Arvilla Meres, MD is HF cardiologist   LABS: Labs reviewed: Acceptable for surgery. (all labs ordered are listed, but only abnormal results are  displayed)  Labs Reviewed  GLUCOSE, CAPILLARY - Abnormal; Notable for the following components:      Result Value   Glucose-Capillary 147 (*)    All other components within normal limits  APTT - Abnormal; Notable for the following components:   aPTT 37 (*)    All other components within normal limits  CBC - Abnormal; Notable for the following components:   WBC 11.1 (*)    All other components within normal limits  COMPREHENSIVE METABOLIC PANEL - Abnormal; Notable for the following components:   Glucose, Bld 111 (*)    All other components within normal limits  URINALYSIS, ROUTINE W REFLEX MICROSCOPIC - Abnormal; Notable for the following components:   Color, Urine AMBER (*)    APPearance CLOUDY (*)    All other components within normal limits  HEMOGLOBIN A1C - Abnormal; Notable for the following components:   Hgb A1c MFr Bld 6.4 (*)    All other components within normal limits  SURGICAL PCR SCREEN  PROTIME-INR  TYPE AND SCREEN     IMAGES: US soft tissue neck 07/11/19: IMPRESSION: 1. Critical stenosis in the right internal carotid artery. Estimated degree of stenosis is greater than 70%. Recommend a dedicated carotid artery duplex. 2. Nonspecific findings at the palpable area. No discrete solid or cystic lesion. Cannot exclude a small lymph node in this area.   EKG: 09/05/19: NSR   CV: Carotid US 09/02/19: Summary: - Right Carotid: Velocities in the right ICA are consistent with a  80-99%                stenosis. - Left Carotid: Velocities in the left ICA are consistent with a 1-39% stenosis,               the bifurcation is low in the neck.   Echo 07/18/18: Study Conclusions - Procedure narrative: Transthoracic echocardiography. Image   quality was poor. The study was technically difficult, as a   result of poor acoustic windows, poor sound wave transmission,   and body habitus. - Left ventricle: The cavity size was normal. Wall thickness was   normal.  Systolic function was normal. The estimated ejection   fraction was in the range of 50% to 55%. Left ventricular   diastolic function parameters were normal. - Aortic valve: Sclerosis without stenosis. There was mild   regurgitation. - Right ventricle: The cavity size was mildly dilated. - Atrial septum: No defect or patent foramen ovale was identified. (Comparison LVEF: 40-45% 10/18/16; 25-30% 04/13/16 & 10/13/15; 15-20% 06/26/15)   Cardiac event monitor 10/12/16-11/10/16: Study Highlights: NSR. No atrial fibrillation detected.   MRI Cardiac 06/30/15: IMPRESSION: 1) Severe LVE with diffuse hypokinesis EF 19% 2) Small area of mid myocardial hyperenhancement in the mid and basal anterior septum. No evidence of ischemic cardiomyopathy 3) Severe LAE 4) Moderate appearing MR 5) Mild RAE 6) Trivial pericardial effusion   RHC/LHC 06/29/15:  Prox LAD lesion, 30% stenosed.  Prox Cx lesion, 30% stenosed.  There is severe left ventricular systolic dysfunction.  Findings: RA = 10 RV =  39/11/15 PA =  42/19 (33) PCW = 27 Fick cardiac output/index = 2.4/1.2 PVR = 2.5 WU SVR = 2560  FA sat = 97% PA sat = 45%, 47% Ao = 108/74 (88) LV = 101/16/25  Abdominal aortogram  --Renal arteries widely patent --Mild aortic & iliac plaquing  Assessment: 1. Mild non-obstructive CAD with small coronaries 2. Severe NICM. EF 10% 3. Elevated filling pressures with severely depressed cardiac output   Past Medical History:  Diagnosis Date  . A-fib ([place]) 2013   chemically converted  . Arthritis    "here and there" -  . CHF (congestive heart failure) ([place])   . Complication of anesthesia    Slow to awaken with colon surgery- early 2000  . Diabetes mellitus without complication ([place])    type II  . Diverticulitis    ACTIVE WITH ABCESS  . History of blood transfusion    2000ish diverticulitis  . Hypertension   . Peripheral vascular disease ([place])   . Pneumonia 2010    Past Surgical  History:  Procedure Laterality Date  . CARDIAC CATHETERIZATION N/A 06/29/2015   Procedure: Right/Left Heart Cath and Coronary Angiography;  Surgeon: Jolaine Artist, MD;  Location: [place] [facility];  Service: Cardiovascular;  Laterality: N/A;  . COLON SURGERY  2010  . COLONOSCOPY    . HERNIA REPAIR  38 OR 58 YEARS OLD   inguinial  . TONSILLECTOMY  58 YEARS OLD    MEDICATIONS: . acetaminophen (TYLENOL) 500 MG tablet  . aspirin 81 MG tablet  . BLACK ELDERBERRY,BERRY-FLOWER, PO  . cholecalciferol (VITAMIN D) 25 MCG (1000 UT) tablet  . Coenzyme Q10 200 MG TABS  . Cyanocobalamin (VITAMIN B-12) 5000 MCG SUBL  . ENTRESTO 97-103 MG  . furosemide (LASIX) 40 MG tablet  . Krill Oil 500 MG CAPS  . metFORMIN (GLUCOPHAGE) 500 MG tablet  . metoprolol succinate (TOPROL-XL) 50 MG 24 hr tablet  . Multiple  Vitamins-Minerals (MULTIVITAL PO)  . Probiotic Product (PROBIOTIC DAILY PO)   No current facility-administered medications for this encounter.    Shonna Chock, PA-C Surgical Short Stay/Anesthesiology [facility] Phone [phone] [facility] Phone [phone] 09/06/2019 4:54 PM

## 2019-09-06 NOTE — Anesthesia Preprocedure Evaluation (Addendum)
Anesthesia Evaluation  Patient identified by MRN, date of birth, ID band Patient awake    Reviewed: Allergy & Precautions, NPO status , Patient's Chart, lab work & pertinent test results  Airway Mallampati: II  TM Distance: >3 FB     Dental  (+) Dental Advisory Given   Pulmonary former smoker,    breath sounds clear to auscultation       Cardiovascular hypertension, Pt. on medications and Pt. on home beta blockers + CAD, + Peripheral Vascular Disease and +CHF   Rhythm:Regular Rate:Normal     Neuro/Psych negative neurological ROS     GI/Hepatic negative GI ROS, Neg liver ROS,   Endo/Other  diabetes, Type 2, Oral Hypoglycemic Agents  Renal/GU negative Renal ROS     Musculoskeletal   Abdominal   Peds  Hematology negative hematology ROS (+)   Anesthesia Other Findings   Reproductive/Obstetrics                           Lab Results  Component Value Date   WBC 11.1 (H) 09/05/2019   HGB 14.7 09/05/2019   HCT 44.6 09/05/2019   MCV 93.9 09/05/2019   PLT 297 09/05/2019   Lab Results  Component Value Date   CREATININE 0.85 09/05/2019   BUN 19 09/05/2019   NA 139 09/05/2019   K 4.6 09/05/2019   CL 108 09/05/2019   CO2 22 09/05/2019    Anesthesia Physical Anesthesia Plan  ASA: III  Anesthesia Plan: General   Post-op Pain Management:    Induction: Intravenous  PONV Risk Score and Plan: 2 and Dexamethasone, Ondansetron and Treatment may vary due to age or medical condition  Airway Management Planned: Oral ETT  Additional Equipment: Arterial line  Intra-op Plan:   Post-operative Plan: Extubation in OR  Informed Consent: I have reviewed the patients History and Physical, chart, labs and discussed the procedure including the risks, benefits and alternatives for the proposed anesthesia with the patient or authorized representative who has indicated his/her understanding and  acceptance.     Dental advisory given  Plan Discussed with: CRNA  Anesthesia Plan Comments: (Remifentanil gtt. GETA )      Anesthesia Quick Evaluation

## 2019-09-09 ENCOUNTER — Other Ambulatory Visit (HOSPITAL_COMMUNITY)
Admission: RE | Admit: 2019-09-09 | Discharge: 2019-09-09 | Disposition: A | Discharge status: Home / Self Care | Payer: BC Managed Care – PPO | Source: Ambulatory Visit | Attending: Surgery | Admitting: Surgery

## 2019-09-09 DIAGNOSIS — Z20828 Contact with and (suspected) exposure to other viral communicable diseases: Secondary | ICD-10-CM | POA: Diagnosis present

## 2019-09-09 LAB — SARS CORONAVIRUS 2 (TAT 6-24 HRS): SARS Coronavirus 2: NEGATIVE

## 2019-09-11 ENCOUNTER — Encounter (HOSPITAL_COMMUNITY)
Admission: RE | Disposition: A | Discharge status: Home / Self Care | Payer: Self-pay | Source: Home / Self Care | Attending: Surgery

## 2019-09-11 ENCOUNTER — Inpatient Hospital Stay (HOSPITAL_COMMUNITY): Payer: BC Managed Care – PPO | Admitting: Anesthesiology

## 2019-09-11 ENCOUNTER — Inpatient Hospital Stay (HOSPITAL_COMMUNITY): Payer: BC Managed Care – PPO | Admitting: Vascular Surgery

## 2019-09-11 ENCOUNTER — Inpatient Hospital Stay (HOSPITAL_COMMUNITY)
Admission: RE | Admit: 2019-09-11 | Discharge: 2019-09-12 | DRG: 038 | Disposition: A | Discharge status: Home / Self Care | Payer: BC Managed Care – PPO | Attending: Surgery | Admitting: Surgery

## 2019-09-11 ENCOUNTER — Encounter (HOSPITAL_COMMUNITY): Payer: Self-pay | Admitting: Surgery

## 2019-09-11 ENCOUNTER — Other Ambulatory Visit: Payer: Self-pay

## 2019-09-11 DIAGNOSIS — Z7984 Long term (current) use of oral hypoglycemic drugs: Secondary | ICD-10-CM

## 2019-09-11 DIAGNOSIS — Z87891 Personal history of nicotine dependence: Secondary | ICD-10-CM | POA: Diagnosis not present

## 2019-09-11 DIAGNOSIS — Z79899 Other long term (current) drug therapy: Secondary | ICD-10-CM | POA: Diagnosis not present

## 2019-09-11 DIAGNOSIS — I4891 Unspecified atrial fibrillation: Secondary | ICD-10-CM | POA: Diagnosis present

## 2019-09-11 DIAGNOSIS — I6521 Occlusion and stenosis of right carotid artery: Secondary | ICD-10-CM | POA: Diagnosis present

## 2019-09-11 DIAGNOSIS — Z7982 Long term (current) use of aspirin: Secondary | ICD-10-CM | POA: Diagnosis not present

## 2019-09-11 DIAGNOSIS — E785 Hyperlipidemia, unspecified: Secondary | ICD-10-CM | POA: Diagnosis present

## 2019-09-11 DIAGNOSIS — I110 Hypertensive heart disease with heart failure: Secondary | ICD-10-CM | POA: Diagnosis present

## 2019-09-11 DIAGNOSIS — I5022 Chronic systolic (congestive) heart failure: Secondary | ICD-10-CM | POA: Diagnosis present

## 2019-09-11 DIAGNOSIS — M1990 Unspecified osteoarthritis, unspecified site: Secondary | ICD-10-CM | POA: Diagnosis present

## 2019-09-11 DIAGNOSIS — Z8249 Family history of ischemic heart disease and other diseases of the circulatory system: Secondary | ICD-10-CM

## 2019-09-11 DIAGNOSIS — Z888 Allergy status to other drugs, medicaments and biological substances status: Secondary | ICD-10-CM | POA: Diagnosis not present

## 2019-09-11 DIAGNOSIS — E1151 Type 2 diabetes mellitus with diabetic peripheral angiopathy without gangrene: Secondary | ICD-10-CM | POA: Diagnosis present

## 2019-09-11 HISTORY — PX: ENDARTERECTOMY: SHX5162

## 2019-09-11 HISTORY — PX: PATCH ANGIOPLASTY: SHX6230

## 2019-09-11 LAB — GLUCOSE, CAPILLARY
Glucose-Capillary: 113 mg/dL — ABNORMAL HIGH (ref 70–99)
Glucose-Capillary: 122 mg/dL — ABNORMAL HIGH (ref 70–99)
Glucose-Capillary: 88 mg/dL (ref 70–99)
Glucose-Capillary: 108 mg/dL — ABNORMAL HIGH (ref 70–99)

## 2019-09-11 LAB — POCT ACTIVATED CLOTTING TIME: Activated Clotting Time: 285 seconds

## 2019-09-11 SURGERY — ENDARTERECTOMY, CAROTID
Anesthesia: General | Site: Neck | Laterality: Right

## 2019-09-11 MED ORDER — ACETAMINOPHEN 325 MG RE SUPP: 325.0000 mg | RECTAL | Status: DC | PRN

## 2019-09-11 MED ORDER — LIDOCAINE 2% (20 MG/ML) 5 ML SYRINGE
INTRAMUSCULAR | Status: AC
  Filled 2019-09-11: qty 5

## 2019-09-11 MED ORDER — ROCURONIUM BROMIDE 50 MG/5ML IV SOSY
PREFILLED_SYRINGE | INTRAVENOUS | Status: DC | PRN
  Administered 2019-09-11: 50 mg via INTRAVENOUS
  Administered 2019-09-11: 10 mg via INTRAVENOUS

## 2019-09-11 MED ORDER — ALUM & MAG HYDROXIDE-SIMETH 200-200-20 MG/5ML PO SUSP: 15.0000 mL | ORAL | Status: DC | PRN

## 2019-09-11 MED ORDER — METFORMIN HCL 500 MG PO TABS
500.0000 mg | ORAL_TABLET | Freq: Every day | ORAL | Status: DC
  Administered 2019-09-12: 09:00:00 500 mg via ORAL
  Filled 2019-09-11: qty 1

## 2019-09-11 MED ORDER — RISAQUAD PO CAPS
1.0000 | ORAL_CAPSULE | Freq: Every day | ORAL | Status: DC
  Administered 2019-09-12: 1 via ORAL
  Filled 2019-09-11: qty 1

## 2019-09-11 MED ORDER — GLYCOPYRROLATE 0.2 MG/ML IJ SOLN
INTRAMUSCULAR | Status: DC | PRN
  Administered 2019-09-11: .2 mg via INTRAVENOUS

## 2019-09-11 MED ORDER — PHENYLEPHRINE HCL-NACL 10-0.9 MG/250ML-% IV SOLN
25.0000 ug/min | INTRAVENOUS | Status: DC
  Administered 2019-09-11: 50 ug/min via INTRAVENOUS
  Administered 2019-09-11: 21:00:00 55 ug/min via INTRAVENOUS
  Administered 2019-09-11: 40 ug/min via INTRAVENOUS
  Administered 2019-09-12: 03:00:00 45 ug/min via INTRAVENOUS
  Filled 2019-09-11 (×5): qty 250

## 2019-09-11 MED ORDER — GLYCOPYRROLATE PF 0.2 MG/ML IJ SOSY
PREFILLED_SYRINGE | INTRAMUSCULAR | Status: AC
  Filled 2019-09-11: qty 1

## 2019-09-11 MED ORDER — PHENYLEPHRINE 40 MCG/ML (10ML) SYRINGE FOR IV PUSH (FOR BLOOD PRESSURE SUPPORT)
PREFILLED_SYRINGE | INTRAVENOUS | Status: AC
  Filled 2019-09-11: qty 10

## 2019-09-11 MED ORDER — PROTAMINE SULFATE 10 MG/ML IV SOLN
INTRAVENOUS | Status: AC
  Filled 2019-09-11: qty 5

## 2019-09-11 MED ORDER — MIDAZOLAM HCL 2 MG/2ML IJ SOLN
INTRAMUSCULAR | Status: AC
  Filled 2019-09-11: qty 2

## 2019-09-11 MED ORDER — SODIUM CHLORIDE 0.9 % IV SOLN: INTRAVENOUS | Status: DC | PRN

## 2019-09-11 MED ORDER — SODIUM CHLORIDE 0.9 % IV SOLN
0.0125 ug/kg/min | INTRAVENOUS | Status: DC
  Filled 2019-09-11: qty 2000

## 2019-09-11 MED ORDER — LACTATED RINGERS IV SOLN: INTRAVENOUS | Status: DC | PRN

## 2019-09-11 MED ORDER — MAGNESIUM SULFATE 2 GM/50ML IV SOLN: 2.0000 g | Freq: Every day | INTRAVENOUS | Status: DC | PRN

## 2019-09-11 MED ORDER — LIDOCAINE HCL (CARDIAC) PF 100 MG/5ML IV SOSY
PREFILLED_SYRINGE | INTRAVENOUS | Status: DC | PRN
  Administered 2019-09-11: 30 mg via INTRAVENOUS

## 2019-09-11 MED ORDER — CEFAZOLIN SODIUM-DEXTROSE 2-4 GM/100ML-% IV SOLN
2.0000 g | Freq: Three times a day (TID) | INTRAVENOUS | Status: AC
  Administered 2019-09-11 – 2019-09-12 (×2): 2 g via INTRAVENOUS
  Filled 2019-09-11 (×2): qty 100

## 2019-09-11 MED ORDER — ROCURONIUM BROMIDE 100 MG/10ML IV SOLN: INTRAVENOUS | Status: DC | PRN

## 2019-09-11 MED ORDER — REMIFENTANIL HCL 2 MG IV SOLR
INTRAVENOUS | Status: DC | PRN
  Administered 2019-09-11: .05 ug/kg/min via INTRAVENOUS

## 2019-09-11 MED ORDER — PANTOPRAZOLE SODIUM 40 MG PO TBEC
40.0000 mg | DELAYED_RELEASE_TABLET | Freq: Every day | ORAL | Status: DC
  Administered 2019-09-11 – 2019-09-12 (×2): 40 mg via ORAL
  Filled 2019-09-11 (×2): qty 1

## 2019-09-11 MED ORDER — EPHEDRINE 5 MG/ML INJ
INTRAVENOUS | Status: AC
  Filled 2019-09-11: qty 10

## 2019-09-11 MED ORDER — SODIUM CHLORIDE 0.9 % IV SOLN: INTRAVENOUS | Status: DC

## 2019-09-11 MED ORDER — HEPARIN SODIUM (PORCINE) 1000 UNIT/ML IJ SOLN
INTRAMUSCULAR | Status: DC | PRN
  Administered 2019-09-11: 9000 [IU] via INTRAVENOUS

## 2019-09-11 MED ORDER — ROCURONIUM BROMIDE 10 MG/ML (PF) SYRINGE
PREFILLED_SYRINGE | INTRAVENOUS | Status: AC
  Filled 2019-09-11: qty 20

## 2019-09-11 MED ORDER — OXYCODONE-ACETAMINOPHEN 5-325 MG PO TABS: 1.0000 | ORAL_TABLET | ORAL | Status: DC | PRN

## 2019-09-11 MED ORDER — MIDAZOLAM HCL 5 MG/5ML IJ SOLN
INTRAMUSCULAR | Status: DC | PRN
  Administered 2019-09-11: 1 mg via INTRAVENOUS

## 2019-09-11 MED ORDER — ALBUMIN HUMAN 5 % IV SOLN
12.5000 g | Freq: Once | INTRAVENOUS | Status: AC
  Administered 2019-09-11: 11:00:00 12.5 g via INTRAVENOUS

## 2019-09-11 MED ORDER — ONDANSETRON HCL 4 MG/2ML IJ SOLN
INTRAMUSCULAR | Status: AC
  Filled 2019-09-11: qty 2

## 2019-09-11 MED ORDER — PROPOFOL 10 MG/ML IV BOLUS
INTRAVENOUS | Status: AC
  Filled 2019-09-11: qty 40

## 2019-09-11 MED ORDER — CHLORHEXIDINE GLUCONATE 4 % EX LIQD: 60.0000 mL | Freq: Once | CUTANEOUS | Status: DC

## 2019-09-11 MED ORDER — CEFAZOLIN SODIUM-DEXTROSE 2-4 GM/100ML-% IV SOLN
INTRAVENOUS | Status: AC
  Filled 2019-09-11: qty 100

## 2019-09-11 MED ORDER — 0.9 % SODIUM CHLORIDE (POUR BTL) OPTIME
TOPICAL | Status: DC | PRN
  Administered 2019-09-11: 3000 mL

## 2019-09-11 MED ORDER — HEPARIN SODIUM (PORCINE) 1000 UNIT/ML IJ SOLN
INTRAMUSCULAR | Status: AC
  Filled 2019-09-11: qty 1

## 2019-09-11 MED ORDER — MORPHINE SULFATE (PF) 2 MG/ML IV SOLN: 2.0000 mg | INTRAVENOUS | Status: DC | PRN

## 2019-09-11 MED ORDER — PHENYLEPHRINE HCL-NACL 10-0.9 MG/250ML-% IV SOLN
INTRAVENOUS | Status: DC | PRN
  Administered 2019-09-11: 100 ug/min via INTRAVENOUS

## 2019-09-11 MED ORDER — CEFAZOLIN SODIUM-DEXTROSE 2-4 GM/100ML-% IV SOLN
2.0000 g | INTRAVENOUS | Status: AC
  Administered 2019-09-11: 08:00:00 2 g via INTRAVENOUS

## 2019-09-11 MED ORDER — POLYETHYLENE GLYCOL 3350 17 G PO PACK: 17.0000 g | PACK | Freq: Every day | ORAL | Status: DC | PRN

## 2019-09-11 MED ORDER — PHENOL 1.4 % MT LIQD: 1.0000 | OROMUCOSAL | Status: DC | PRN

## 2019-09-11 MED ORDER — ONDANSETRON HCL 4 MG/2ML IJ SOLN: 4.0000 mg | Freq: Four times a day (QID) | INTRAMUSCULAR | Status: DC | PRN

## 2019-09-11 MED ORDER — LIDOCAINE HCL (PF) 1 % IJ SOLN
INTRAMUSCULAR | Status: AC
  Filled 2019-09-11: qty 30

## 2019-09-11 MED ORDER — ALBUMIN HUMAN 5 % IV SOLN
12.5000 g | Freq: Once | INTRAVENOUS | Status: AC
  Administered 2019-09-11: 12.5 g via INTRAVENOUS

## 2019-09-11 MED ORDER — SODIUM CHLORIDE 0.9 % IV SOLN
INTRAVENOUS | Status: AC
  Filled 2019-09-11: qty 1.2

## 2019-09-11 MED ORDER — PROTAMINE SULFATE 10 MG/ML IV SOLN
INTRAVENOUS | Status: DC | PRN
  Administered 2019-09-11 (×3): 10 mg via INTRAVENOUS

## 2019-09-11 MED ORDER — ALBUMIN HUMAN 5 % IV SOLN
INTRAVENOUS | Status: AC
  Filled 2019-09-11: qty 250

## 2019-09-11 MED ORDER — FENTANYL CITRATE (PF) 100 MCG/2ML IJ SOLN: 25.0000 ug | INTRAMUSCULAR | Status: DC | PRN

## 2019-09-11 MED ORDER — FENTANYL CITRATE (PF) 100 MCG/2ML IJ SOLN
INTRAMUSCULAR | Status: DC | PRN
  Administered 2019-09-11: 100 ug via INTRAVENOUS

## 2019-09-11 MED ORDER — GUAIFENESIN-DM 100-10 MG/5ML PO SYRP: 15.0000 mL | ORAL_SOLUTION | ORAL | Status: DC | PRN

## 2019-09-11 MED ORDER — FUROSEMIDE 40 MG PO TABS: 40.0000 mg | ORAL_TABLET | ORAL | Status: DC | PRN

## 2019-09-11 MED ORDER — SODIUM CHLORIDE 0.9 % IV SOLN: 500.0000 mL | Freq: Once | INTRAVENOUS | Status: DC | PRN

## 2019-09-11 MED ORDER — ACETAMINOPHEN 325 MG PO TABS
325.0000 mg | ORAL_TABLET | ORAL | Status: DC | PRN
  Administered 2019-09-11 – 2019-09-12 (×3): 650 mg via ORAL
  Filled 2019-09-11 (×3): qty 2

## 2019-09-11 MED ORDER — PROPOFOL 10 MG/ML IV BOLUS
INTRAVENOUS | Status: DC | PRN
  Administered 2019-09-11: 170 mg via INTRAVENOUS

## 2019-09-11 MED ORDER — HEMOSTATIC AGENTS (NO CHARGE) OPTIME
TOPICAL | Status: DC | PRN
  Administered 2019-09-11: 1 via TOPICAL

## 2019-09-11 MED ORDER — BISACODYL 10 MG RE SUPP: 10.0000 mg | Freq: Every day | RECTAL | Status: DC | PRN

## 2019-09-11 MED ORDER — INSULIN ASPART 100 UNIT/ML ~~LOC~~ SOLN: 0.0000 [IU] | Freq: Three times a day (TID) | SUBCUTANEOUS | Status: DC

## 2019-09-11 MED ORDER — ONDANSETRON HCL 4 MG/2ML IJ SOLN: 4.0000 mg | Freq: Once | INTRAMUSCULAR | Status: DC | PRN

## 2019-09-11 MED ORDER — PHENYLEPHRINE HCL (PRESSORS) 10 MG/ML IV SOLN
INTRAVENOUS | Status: DC | PRN
  Administered 2019-09-11: 80 ug via INTRAVENOUS
  Administered 2019-09-11: 120 ug via INTRAVENOUS
  Administered 2019-09-11: 100 ug via INTRAVENOUS
  Administered 2019-09-11: 80 ug via INTRAVENOUS

## 2019-09-11 MED ORDER — PROBIOTIC DAILY PO CAPS: ORAL_CAPSULE | Freq: Every day | ORAL | Status: DC

## 2019-09-11 MED ORDER — DOCUSATE SODIUM 100 MG PO CAPS
100.0000 mg | ORAL_CAPSULE | Freq: Every day | ORAL | Status: DC
  Filled 2019-09-11: qty 1

## 2019-09-11 MED ORDER — FENTANYL CITRATE (PF) 250 MCG/5ML IJ SOLN
INTRAMUSCULAR | Status: AC
  Filled 2019-09-11: qty 5

## 2019-09-11 MED ORDER — ASPIRIN EC 81 MG PO TBEC
81.0000 mg | DELAYED_RELEASE_TABLET | Freq: Every day | ORAL | Status: DC
  Administered 2019-09-12: 09:00:00 81 mg via ORAL
  Filled 2019-09-11: qty 1

## 2019-09-11 MED ORDER — POTASSIUM CHLORIDE CRYS ER 20 MEQ PO TBCR: 20.0000 meq | EXTENDED_RELEASE_TABLET | Freq: Every day | ORAL | Status: DC | PRN

## 2019-09-11 MED ORDER — SACUBITRIL-VALSARTAN 97-103 MG PO TABS
1.0000 | ORAL_TABLET | Freq: Two times a day (BID) | ORAL | Status: DC
  Administered 2019-09-12: 10:00:00 1 via ORAL
  Filled 2019-09-11 (×4): qty 1

## 2019-09-11 MED ORDER — PHENYLEPHRINE HCL-NACL 10-0.9 MG/250ML-% IV SOLN
0.0000 ug/min | INTRAVENOUS | Status: DC
  Administered 2019-09-11: 12:00:00 60 ug/min via INTRAVENOUS
  Filled 2019-09-11: qty 250

## 2019-09-11 MED ORDER — SODIUM CHLORIDE 0.9 % IV SOLN
INTRAVENOUS | Status: DC | PRN
  Administered 2019-09-11: 500 mL

## 2019-09-11 MED ORDER — ONDANSETRON HCL 4 MG/2ML IJ SOLN
INTRAMUSCULAR | Status: DC | PRN
  Administered 2019-09-11: 4 mg via INTRAVENOUS

## 2019-09-11 MED ORDER — SUCCINYLCHOLINE CHLORIDE 200 MG/10ML IV SOSY
PREFILLED_SYRINGE | INTRAVENOUS | Status: AC
  Filled 2019-09-11: qty 10

## 2019-09-11 MED ORDER — SUCCINYLCHOLINE CHLORIDE 20 MG/ML IJ SOLN
INTRAMUSCULAR | Status: DC | PRN
  Administered 2019-09-11: 100 mg via INTRAVENOUS

## 2019-09-11 MED ORDER — METOPROLOL SUCCINATE ER 50 MG PO TB24: 50.0000 mg | ORAL_TABLET | Freq: Every day | ORAL | Status: DC

## 2019-09-11 MED ORDER — HYDRALAZINE HCL 20 MG/ML IJ SOLN: 5.0000 mg | INTRAMUSCULAR | Status: DC | PRN

## 2019-09-11 MED ORDER — SUGAMMADEX SODIUM 200 MG/2ML IV SOLN
INTRAVENOUS | Status: DC | PRN
  Administered 2019-09-11: 200 mg via INTRAVENOUS

## 2019-09-11 MED ORDER — CHLORHEXIDINE GLUCONATE CLOTH 2 % EX PADS
6.0000 | MEDICATED_PAD | Freq: Every day | CUTANEOUS | Status: DC
  Administered 2019-09-11 – 2019-09-12 (×2): 6 via TOPICAL

## 2019-09-11 SURGICAL SUPPLY — 54 items
ADH SKN CLS APL DERMABOND .7 (GAUZE/BANDAGES/DRESSINGS) ×1
CANISTER SUCT 3000ML PPV (MISCELLANEOUS) ×3 IMPLANT
CATH ROBINSON RED A/P 18FR (CATHETERS) ×3 IMPLANT
CATH SUCT 10FR WHISTLE TIP (CATHETERS) ×3 IMPLANT
CLIP VESOCCLUDE MED 6/CT (CLIP) ×3 IMPLANT
CLIP VESOCCLUDE SM WIDE 6/CT (CLIP) ×3 IMPLANT
COVER PROBE W GEL 5X96 (DRAPES) ×2 IMPLANT
DERMABOND ADVANCED (GAUZE/BANDAGES/DRESSINGS) ×2
DERMABOND ADVANCED .7 DNX12 (GAUZE/BANDAGES/DRESSINGS) ×1 IMPLANT
DRAIN CHANNEL 15F RND FF W/TCR (WOUND CARE) IMPLANT
DRAPE HALF SHEET 40X57 (DRAPES) ×2 IMPLANT
ELECT REM PT RETURN 9FT ADLT (ELECTROSURGICAL) ×3
ELECTRODE REM PT RTRN 9FT ADLT (ELECTROSURGICAL) ×1 IMPLANT
EVACUATOR SILICONE 100CC (DRAIN) IMPLANT
GLOVE BIO SURGEON STRL SZ 6.5 (GLOVE) ×2 IMPLANT
GLOVE BIO SURGEONS STRL SZ 6.5 (GLOVE) ×2
GLOVE BIOGEL PI IND STRL 6.5 (GLOVE) IMPLANT
GLOVE BIOGEL PI IND STRL 7.5 (GLOVE) ×1 IMPLANT
GLOVE BIOGEL PI INDICATOR 6.5 (GLOVE) ×6
GLOVE BIOGEL PI INDICATOR 7.5 (GLOVE) ×2
GLOVE ECLIPSE 6.5 STRL STRAW (GLOVE) ×4 IMPLANT
GLOVE SURG SS PI 7.5 STRL IVOR (GLOVE) ×3 IMPLANT
GOWN STRL REUS W/ TWL LRG LVL3 (GOWN DISPOSABLE) ×2 IMPLANT
GOWN STRL REUS W/ TWL XL LVL3 (GOWN DISPOSABLE) ×2 IMPLANT
GOWN STRL REUS W/TWL LRG LVL3 (GOWN DISPOSABLE) ×9
GOWN STRL REUS W/TWL XL LVL3 (GOWN DISPOSABLE) ×6
HEMOSTAT SNOW SURGICEL 2X4 (HEMOSTASIS) ×2 IMPLANT
INSERT FOGARTY SM (MISCELLANEOUS) IMPLANT
KIT BASIN OR (CUSTOM PROCEDURE TRAY) ×3 IMPLANT
KIT SHUNT ARGYLE CAROTID ART 6 (VASCULAR PRODUCTS) IMPLANT
KIT TURNOVER KIT B (KITS) ×3 IMPLANT
NDL HYPO 25GX1X1/2 BEV (NEEDLE) IMPLANT
NEEDLE HYPO 25GX1X1/2 BEV (NEEDLE) IMPLANT
NS IRRIG 1000ML POUR BTL (IV SOLUTION) ×9 IMPLANT
PACK CAROTID (CUSTOM PROCEDURE TRAY) ×3 IMPLANT
PAD ARMBOARD 7.5X6 YLW CONV (MISCELLANEOUS) ×6 IMPLANT
PATCH VASC XENOSURE 1CMX6CM (Vascular Products) ×3 IMPLANT
PATCH VASC XENOSURE 1X6 (Vascular Products) IMPLANT
POSITIONER HEAD DONUT 9IN (MISCELLANEOUS) ×3 IMPLANT
SET WALTER ACTIVATION W/DRAPE (SET/KITS/TRAYS/PACK) IMPLANT
SHUNT CAROTID BYPASS 10 (VASCULAR PRODUCTS) IMPLANT
SHUNT CAROTID BYPASS 12FRX15.5 (VASCULAR PRODUCTS) IMPLANT
SUT ETHILON 3 0 PS 1 (SUTURE) IMPLANT
SUT PROLENE 6 0 BV (SUTURE) ×12 IMPLANT
SUT PROLENE 7 0 BV1 MDA (SUTURE) ×2 IMPLANT
SUT SILK 3 0 (SUTURE)
SUT SILK 3-0 18XBRD TIE 12 (SUTURE) IMPLANT
SUT VIC AB 3-0 SH 27 (SUTURE) ×6
SUT VIC AB 3-0 SH 27X BRD (SUTURE) ×2 IMPLANT
SUT VIC AB 3-0 X1 27 (SUTURE) ×2 IMPLANT
SUT VICRYL 4-0 PS2 18IN ABS (SUTURE) ×3 IMPLANT
SYR CONTROL 10ML LL (SYRINGE) IMPLANT
TOWEL GREEN STERILE (TOWEL DISPOSABLE) ×3 IMPLANT
WATER STERILE IRR 1000ML POUR (IV SOLUTION) ×3 IMPLANT

## 2019-09-11 NOTE — Discharge Instructions (Signed)
° °  [facility] [place] ° °Discharge Instructions °  °Carotid Endarterectomy (CEA) ° °Please refer to the following instructions for your post-procedure care. Your surgeon or physician assistant will discuss any changes with you. ° °Activity ° °You are encouraged to walk as much as you can. You can slowly return to normal activities but must avoid strenuous activity and heavy lifting until your doctor tell you it's okay. Avoid activities such as vacuuming or swinging a golf club. You can drive after one week if you are comfortable and you are no longer taking prescription pain medications. It is normal to feel tired for serval weeks after your surgery. It is also normal to have difficulty with sleep habits, eating, and bowel movements after surgery. These will go away with time. ° °Bathing/Showering ° °Shower daily after you go home. Do not soak in a bathtub, hot tub, or swim until the incision heals completely. ° °Incision Care ° °Shower every day. Clean your incision with mild soap and water. Pat the area dry with a clean towel. You do not need a bandage unless otherwise instructed. Do not apply any ointments or creams to your incision. You may have skin glue on your incision. Do not peel it off. It will come off on its own in about one week. Your incision may feel thickened and raised for several weeks after your surgery. This is normal and the skin will soften over time.  ° °For Men Only: It's okay to shave around the incision but do not shave the incision itself for 2 weeks. It is common to have numbness under your chin that could last for several months. ° °Diet ° °Resume your normal diet. There are no special food restrictions following this procedure. A low fat/low cholesterol diet is recommended for all patients with vascular disease. In order to heal from your surgery, it is CRITICAL to get adequate nutrition. Your body requires vitamins, minerals, and protein. Vegetables are the  best source of vitamins and minerals. Vegetables also provide the perfect balance of protein. Processed food has little nutritional value, so try to avoid this. ° °Medications ° °Resume taking all of your medications unless your doctor or physician assistant tells you not to. If your incision is causing pain, you may take over-the- counter pain relievers such as acetaminophen (Tylenol). If you were prescribed a stronger pain medication, please be aware these medications can cause nausea and constipation. Prevent nausea by taking the medication with a snack or meal. Avoid constipation by drinking plenty of fluids and eating foods with a high amount of fiber, such as fruits, vegetables, and grains.  °Do not take Tylenol if you are taking prescription pain medications. ° °Follow Up ° °Our office will schedule a follow up appointment 2-3 weeks following discharge. ° °Please call us immediately for any of the following conditions ° °Increased pain, redness, drainage (pus) from your incision site. °Fever of 101 degrees or higher. °If you should develop stroke (slurred speech, difficulty swallowing, weakness on one side of your body, loss of vision) you should call 911 and go to the nearest emergency room. ° °Reduce your risk of vascular disease: ° °Stop smoking. If you would like help call [facility] at 1-800-QUIT-NOW (1-800-784-8669) or [facility] at 336-586-4000. °Manage your cholesterol °Maintain a desired weight °Control your diabetes °Keep your blood pressure down ° °If you have any questions, please call the office at [phone]. ° °

## 2019-09-11 NOTE — Interval H&P Note (Signed)
History and Physical Interval Note:  09/11/2019 7:22 AM  [name]  has presented today for surgery, with the diagnosis of RIGHT CAROTID ARTERY STENOSIS.  The various methods of treatment have been discussed with the patient and family. After consideration of risks, benefits and other options for treatment, the patient has consented to  Procedure(s): ENDARTERECTOMY RIGHT  CAROTID (Right) as a surgical intervention.  The patient's history has been reviewed, patient examined, no change in status, stable for surgery.  I have reviewed the patient's chart and labs.  Questions were answered to the patient's satisfaction.     Annamarie Major

## 2019-09-11 NOTE — Anesthesia Procedure Notes (Signed)
Procedure Name: Intubation Date/Time: 09/11/2019 7:54 AM Performed by: Eligha Bridegroom, CRNA Pre-anesthesia Checklist: Patient identified, Emergency Drugs available, Suction available, Patient being monitored and Timeout performed Patient Re-evaluated:Patient Re-evaluated prior to induction Oxygen Delivery Method: Circle system utilized Preoxygenation: Pre-oxygenation with 100% oxygen Induction Type: IV induction and Rapid sequence Laryngoscope Size: Mac and 4 Grade View: Grade II Tube type: Oral Tube size: 7.5 mm Number of attempts: 1 Airway Equipment and Method: Stylet Placement Confirmation: ETT inserted through vocal cords under direct vision Secured at: 22 cm Tube secured with: Tape Dental Injury: Teeth and Oropharynx as per pre-operative assessment

## 2019-09-11 NOTE — Plan of Care (Signed)
  Problem: Clinical Measurements: Goal: Ability to maintain clinical measurements within normal limits will improve Outcome: Progressing Goal: Respiratory complications will improve Outcome: Progressing Goal: Cardiovascular complication will be avoided Outcome: Progressing   Problem: Elimination: Goal: Will not experience complications related to urinary retention Outcome: Progressing   Problem: Pain Managment: Goal: General experience of comfort will improve Outcome: Progressing   

## 2019-09-11 NOTE — Anesthesia Procedure Notes (Signed)
Arterial Line Insertion Start/End12/23/2020 7:22 AM, 09/11/2019 7:28 AM Performed by: Catalina Gravel, MD, anesthesiologist  Patient location: Pre-op. Preanesthetic checklist: patient identified, IV checked, site marked, risks and benefits discussed, surgical consent, monitors and equipment checked, pre-op evaluation, timeout performed and anesthesia consent Lidocaine 1% used for infiltration Left, radial was placed Catheter size: 20 Fr Hand hygiene performed  and maximum sterile barriers used   Attempts: 1 Procedure performed without using ultrasound guided technique. Following insertion, dressing applied and Biopatch. Post procedure assessment: normal and unchanged  Patient tolerated the procedure well with no immediate complications.

## 2019-09-11 NOTE — Anesthesia Postprocedure Evaluation (Signed)
Anesthesia Post Note  Patient: [name]  Procedure(s) Performed: ENDARTERECTOMY RIGHT  CAROTID (Right Neck) Patch Angioplasty Using Xenosure (Right Neck)     Patient location during evaluation: PACU Anesthesia Type: General Level of consciousness: awake and alert Pain management: pain level controlled Vital Signs Assessment: post-procedure vital signs reviewed and stable Respiratory status: spontaneous breathing, nonlabored ventilation, respiratory function stable and patient connected to nasal cannula oxygen Cardiovascular status: blood pressure returned to baseline and stable Postop Assessment: no apparent nausea or vomiting Anesthetic complications: no    Last Vitals:  Vitals:   09/11/19 1800 09/11/19 1815  BP: (!) 110/58   Pulse: 67 91  Resp: 20 (!) 23  Temp:    SpO2: 97% 98%    Last Pain:  Vitals:   09/11/19 1600  TempSrc:   PainSc: 0-No pain                 Tiajuana Amass

## 2019-09-11 NOTE — Transfer of Care (Signed)
Immediate Anesthesia Transfer of Care Note  Patient: [name]  Procedure(s) Performed: ENDARTERECTOMY RIGHT  CAROTID (Right Neck) Patch Angioplasty Using Xenosure (Right Neck)  Patient Location: PACU  Anesthesia Type:General  Level of Consciousness: awake, alert  and oriented  Airway & Oxygen Therapy: Patient Spontanous Breathing and Patient connected to nasal cannula oxygen  Post-op Assessment: Report given to RN and Post -op Vital signs reviewed and stable  Post vital signs: Reviewed and stable  Last Vitals:  Vitals Value Taken Time  BP 122/106 09/11/19 1002  Temp 36.8 C 09/11/19 1002  Pulse 104 09/11/19 1008  Resp 15 09/11/19 1008  SpO2 100 % 09/11/19 1008  Vitals shown include unvalidated device data.  Last Pain:  Vitals:   09/11/19 1002  TempSrc:   PainSc: 2          Complications: No apparent anesthesia complications

## 2019-09-11 NOTE — Op Note (Signed)
Patient name: [name] MRN: [id number] DOB: [birth year] Sex: male  09/11/2019 Pre-operative Diagnosis: Asymptomatic   right carotid stenosis Post-operative diagnosis:  Same Surgeon:  Durene Cal Assistants:  Doreatha Massed Procedure:    right carotid Endarterectomy with bovine pericardial patch angioplasty Anesthesia:  General Blood Loss:  100 Specimens:  none  Findings:  90 %stenosis; Thrombus:  none  Indications:  58 yo male with asymptomatic right carotid stenosis detected by U/S during work up for a neck mass  Procedure:  The patient was identified in the holding area and taken to [facility] OR ROOM 16  The patient was then placed supine on the table.   General endotrachial anesthesia was administered.  The patient was prepped and draped in the usual sterile fashion.  A time out was called and antibiotics were administered.  The incision was made along the anterior border of the right sternocleidomastoid muscle.  Cautery was used to dissect through the subcutaneous tissue.  The platysma muscle was divided with cautery.  The internal jugular vein was exposed along its anterior medial border.  The common facial vein was exposed and then divided between 2-0 silk ties and metal clips.  The common carotid artery was then circumferentially exposed and encircled with an umbilical tape.  The vagus nerve was identified and protected.  Next sharp dissection was used to expose the external carotid artery and the superior thyroid artery.  The were encircled with a blue vessel loop and a 2-0 silk tie respectively.  Finally, the internal carotid was carefully dissected free.  An umbilical tape was placed around the internal carotid artery distal to the diseased segment.  The hypoglossal nerve was visualized throughout and protected.  The patient was given systemic heparinization.  A bovine carotid patch was selected and prepared on the back table.  A 10 french shunt was also prepared.  After blood  pressure readings were appropriate and the heparin had been given time to circulate, the internal carotid artery was occluded with a baby Gregory clamp.  The external and common carotid arteries were then occluded with vascular clamps and the 2-0 tie tightened on the superior thyroid artery.  A #11 blade was used to make an arteriotomy in the common carotid artery.  This was extended with Potts scissors along the anterior and lateral border of the common and internal carotid artery.  Approximately 90% stenosis was identified.  There was no thrombus identified.  The 10 french shunt was not placed, as there was excellent backbleeding..  A kleiner kuntz elevator was used to perform endarterectomy.  An eversion endarterectomy was performed in the external carotid artery.  A good distal endpoint was obtained in the internal carotid artery.  The specimen was removed and sent to pathology.  Heparinized saline was used to irrigate the endarterectomized field.  All potential embolic debris was removed.  Bovine pericardial patch angioplasty was then performed using a running 6-0 Prolene. . The common internal and external carotid arteries were all appropriately flushed. The artery was again irrigated with heparin saline.  The anastomosis was then secured. The clamp was first released on the external carotid artery followed by the common carotid artery approximately 30 seconds later, bloodflow was reestablish through the internal carotid artery.  Next, a hand-held  Doppler was used to evaluate the signals in the common, external, and internal  carotid arteries, all of which had appropriate signals. I then administered  50 mg protamine. The wound was then irrigated.  After hemostasis  was achieved, the carotid sheath was reapproximated with 3-0 Vicryl. The  platysma muscle was reapproximated with running 3-0 Vicryl. The skin  was closed with 4-0 Vicryl. Dermabond was placed on the skin. The  patient was then successfully  extubated. His neurologic exam was  similar to his preprocedural exam. The patient was then taken to recovery room  in stable condition. There were no complications.     Disposition:  To PACU in stable condition.  Relevant Operative Details:  Ruptured ulcerated near occlusive plaque with a soft component present.  Stenosis was approximately 90%.    No shunt was utilized as there was excellent back bleeding.    Theotis Burrow, M.D., [facility] [place] Office: [phone] Pager:  [phone]

## 2019-09-12 ENCOUNTER — Encounter: Payer: Self-pay | Admitting: *Deleted

## 2019-09-12 LAB — CBC
HCT: 36.4 % — ABNORMAL LOW (ref 39.0–52.0)
Hemoglobin: 12 g/dL — ABNORMAL LOW (ref 13.0–17.0)
MCH: 31.3 pg (ref 26.0–34.0)
MCHC: 33 g/dL (ref 30.0–36.0)
MCV: 94.8 fL (ref 80.0–100.0)
Platelets: 252 10*3/uL (ref 150–400)
RBC: 3.84 MIL/uL — ABNORMAL LOW (ref 4.22–5.81)
RDW: 13.2 % (ref 11.5–15.5)
WBC: 10.9 10*3/uL — ABNORMAL HIGH (ref 4.0–10.5)
nRBC: 0 % (ref 0.0–0.2)

## 2019-09-12 LAB — GLUCOSE, CAPILLARY
Glucose-Capillary: 105 mg/dL — ABNORMAL HIGH (ref 70–99)
Glucose-Capillary: 97 mg/dL (ref 70–99)

## 2019-09-12 LAB — BASIC METABOLIC PANEL
Anion gap: 9 (ref 5–15)
BUN: 14 mg/dL (ref 6–20)
CO2: 21 mmol/L — ABNORMAL LOW (ref 22–32)
Calcium: 8.6 mg/dL — ABNORMAL LOW (ref 8.9–10.3)
Chloride: 110 mmol/L (ref 98–111)
Creatinine, Ser: 0.87 mg/dL (ref 0.61–1.24)
GFR calc Af Amer: >60 mL/min (ref >60)
GFR calc non Af Amer: >60 mL/min (ref >60)
Glucose, Bld: 99 mg/dL (ref 70–99)
Potassium: 4 mmol/L (ref 3.5–5.1)
Sodium: 140 mmol/L (ref 135–145)

## 2019-09-12 NOTE — Progress Notes (Signed)
    Subjective  - POD #1  No complaints   Physical Exam:  Incision c/d/i Neuro intact tongue midline, slight facial droop       Assessment/Plan:  POD #1  Doing wells s/p CEA Plan discharge once neo is off  Wells [name] 09/12/2019 7:36 AM --  Vitals:   09/12/19 0645 09/12/19 0700  BP:  129/72  Pulse:    Resp:  17  Temp: 98.4 F (36.9 C)   SpO2:      Intake/Output Summary (Last 24 hours) at 09/12/2019 0736 Last data filed at 09/12/2019 0600 Gross per 24 hour  Intake 2898.06 ml  Output 3075 ml  Net -176.94 ml     Laboratory CBC    Component Value Date/Time   WBC 10.9 (H) 09/12/2019 0404   HGB 12.0 (L) 09/12/2019 0404   HCT 36.4 (L) 09/12/2019 0404   PLT 252 09/12/2019 0404    BMET    Component Value Date/Time   NA 140 09/12/2019 0404   K 4.0 09/12/2019 0404   CL 110 09/12/2019 0404   CO2 21 (L) 09/12/2019 0404   GLUCOSE 99 09/12/2019 0404   BUN 14 09/12/2019 0404   CREATININE 0.87 09/12/2019 0404   CALCIUM 8.6 (L) 09/12/2019 0404   GFRNONAA >60 09/12/2019 0404   GFRAA >60 09/12/2019 0404    COAG Lab Results  Component Value Date   INR 1.1 09/05/2019   INR 2.7 05/03/2017   INR 2.4 04/05/2017   No results found for: PTT  Antibiotics Anti-infectives (From admission, onward)   Start     Dose/Rate Route Frequency Ordered Stop   09/11/19 1800  ceFAZolin (ANCEF) IVPB 2g/100 mL premix     2 g 200 mL/hr over 30 Minutes Intravenous Every 8 hours 09/11/19 1532 09/12/19 0223   09/11/19 0624  ceFAZolin (ANCEF) 2-4 GM/100ML-% IVPB    Note to Pharmacy: Cordelia Pen   : cabinet override      09/11/19 0624 09/11/19 1829   09/11/19 0620  ceFAZolin (ANCEF) IVPB 2g/100 mL premix     2 g 200 mL/hr over 30 Minutes Intravenous 30 min pre-op 09/11/19 0620 09/11/19 0830       V. Leia Alf, M.D., [facility] [place] Office: [phone] Pager:  [phone]

## 2019-09-12 NOTE — Progress Notes (Signed)
Pt has walked unit x3. Attempted to have bowel movement but did not, is peeing, and eating a full lunch. Mother at bedside with pt. Discharge teaching and instructions completed. Pt and mother and no further questions.

## 2019-09-12 NOTE — Discharge Summary (Signed)
Discharge Summary     [name] [birth year] 58 y.o. male  [id number]  Admission Date: 09/11/2019  Discharge Date: 09/12/2019 Physician: Dr. 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 male with asymptomatic right carotid stenosis detected by U/S during work up for a neck mass. After evaluation in the office and discussion of therapeutic options, elective endarterectomy was arranged.   Findings90 %stenosis; Thrombus:  none: right carotid Endarterectomy with bovine pericardial patch angioplasty   Hospital Course:  The patient was admitted to the hospital and taken to the operating room on 09/11/2019 and underwent  right carotid Endarterectomy with bovine pericardial patch angioplasty.  He tolerated the procedure well.  He was taken to the PACU and monitored.  Due to elevated blood pressure, Neo-Synephrine infusion was initiated.  He otherwise remained hemodynamically stable.  He was moved to the cardiac ICU for continued monitoring and titration of Neo-Synephrine.  He remained neurologically intact.  Postop day 1, he was awake, alert and in no apparent distress.  He continued to require Neo-Synephrine and over the course of the morning this was titrated down and his blood pressure remained in the 110-120 systolic range.  On evaluation, his tongue was midline, speech clear and he was tolerating regular diet.  His incision was well approximated with mild edema and ecchymosis.  No hematoma.  The pt tolerated the procedure well and was transported to the PACU in excellent condition.  The remainder of the hospital course consisted of increasing mobilization and increasing intake of solids without difficulty.   Recent Labs    09/12/19 0404  NA 140  K 4.0  CL 110  CO2 21*  GLUCOSE 99  BUN 14  CALCIUM 8.6*   Recent Labs    09/12/19 0404  WBC 10.9*  HGB 12.0*  HCT 36.4*  PLT  252   No results for input(s): INR in the last 72 hours.     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 Newly diagnosed diabetes (HCC) 11/30/2017  . Chronic systolic heart failure (HCC) 07/29/2015  . Atrial fibrillation (HCC) [I48.91] 07/09/2015  . Acute systolic heart failure (HCC)   . Acute systolic CHF (congestive heart failure) (HCC)   . Tachycardia 06/25/2015  . Elevated troponin 06/25/2015  . Hyperlipidemia 06/25/2015  . Cigarette nicotine dependence, uncomplicated 06/25/2015  . Dyspnea 06/25/2015  . Smoker 10/13/2013  . HYPERLIPIDEMIA 05/23/2007  . Obesity, Class II, BMI 35-39.9 05/23/2007  . Essential hypertension 05/23/2007  . ALLERGIC RHINITIS 05/23/2007   Past Medical History:  Diagnosis Date  . A-fib (HCC) 2013   chemically converted  . Arthritis    "here and there" -  . CHF (congestive heart failure) (HCC)   . Complication of anesthesia    Slow to awaken with colon surgery- early 2000  . Diabetes mellitus without complication (HCC)    type II  . Diverticulitis    ACTIVE WITH ABCESS  . History of blood transfusion    2000ish diverticulitis  . Hypertension   . Peripheral vascular disease (HCC)   . Pneumonia 2010    Allergies as of 09/12/2019      Reactions   Pseudoephedrine    REACTION: Elevated HR   Carvedilol Other (See Comments)  Pain across chest, feeling of impending doom      Medication List    TAKE these medications   acetaminophen 500 MG tablet Commonly known as: TYLENOL Take 500 mg by mouth every 6 (six) hours as needed for mild pain or moderate pain.   aspirin 81 MG tablet Take 81 mg by mouth daily.   BLACK ELDERBERRY(BERRY-FLOWER) PO Take 4,250 mg by mouth daily. 5 ml   cholecalciferol 25 MCG (1000 UT) tablet Commonly  known as: VITAMIN D Take 1,000 mg by mouth daily.   Coenzyme Q10 200 MG Tabs Take 200 mg by mouth daily.   Entresto 97-103 MG Generic drug: sacubitril-valsartan Take 1 tablet by mouth twice daily   furosemide 40 MG tablet Commonly known as: LASIX Take 1 tablet (40 mg total) by mouth daily. Please call for office visit [phone] What changed:   when to take this  reasons to take this  additional instructions   Krill Oil 500 MG Caps Take 500 mg by mouth daily.   metFORMIN 500 MG tablet Commonly known as: GLUCOPHAGE Take 500 mg by mouth daily.   metoprolol succinate 50 MG 24 hr tablet Commonly known as: TOPROL-XL Take 1 tablet once daily What changed:   how much to take  how to take this  when to take this  additional instructions   MULTIVITAL PO Take 1 tablet by mouth daily. MENS MEGA MULTIVIT.   PROBIOTIC DAILY PO Take 1 capsule by mouth daily. [employer]   Vitamin B-12 5000 MCG Subl Place 5,000 mcg under the tongue daily.        [facility] Discharge Instructions Carotid Endarterectomy (CEA)  Please refer to the following instructions for your post-procedure care. Your surgeon or physician assistant will discuss any changes with you.  Activity  You are encouraged to walk as much as you can. You can slowly return to normal activities but must avoid strenuous activity and heavy lifting until your doctor tell you it's OK. Avoid activities such as vacuuming or swinging a golf club. You can drive after one week if you are comfortable and you are no longer taking prescription pain medications. It is normal to feel tired for serval weeks after your surgery. It is also normal to have difficulty with sleep habits, eating, and bowel movements after surgery. These will go away with time.  Bathing/Showering  You may shower after you come home. Do not soak in a bathtub, hot tub, or swim until the incision heals  completely.  Incision Care  Shower every day. Clean your incision with mild soap and water. Pat the area dry with a clean towel. You do not need a bandage unless otherwise instructed. Do not apply any ointments or creams to your incision. You may have skin glue on your incision. Do not peel it off. It will come off on its own in about one week. Your incision may feel thickened and raised for several weeks after your surgery. This is normal and the skin will soften over time. For Men Only: It's OK to shave around the incision but do not shave the incision itself for 2 weeks. It is common to have numbness under your chin that could last for several months.  Diet  Resume your normal diet. There are no special food restrictions following this procedure. A low fat/low cholesterol diet is recommended for all patients with vascular disease. In order to heal from your surgery, it is CRITICAL to get adequate nutrition.  Your body requires vitamins, minerals, and protein. Vegetables are the best source of vitamins and minerals. Vegetables also provide the perfect balance of protein. Processed food has little nutritional value, so try to avoid this.  Medications  Resume taking all of your medications unless your doctor or physician assistant tells you not to.  If your incision is causing pain, you may take over-the- counter pain relievers such as acetaminophen (Tylenol). If you were prescribed a stronger pain medication, please be aware these medications can cause nausea and constipation.  Prevent nausea by taking the medication with a snack or meal. Avoid constipation by drinking plenty of fluids and eating foods with a high amount of fiber, such as fruits, vegetables, and grains.  Do not take Tylenol if you are taking prescription pain medications.  Follow Up  Our office will schedule a follow up appointment 2-3 weeks following discharge.  Please call us immediately for any of the following  conditions  . Increased pain, redness, drainage (pus) from your incision site. . Fever of 101 degrees or higher. . If you should develop stroke (slurred speech, difficulty swallowing, weakness on one side of your body, loss of vision) you should call 911 and go to the nearest emergency room. .  Reduce your risk of vascular disease:  . Stop smoking. If you would like help call [facility] at 1-800-QUIT-NOW ((214)646-5079) or [place] at 9734095261. . Manage your cholesterol . Maintain a desired weight . Control your diabetes . Keep your blood pressure down .  If you have any questions, please call the office at [phone].  Prescriptions given: None  Disposition: Home  Patient's condition: is Excellent  Follow up: 1. Dr. Myra Gianotti in 2 weeks.   Wendi Maya, PA-C [facility] [phone]   --- For [facility] use ---   Modified Rankin score at D/C (0-6):0  IV medication needed for:  1. Hypertension: Yes 2. Hypotension: No  Post-op Complications: No  1. Post-op CVA or TIA: No  If yes: Event classification (right eye, left eye, right cortical, left cortical, verterobasilar, other):   If yes: Timing of event (intra-op, <6 hrs post-op, >=6 hrs post-op, unknown): n/a  2. CN injury: No  If yes: CN none injuried   3. Myocardial infarction: No  If yes: Dx by (EKG or clinical, Troponin): n/a  4.  CHF: No  5.  Dysrhythmia (new): No  6. Wound infection: No  7. Reperfusion symptoms: No  8. Return to OR: No  If yes: return to OR for (bleeding, neurologic, other CEA incision, other): n/a  Discharge medications: Statin use:  No ASA use:  Yes   Beta blocker use:  No ACE-Inhibitor use: yes  ARB use:  No CCB use: No P2Y12 Antagonist use: No, [ ]  Plavix, [ ]  Plasugrel, [ ]  Ticlopinine, [ ]  Ticagrelor, [ ]  Other, [ ]  No for medical reason, [ ]  Non-compliant, [ ]  Not-indicated Anti-coagulant use:  No, [ ]  Warfarin, [ ]  Rivaroxaban, [ ]   Dabigatran,

## 2019-09-17 ENCOUNTER — Telehealth: Payer: Self-pay | Admitting: Surgery

## 2019-09-17 NOTE — Telephone Encounter (Signed)
Medical records were faxed to [facility] [phone] for FMLA/[place]

## 2019-10-04 ENCOUNTER — Telehealth (HOSPITAL_COMMUNITY): Payer: Self-pay

## 2019-10-04 NOTE — Telephone Encounter (Signed)

## 2019-10-07 ENCOUNTER — Encounter: Payer: Self-pay | Admitting: Surgery

## 2019-10-07 ENCOUNTER — Other Ambulatory Visit: Payer: Self-pay

## 2019-10-07 ENCOUNTER — Ambulatory Visit (INDEPENDENT_AMBULATORY_CARE_PROVIDER_SITE_OTHER): Payer: Self-pay | Admitting: Surgery

## 2019-10-07 VITALS — BP 128/82 | HR 78 | Temp 97.3°F | Resp 20 | Ht 65.0 in | Wt 200.0 lb

## 2019-10-07 DIAGNOSIS — I6521 Occlusion and stenosis of right carotid artery: Secondary | ICD-10-CM

## 2019-10-07 NOTE — Progress Notes (Signed)
Patient name: [name] MRN: [id number] DOB: 01/26/1961 Sex: male  REASON FOR VISIT:     post op  HISTORY OF PRESENT ILLNESS:   [name] is a 59 y.o. male, who is referred for evaluation of carotid stenosis.  This was detected when he had an ultrasound for a neck nodule.  He denies any symptoms.  Specifically he denies numbness or weakness in either extremity.  He denies slurred speech.  He denies amaurosis fugax.  He is a diabetic however his most recent hemoglobin A1c was 5.8.  He has a significant history of congestive heart failure.  When he initially presented his ejection fraction was around 10% however this is gone up to 60%.  He is very active at work and consistently walks 15-19,000 steps.  He was found to have an asymptomatic >80% right carotid stenosis.  On 09-11-2019, he underwent right carotid endarterectomy.  He was found to have a 90% near occlusive ulcerated plaque.  His post operative course was uncomplicated.    The patient refuses to take a statin.  He is a former smoker  CURRENT MEDICATIONS:    Current Outpatient Medications  Medication Sig Dispense Refill  . acetaminophen (TYLENOL) 500 MG tablet Take 500 mg by mouth every 6 (six) hours as needed for mild pain or moderate pain.    Marland Kitchen aspirin 81 MG tablet Take 81 mg by mouth daily.    Marland Kitchen BLACK ELDERBERRY,BERRY-FLOWER, PO Take 4,250 mg by mouth daily. 5 ml    . cholecalciferol (VITAMIN D) 25 MCG (1000 UT) tablet Take 1,000 mg by mouth daily.     . Coenzyme Q10 200 MG TABS Take 200 mg by mouth daily.     . Cyanocobalamin (VITAMIN B-12) 5000 MCG SUBL Place 5,000 mcg under the tongue daily.    Marland Kitchen ENTRESTO 97-103 MG Take 1 tablet by mouth twice daily (Patient taking differently: Take 1 tablet by mouth 2 (two) times daily. ) 180 tablet 3  . furosemide (LASIX) 40 MG tablet Take 1 tablet (40 mg total) by mouth daily. Please call for office visit [phone] (Patient taking  differently: Take 40 mg by mouth as needed for fluid or edema (Fluid retention). ) 90 tablet 0  . Krill Oil 500 MG CAPS Take 500 mg by mouth daily.    . metFORMIN (GLUCOPHAGE) 500 MG tablet Take 500 mg by mouth daily.     . metoprolol succinate (TOPROL-XL) 50 MG 24 hr tablet Take 1 tablet once daily (Patient taking differently: Take 50 mg by mouth at bedtime. ) 30 tablet 6  . Multiple Vitamins-Minerals (MULTIVITAL PO) Take 1 tablet by mouth daily. MENS MEGA MULTIVIT.     Marland Kitchen Probiotic Product (PROBIOTIC DAILY PO) Take 1 capsule by mouth daily. [employer]     No current facility-administered medications for this visit.    REVIEW OF SYSTEMS:   [X]  denotes positive finding, [ ]  denotes negative finding Cardiac  Comments:  Chest pain or chest pressure:    Shortness of breath upon exertion:    Short of breath when lying flat:    Irregular heart rhythm:    Constitutional    Fever or chills:      PHYSICAL EXAM:   There were no vitals filed for this visit.  GENERAL: The patient is a well-nourished male, in no acute distress. The vital signs are documented above. CARDIOVASCULAR: There is a regular rate and rhythm. PULMONARY: Non-labored respirations Smile symmetric Tongue midline Incision looks good  STUDIES:   none   MEDICAL ISSUES:   S/p right CEA.  Doing great.  Follow up 6 months with repeat duplex  Annamarie Major, IV, MD, FACS [facility] [phone] Pager [phone]

## 2019-10-09 ENCOUNTER — Other Ambulatory Visit: Payer: Self-pay | Admitting: *Deleted

## 2019-10-09 DIAGNOSIS — I6529 Occlusion and stenosis of unspecified carotid artery: Secondary | ICD-10-CM

## 2020-04-28 ENCOUNTER — Other Ambulatory Visit (HOSPITAL_COMMUNITY): Payer: Self-pay | Admitting: Cardiology

## 2020-04-28 DIAGNOSIS — I5022 Chronic systolic (congestive) heart failure: Secondary | ICD-10-CM

## 2020-05-18 ENCOUNTER — Encounter: Payer: Self-pay | Admitting: Surgery

## 2020-05-18 ENCOUNTER — Ambulatory Visit (INDEPENDENT_AMBULATORY_CARE_PROVIDER_SITE_OTHER): Payer: BC Managed Care – PPO | Admitting: Surgery

## 2020-05-18 ENCOUNTER — Other Ambulatory Visit: Payer: Self-pay

## 2020-05-18 ENCOUNTER — Ambulatory Visit (HOSPITAL_COMMUNITY)
Admission: RE | Admit: 2020-05-18 | Discharge: 2020-05-18 | Disposition: A | Discharge status: Home / Self Care | Payer: BC Managed Care – PPO | Source: Ambulatory Visit | Attending: Surgery | Admitting: Surgery

## 2020-05-18 VITALS — BP 130/81 | HR 76 | Temp 98.6°F | Resp 20 | Ht 65.0 in | Wt 200.9 lb

## 2020-05-18 DIAGNOSIS — I6529 Occlusion and stenosis of unspecified carotid artery: Secondary | ICD-10-CM | POA: Diagnosis not present

## 2020-05-18 DIAGNOSIS — I6521 Occlusion and stenosis of right carotid artery: Secondary | ICD-10-CM

## 2020-05-18 NOTE — Progress Notes (Signed)
[facility]  Patient name: [name] MRN: [id number] DOB: [birth year] Sex: male   REASON FOR VISIT:    Follow up  HISOTRY OF PRESENT ILLNESS:    [name] a 59 y.o.male, who isreferred for evaluation of carotid stenosis. This was detected when he had an ultrasound for a neck nodule. He denies any symptoms. Specifically he denies numbness or weakness in either extremity. He denies slurred speech. He denies amaurosis fugax. He is a diabetic however his most recent hemoglobin A1c was 5.8. He has a significant history of congestive heart failure. When he initially presented his ejection fraction was around 10% however this is gone up to 60%. He is very active at work and consistently walks 15-19,000 steps.  He was found to have an asymptomatic >80% right carotid stenosis.  On 09-11-2019, he underwent right carotid endarterectomy.  He was found to have a 90% near occlusive ulcerated plaque.  His post operative course was uncomplicated.    The patient refuses to take a statin. He is a former smoker   PAST MEDICAL HISTORY:   Past Medical History:  Diagnosis Date  . A-fib (HCC) 2013   chemically converted  . Arthritis    "here and there" -  . Carotid artery occlusion   . CHF (congestive heart failure) (HCC)   . Complication of anesthesia    Slow to awaken with colon surgery- early 2000  . Diabetes mellitus without complication (HCC)    type II  . Diverticulitis    ACTIVE WITH ABCESS  . History of blood transfusion    2000ish diverticulitis  . Hypertension   . Peripheral vascular disease (HCC)   . Pneumonia 2010     FAMILY HISTORY:   Family History  Problem Relation Age of Onset  . Hypertension Mother     SOCIAL HISTORY:   Social History   Tobacco Use  . Smoking status: Former Smoker    Types: Cigarettes    Quit date: 06/25/2015    Years since quitting: 4.9  .  Smokeless tobacco: Never Used  . Tobacco comment: on and off , quite for 10 years twice  Substance Use Topics  . Alcohol use: Not Currently    Alcohol/week: 0.0 standard drinks    Comment: none since 2016- CHF admission     ALLERGIES:   Allergies  Allergen Reactions  . Pseudoephedrine     REACTION: Elevated HR  . Carvedilol Other (See Comments)    Pain across chest, feeling of impending doom     CURRENT MEDICATIONS:   Current Outpatient Medications  Medication Sig Dispense Refill  . acetaminophen (TYLENOL) 500 MG tablet Take 500 mg by mouth every 6 (six) hours as needed for mild pain or moderate pain.    Marland Kitchen aspirin 81 MG tablet Take 81 mg by mouth daily.    Marland Kitchen BLACK ELDERBERRY,BERRY-FLOWER, PO Take 4,250 mg by mouth daily. 5 ml    . cholecalciferol (VITAMIN D) 25 MCG (1000 UT) tablet Take 1,000 mg by mouth daily.     . Coenzyme Q10 200 MG TABS Take 200 mg by mouth daily.     . Cyanocobalamin (VITAMIN B-12) 5000 MCG SUBL Place 5,000 mcg under the tongue daily.    Marland Kitchen ENTRESTO 97-103 MG Take 1 tablet by mouth twice daily 180 tablet 0  . furosemide (LASIX) 40 MG tablet Take 1 tablet (40 mg total) by mouth daily. Please call for office visit [phone] (Patient taking differently: Take  40 mg by mouth as needed for fluid or edema (Fluid retention). ) 90 tablet 0  . Krill Oil 500 MG CAPS Take 500 mg by mouth daily.    . metFORMIN (GLUCOPHAGE) 500 MG tablet Take 500 mg by mouth daily.     . metoprolol succinate (TOPROL-XL) 50 MG 24 hr tablet Take 1 tablet once daily (Patient taking differently: Take 50 mg by mouth at bedtime. ) 30 tablet 6  . Multiple Vitamins-Minerals (MULTIVITAL PO) Take 1 tablet by mouth daily. MENS MEGA MULTIVIT.     Marland Kitchen Probiotic Product (PROBIOTIC DAILY PO) Take 1 capsule by mouth daily. [employer]     No current facility-administered medications for this visit.    REVIEW OF SYSTEMS:   [X]  denotes positive finding, [ ]  denotes negative finding Cardiac   Comments:  Chest pain or chest pressure:    Shortness of breath upon exertion:    Short of breath when lying flat:    Irregular heart rhythm:        Vascular    Pain in calf, thigh, or hip brought on by ambulation:    Pain in feet at night that wakes you up from your sleep:     Blood clot in your veins:    Leg swelling:         Pulmonary    Oxygen at home:    Productive cough:     Wheezing:         Neurologic    Sudden weakness in arms or legs:     Sudden numbness in arms or legs:     Sudden onset of difficulty speaking or slurred speech:    Temporary loss of vision in one eye:     Problems with dizziness:         Gastrointestinal    Blood in stool:     Vomited blood:         Genitourinary    Burning when urinating:     Blood in urine:        Psychiatric    Major depression:         Hematologic    Bleeding problems:    Problems with blood clotting too easily:        Skin    Rashes or ulcers:        Constitutional    Fever or chills:      PHYSICAL EXAM:   Vitals:   05/18/20 1355 05/18/20 1359  BP: 109/71 130/81  Pulse: 76   Resp: 20   Temp: 98.6 F (37 C)   SpO2: 95%   Weight: 200 lb 14.4 oz (91.1 kg)   Height: 5\' 5"  (1.651 m)     GENERAL: The patient is a well-nourished male, in no acute distress. The vital signs are documented above. CARDIAC: There is a regular rate and rhythm.  PULMONARY: Non-labored respirations.  MUSCULOSKELETAL: There are no major deformities or cyanosis. NEUROLOGIC: No focal weakness or paresthesias are detected. SKIN: There are no ulcers or rashes noted. PSYCHIATRIC: The patient has a normal affect.  STUDIES:   I have reviewed the following:  Right Carotid: The extracranial vessels were near-normal with only minimal  wall         thickening or plaque.   Left Carotid: Velocities in the left ICA are consistent with a 1-39%  stenosis.   MEDICAL ISSUES:   Status post right carotid endarterectomy.  Ultrasound  shows widely patent endarterectomy site.  He remains neurologically  intact.  He will follow-up in 1 year with repeat surveillance imaging    Charlena Cross, MD, FACS [facility] [phone] Pager [phone]

## 2020-07-20 ENCOUNTER — Other Ambulatory Visit (HOSPITAL_COMMUNITY): Payer: Self-pay | Admitting: Cardiology

## 2020-07-20 DIAGNOSIS — I5022 Chronic systolic (congestive) heart failure: Secondary | ICD-10-CM

## 2020-07-23 ENCOUNTER — Other Ambulatory Visit (HOSPITAL_COMMUNITY): Payer: Self-pay | Admitting: Cardiology

## 2020-07-23 ENCOUNTER — Other Ambulatory Visit (HOSPITAL_COMMUNITY): Payer: Self-pay | Admitting: *Deleted

## 2020-07-23 DIAGNOSIS — I5022 Chronic systolic (congestive) heart failure: Secondary | ICD-10-CM

## 2020-07-23 MED ORDER — ENTRESTO 97-103 MG PO TABS: 1.0000 | ORAL_TABLET | Freq: Two times a day (BID) | ORAL | 0 refills | Status: DC

## 2020-08-24 ENCOUNTER — Other Ambulatory Visit (HOSPITAL_COMMUNITY): Payer: Self-pay | Admitting: Internal Medicine

## 2020-08-24 ENCOUNTER — Telehealth (HOSPITAL_COMMUNITY): Payer: Self-pay | Admitting: Internal Medicine

## 2020-08-24 DIAGNOSIS — I5022 Chronic systolic (congestive) heart failure: Secondary | ICD-10-CM

## 2020-08-24 MED ORDER — ENTRESTO 97-103 MG PO TABS: 1.0000 | ORAL_TABLET | Freq: Two times a day (BID) | ORAL | 1 refills | Status: DC

## 2020-08-24 NOTE — Telephone Encounter (Signed)
Pt came in office requested Entresto refill, please send script to [facility], pt scheduled appt 01/19 w/DB.  Thanks

## 2020-10-05 NOTE — Progress Notes (Addendum)
[facility] Note   Patient ID: [name], male   DOB: April 24, 1961, 60 y.o.   MRN: [id number]   PCP: Dr. Orvan [name] Primary HF Cardiologist: Dr [name]  HPI: Mr [name] is a 60 with systolic HF due to NICM, PAF, DM2, carotid stenosis s/p R CEA and HTN.   Admitted 10/16 with progressive dyspnea on exertion. Echo EF 25-30%. RHC/ LHC. showed minimal obstructive CAD, severe NICM, elevated filling pressures, and cardiogenic shock. At that point he was started on milrinone but later weaned off as he improved with adequate mixed venous saturation. cMRI performed NICM, EF 19%, mod MR, normal RV, and no infiltrative process or scar. Also had A fib RVR but chemically converted with amio 200 mg twice daily. Started on coumadin. (Refused DOAC)  He insisted on stopping anticoagulation. Has worn 30-day monitor in January-February with no AF. AC stopped  Echo 07/18/18  EF 50-55% personally reviewed   He returns today for HF follow up.  We have not seen him since 10/19. Says he is doing great. Working 40hrs/week at [employer]. Getting 17K+ steps per day. No CP or SOB. No edema, orthopnea or PND. SBP runs 110-130. No palpitations. Takes lasix 1-2x/week.  Cardiac studies:  ECHO 7/17 EF 30%  Seems to be 35-40% on Definity images  ECHO 1/17  EF 25-30% RV ok Echo 1/18 EF 40-45%   Review of systems complete and found to be negative unless listed in HPI.   SH:  Social History   Socioeconomic History  . Marital status: Divorced    Spouse name: Not on file  . Number of children: Not on file  . Years of education: Not on file  . Highest education level: Not on file  Occupational History  . Not on file  Tobacco Use  . Smoking status: Former Smoker    Types: Cigarettes    Quit date: 06/25/2015    Years since quitting: 5.2  . Smokeless tobacco: Never Used  . Tobacco comment: on and off , quite for 10 years twice  Vaping Use  . Vaping Use: Never  used  Substance and Sexual Activity  . Alcohol use: Not Currently    Alcohol/week: 0.0 standard drinks    Comment: none since 2016- CHF admission  . Drug use: Never  . Sexual activity: Not on file  Other Topics Concern  . Not on file  Social History Narrative  . Not on file   Social Determinants of Health   Financial Resource Strain: Not on file  Food Insecurity: Not on file  Transportation Needs: Not on file  Physical Activity: Not on file  Stress: Not on file  Social Connections: Not on file  Intimate Partner Violence: Not on file    FH:  Family History  Problem Relation Age of Onset  . Hypertension Mother     Past Medical History:  Diagnosis Date  . A-fib (HCC) 2013   chemically converted  . Arthritis    "here and there" -  . Carotid artery occlusion   . CHF (congestive heart failure) (HCC)   . Complication of anesthesia    Slow to awaken with colon surgery- early 2000  . Diabetes mellitus without complication (HCC)    type II  . Diverticulitis    ACTIVE WITH ABCESS  . History of blood transfusion    2000ish diverticulitis  . Hypertension   . Peripheral vascular disease (HCC)   . Pneumonia 2010    Current  Outpatient Medications  Medication Sig Dispense Refill  . acetaminophen (TYLENOL) 500 MG tablet Take 500 mg by mouth every 6 (six) hours as needed for mild pain or moderate pain.    Marland Kitchen aspirin 81 MG tablet Take 81 mg by mouth daily.    Marland Kitchen BLACK ELDERBERRY,BERRY-FLOWER, PO Take 4,250 mg by mouth daily. 5 ml    . cholecalciferol (VITAMIN D) 25 MCG (1000 UT) tablet Take 1,000 mg by mouth daily.     . Coenzyme Q10 200 MG TABS Take 200 mg by mouth daily.     . Cyanocobalamin (VITAMIN B-12) 5000 MCG SUBL Place 5,000 mcg under the tongue daily.    . furosemide (LASIX) 40 MG tablet Take 1 tablet (40 mg total) by mouth daily. Please call for office visit [phone] (Patient taking differently: Take 40 mg by mouth as needed for fluid or edema (Fluid retention). )  90 tablet 0  . Krill Oil 500 MG CAPS Take 500 mg by mouth daily.    . metFORMIN (GLUCOPHAGE) 500 MG tablet Take 500 mg by mouth daily.     . metoprolol succinate (TOPROL-XL) 50 MG 24 hr tablet Take 1 tablet once daily (Patient taking differently: Take 50 mg by mouth at bedtime. ) 30 tablet 6  . Multiple Vitamins-Minerals (MULTIVITAL PO) Take 1 tablet by mouth daily. MENS MEGA MULTIVIT.     Marland Kitchen Probiotic Product (PROBIOTIC DAILY PO) Take 1 capsule by mouth daily. [employer]'s [employer]    . sacubitril-valsartan (ENTRESTO) 97-103 MG Take 1 tablet by mouth 2 (two) times daily. 60 tablet 1   No current facility-administered medications for this encounter.    Vitals:   10/07/20 1513  BP: 126/60  Pulse: 90  SpO2: 96%  Weight: 94 kg (207 lb 3.2 oz)   Wt Readings from Last 3 Encounters:  05/18/20 91.1 kg (200 lb 14.4 oz)  10/07/19 90.7 kg (200 lb)  09/12/19 86.9 kg (191 lb 9.3 oz)    PHYSICAL EXAM: General:  Well appearing. No resp difficulty HEENT: normal Neck: supple. no JVD. R CEA scar  Carotids 2+ bilat; no bruits. No lymphadenopathy or thryomegaly appreciated. Cor: PMI nondisplaced. Regular rate & rhythm. No rubs, gallops or murmurs. Lungs: clear Abdomen:obese soft, nontender, nondistended. No hepatosplenomegaly. No bruits or masses. Good bowel sounds. Extremities: no cyanosis, clubbing, rash, edema Neuro: alert & orientedx3, cranial nerves grossly intact. moves all 4 extremities w/o difficulty. Affect pleasant  ECG: NSR 81 No ST-T wave abnormalities. Personally reviewed  ASSESSMENT & PLAN: 1. Chronic Systolic HF. LVEF 15-20% -> cardiogenic shock. Cath with minimal non-obstructive CAD --06/2015 cMRI- NICM EF 19% mod MR. RV normal. No infiltrative process.  - Echo 1/18 EF 40-45% - Echo 10/19 EF 50-55% --NYHA I. Volume status looks great.  --Intolerant carvedilol at the lowest dose due to feeling of doom.  --On toprol 50mg  qhs failed titration to 75mg  several times --Continue goal-dose  Entresto 97-103 bid.   --Of spironolactone for unclear reasons --Check echo to ensure stability of EF -- Consider SGLT2i as needed  2. NSVT - no significant ventricular arrhythmias on Event monitor 09/2016   3. 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 Off ASA with warfarin. - No s/s ischemia  4. Paroxsymal A fib RVR 06/2015  - Has been off amio. - 30-day event monitor in January and February with no AF - He has stopped taking coumadin. Aware of increased risk of stroke.   5. Former smoker  - quit 06/25/2015.   6.  Large Ventral Hernia - Considering mesh repair. Following with Dr. Dwain Sarna. - Given improved LVEF would be ok to proceed with hernia repair from a CV standpoint with only mild to moderate risk of peri-op CV complications - No change  7. HTN - Blood pressure well controlled. Continue current regimen.  8. DM - followed by Dr. Modesto Charon. HgbA1c 5.8 - continue metformin - consider SGLT2i   9. Carotid stenosis  - s/p R CEA ([facility])  Arvilla Meres, MD  11:02 PM

## 2020-10-07 ENCOUNTER — Other Ambulatory Visit: Payer: Self-pay

## 2020-10-07 ENCOUNTER — Encounter (HOSPITAL_COMMUNITY): Payer: Self-pay | Admitting: Internal Medicine

## 2020-10-07 ENCOUNTER — Ambulatory Visit (HOSPITAL_COMMUNITY)
Admission: RE | Admit: 2020-10-07 | Discharge: 2020-10-07 | Disposition: A | Discharge status: Home / Self Care | Payer: BC Managed Care – PPO | Source: Ambulatory Visit | Attending: Internal Medicine | Admitting: Internal Medicine

## 2020-10-07 VITALS — BP 126/60 | HR 90 | Wt 207.2 lb

## 2020-10-07 DIAGNOSIS — I428 Other cardiomyopathies: Secondary | ICD-10-CM | POA: Diagnosis not present

## 2020-10-07 DIAGNOSIS — I472 Ventricular tachycardia: Secondary | ICD-10-CM | POA: Insufficient documentation

## 2020-10-07 DIAGNOSIS — I5022 Chronic systolic (congestive) heart failure: Secondary | ICD-10-CM | POA: Diagnosis not present

## 2020-10-07 DIAGNOSIS — I480 Paroxysmal atrial fibrillation: Secondary | ICD-10-CM | POA: Diagnosis not present

## 2020-10-07 DIAGNOSIS — Z7982 Long term (current) use of aspirin: Secondary | ICD-10-CM | POA: Diagnosis not present

## 2020-10-07 DIAGNOSIS — I110 Hypertensive heart disease with heart failure: Secondary | ICD-10-CM | POA: Diagnosis present

## 2020-10-07 DIAGNOSIS — Z79899 Other long term (current) drug therapy: Secondary | ICD-10-CM | POA: Diagnosis not present

## 2020-10-07 DIAGNOSIS — Z7984 Long term (current) use of oral hypoglycemic drugs: Secondary | ICD-10-CM | POA: Diagnosis not present

## 2020-10-07 DIAGNOSIS — Z7901 Long term (current) use of anticoagulants: Secondary | ICD-10-CM | POA: Diagnosis not present

## 2020-10-07 DIAGNOSIS — I5032 Chronic diastolic (congestive) heart failure: Secondary | ICD-10-CM | POA: Diagnosis not present

## 2020-10-07 DIAGNOSIS — Z87891 Personal history of nicotine dependence: Secondary | ICD-10-CM | POA: Insufficient documentation

## 2020-10-07 DIAGNOSIS — K439 Ventral hernia without obstruction or gangrene: Secondary | ICD-10-CM | POA: Insufficient documentation

## 2020-10-07 DIAGNOSIS — I2510 Atherosclerotic heart disease of native coronary artery without angina pectoris: Secondary | ICD-10-CM | POA: Insufficient documentation

## 2020-10-07 DIAGNOSIS — E119 Type 2 diabetes mellitus without complications: Secondary | ICD-10-CM | POA: Diagnosis not present

## 2020-10-07 DIAGNOSIS — I6529 Occlusion and stenosis of unspecified carotid artery: Secondary | ICD-10-CM | POA: Insufficient documentation

## 2020-10-07 DIAGNOSIS — I10 Essential (primary) hypertension: Secondary | ICD-10-CM | POA: Diagnosis not present

## 2020-10-07 NOTE — Addendum Note (Signed)
Encounter addended by: Samara Snide, RN on: 10/07/2020 3:38 PM  Actions taken: Order list changed, Diagnosis association updated, Clinical Note Signed

## 2020-10-07 NOTE — Patient Instructions (Signed)
Your physician has requested that you have an echocardiogram. Echocardiography is a painless test that uses sound waves to create images of your heart. It provides your doctor with information about the size and shape of your heart and how well your heart's chambers and valves are working. This procedure takes approximately one hour. There are no restrictions for this procedure.   Please call our office in January 2023 to schedule your follow up appointment  If you have any questions or concerns before your next appointment please send [place] a message through [employer] or call our office at [phone].    TO LEAVE A MESSAGE FOR THE NURSE SELECT OPTION 2, PLEASE LEAVE A MESSAGE INCLUDING: . YOUR NAME . DATE OF BIRTH . CALL BACK NUMBER . REASON FOR CALL**this is important as we prioritize the call backs  YOU WILL RECEIVE A CALL BACK THE SAME DAY AS LONG AS YOU CALL BEFORE 4:00 PM

## 2020-10-07 NOTE — Addendum Note (Signed)
Encounter addended by: Dolores Patty, MD on: 10/07/2020 4:13 PM  Actions taken: Clinical Note Signed

## 2020-10-07 NOTE — Addendum Note (Signed)
Encounter addended by: Samara Snide, RN on: 10/07/2020 3:43 PM  Actions taken: Order list changed

## 2020-10-22 ENCOUNTER — Other Ambulatory Visit: Payer: Self-pay

## 2020-10-22 ENCOUNTER — Ambulatory Visit (HOSPITAL_COMMUNITY)
Admission: RE | Admit: 2020-10-22 | Discharge: 2020-10-22 | Disposition: A | Discharge status: Home / Self Care | Payer: BC Managed Care – PPO | Source: Ambulatory Visit | Attending: Family Medicine | Admitting: Family Medicine

## 2020-10-22 DIAGNOSIS — E119 Type 2 diabetes mellitus without complications: Secondary | ICD-10-CM | POA: Diagnosis not present

## 2020-10-22 DIAGNOSIS — I429 Cardiomyopathy, unspecified: Secondary | ICD-10-CM | POA: Insufficient documentation

## 2020-10-22 DIAGNOSIS — I110 Hypertensive heart disease with heart failure: Secondary | ICD-10-CM | POA: Diagnosis not present

## 2020-10-22 DIAGNOSIS — I5032 Chronic diastolic (congestive) heart failure: Secondary | ICD-10-CM | POA: Insufficient documentation

## 2020-10-22 DIAGNOSIS — I4891 Unspecified atrial fibrillation: Secondary | ICD-10-CM | POA: Diagnosis not present

## 2020-10-22 DIAGNOSIS — I082 Rheumatic disorders of both aortic and tricuspid valves: Secondary | ICD-10-CM | POA: Insufficient documentation

## 2020-10-22 LAB — ECHOCARDIOGRAM COMPLETE
Area-P 1/2: 8.25 cm2
S' Lateral: 3.5 cm
Single Plane A4C EF: 58.3 %

## 2020-10-22 NOTE — Progress Notes (Signed)
  Echocardiogram 2D Echocardiogram has been performed.  Pieter Partridge 10/22/2020, 9:26 AM

## 2020-10-24 ENCOUNTER — Other Ambulatory Visit (HOSPITAL_COMMUNITY): Payer: Self-pay | Admitting: Internal Medicine

## 2020-10-24 DIAGNOSIS — I5022 Chronic systolic (congestive) heart failure: Secondary | ICD-10-CM

## 2020-11-19 ENCOUNTER — Other Ambulatory Visit (HOSPITAL_COMMUNITY): Payer: Self-pay | Admitting: Internal Medicine

## 2020-11-19 DIAGNOSIS — I5022 Chronic systolic (congestive) heart failure: Secondary | ICD-10-CM

## 2020-11-23 ENCOUNTER — Other Ambulatory Visit (HOSPITAL_COMMUNITY): Payer: Self-pay | Admitting: *Deleted

## 2020-11-23 DIAGNOSIS — I5022 Chronic systolic (congestive) heart failure: Secondary | ICD-10-CM

## 2020-11-23 MED ORDER — ENTRESTO 97-103 MG PO TABS: 1.0000 | ORAL_TABLET | Freq: Two times a day (BID) | ORAL | 0 refills | Status: DC

## 2020-12-24 ENCOUNTER — Other Ambulatory Visit (HOSPITAL_COMMUNITY): Payer: Self-pay | Admitting: Internal Medicine

## 2020-12-24 DIAGNOSIS — I5022 Chronic systolic (congestive) heart failure: Secondary | ICD-10-CM

## 2021-07-05 ENCOUNTER — Ambulatory Visit: Payer: BC Managed Care – PPO | Admitting: Surgery

## 2021-07-12 ENCOUNTER — Other Ambulatory Visit: Payer: Self-pay

## 2021-07-12 ENCOUNTER — Encounter: Payer: Self-pay | Admitting: Surgery

## 2021-07-12 ENCOUNTER — Ambulatory Visit (INDEPENDENT_AMBULATORY_CARE_PROVIDER_SITE_OTHER): Payer: BC Managed Care – PPO | Admitting: Surgery

## 2021-07-12 VITALS — BP 125/79 | HR 79 | Temp 97.6°F | Resp 20 | Ht 65.0 in | Wt 208.5 lb

## 2021-07-12 DIAGNOSIS — I6521 Occlusion and stenosis of right carotid artery: Secondary | ICD-10-CM

## 2021-07-12 NOTE — Progress Notes (Signed)
[facility]  Patient name: [name] MRN: [id number] DOB: Mar 04, 1961 Sex: male   REASON FOR VISIT:    Follow up  HISOTRY OF PRESENT ILLNESS:    [name] is a 60 year old gentleman who was found to have carotid stenosis when he underwent a work-up for a neck nodule.  On ultrasound, he had a >80% right carotid stenosis which was asymptomatic.  On 09-11-2019, he underwent right carotid endarterectomy.  He was found to have a 90% near occlusive ulcerated plaque.  His post operative course was uncomplicated.     The patient refuses to take a statin.  He is a former smoker.  He is a diabetic.  He does have some congestive heart failure.  He remains very active walking approximately 16,000 steps while working at [employer].  He tries to eat healthy.  He denies any neurologic symptoms.   PAST MEDICAL HISTORY:   Past Medical History:  Diagnosis Date   A-fib (HCC) 2013   chemically converted   Arthritis    "here and there" -   Carotid artery occlusion    CHF (congestive heart failure) (HCC)    Complication of anesthesia    Slow to awaken with colon surgery- early 2000   Diabetes mellitus without complication (HCC)    type II   Diverticulitis    ACTIVE WITH ABCESS   History of blood transfusion    2000ish diverticulitis   Hypertension    Peripheral vascular disease (HCC)    Pneumonia 2010     FAMILY HISTORY:   Family History  Problem Relation Age of Onset   Hypertension Mother     SOCIAL HISTORY:   Social History   Tobacco Use   Smoking status: Former    Types: Cigarettes    Quit date: 06/25/2015    Years since quitting: 6.0   Smokeless tobacco: Never   Tobacco comments:    on and off , quite for 10 years twice  Substance Use Topics   Alcohol use: Not Currently    Alcohol/week: 0.0 standard drinks    Comment: none since 2016- CHF admission     ALLERGIES:   Allergies   Allergen Reactions   Pseudoephedrine     REACTION: Elevated HR   Carvedilol Other (See Comments)    Pain across chest, feeling of impending doom     CURRENT MEDICATIONS:   Current Outpatient Medications  Medication Sig Dispense Refill   acetaminophen (TYLENOL) 500 MG tablet Take 500 mg by mouth every 6 (six) hours as needed for mild pain or moderate pain.     aspirin 81 MG tablet Take 81 mg by mouth daily.     BLACK ELDERBERRY,BERRY-FLOWER, PO Take 4,250 mg by mouth daily. 5 ml     cholecalciferol (VITAMIN D) 25 MCG (1000 UT) tablet Take 1,000 mg by mouth daily.      Coenzyme Q10 200 MG TABS Take 200 mg by mouth daily.      Cyanocobalamin (VITAMIN B-12) 5000 MCG SUBL Place 5,000 mcg under the tongue daily.     ENTRESTO 97-103 MG Take 1 tablet by mouth twice daily 60 tablet 3   furosemide (LASIX) 40 MG tablet TAKE 1 TABLET BY MOUTH ONCE DAILY -  PLEASE  CALL  #[phone]  FOR  AN  OFFICE  VISIT 90 tablet 0   Krill Oil 500 MG CAPS Take 500 mg by mouth daily.     metFORMIN (GLUCOPHAGE) 500 MG tablet Take  500 mg by mouth daily.      metoprolol succinate (TOPROL-XL) 50 MG 24 hr tablet Take 1 tablet once daily 30 tablet 6   Multiple Vitamins-Minerals (MULTIVITAL PO) Take 1 tablet by mouth daily. MENS MEGA MULTIVIT.     Probiotic Product (PROBIOTIC DAILY PO) Take 1 capsule by mouth daily. [employer]'s club     No current facility-administered medications for this visit.    REVIEW OF SYSTEMS:   [X]  denotes positive finding, [ ]  denotes negative finding Cardiac  Comments:  Chest pain or chest pressure:    Shortness of breath upon exertion:    Short of breath when lying flat:    Irregular heart rhythm:        Vascular    Pain in calf, thigh, or hip brought on by ambulation:    Pain in feet at night that wakes you up from your sleep:     Blood clot in your veins:    Leg swelling:         Pulmonary    Oxygen at home:    Productive cough:     Wheezing:         Neurologic    Sudden  weakness in arms or legs:     Sudden numbness in arms or legs:     Sudden onset of difficulty speaking or slurred speech:    Temporary loss of vision in one eye:     Problems with dizziness:         Gastrointestinal    Blood in stool:     Vomited blood:         Genitourinary    Burning when urinating:     Blood in urine:        Psychiatric    Major depression:         Hematologic    Bleeding problems:    Problems with blood clotting too easily:        Skin    Rashes or ulcers:        Constitutional    Fever or chills:      PHYSICAL EXAM:   Vitals:   07/12/21 0853 07/12/21 0854  BP: 110/72 125/79  Pulse: 79   Resp: 20   Temp: 97.6 F (36.4 C)   SpO2: 95%   Weight: 94.6 kg   Height: 5\' 5"  (1.651 m)     GENERAL: The patient is a well-nourished male, in no acute distress. The vital signs are documented above. CARDIAC: There is a regular rate and rhythm.  PULMONARY: Non-labored respirations MUSCULOSKELETAL: There are no major deformities or cyanosis. NEUROLOGIC: No focal weakness or paresthesias are detected. SKIN: There are no ulcers or rashes noted. PSYCHIATRIC: The patient has a normal affect.  STUDIES:   The patient brought a life screen exam with him.  This shows normal carotid arteries bilaterally  MEDICAL ISSUES:   Status post right carotid endarterectomy: The patient could not afford to have a ultrasound performed here in our office and so he has a life screening exam that he brought with him.  This shows normal carotid arteries.  This is consistent with our most recent duplex.  He will continue his current medical regimen.  I have him scheduled for follow-up in 1 year.  He will most likely get another life screening exam.  As long as this shows normal arteries, it should be sufficient.  I did tell him that if there is anything abnormal on that study that we  may need to get a ultrasound here in the office.    Charlena Cross, MD, FACS [facility] [phone] Pager [phone]

## 2021-09-06 DIAGNOSIS — J029 Acute pharyngitis, unspecified: Secondary | ICD-10-CM | POA: Diagnosis not present

## 2021-09-06 DIAGNOSIS — R0981 Nasal congestion: Secondary | ICD-10-CM | POA: Diagnosis not present

## 2021-09-06 DIAGNOSIS — R059 Cough, unspecified: Secondary | ICD-10-CM | POA: Diagnosis not present

## 2022-01-17 ENCOUNTER — Other Ambulatory Visit (HOSPITAL_COMMUNITY): Payer: Self-pay | Admitting: Internal Medicine

## 2022-01-17 DIAGNOSIS — I5022 Chronic systolic (congestive) heart failure: Secondary | ICD-10-CM

## 2022-02-14 NOTE — Progress Notes (Incomplete)
Advanced Heart Failure Clinic Note   Patient ID: [name], male   DOB: 02-25-1961, 61 y.o.   MRN: [id number]   PCP: Dr. Orvan July Primary HF Cardiologist: Dr Gala Romney  HPI: Mr Pelzer is a 61 y/o male with systolic HF due to NICM, PAF, DM2, carotid stenosis s/p R CEA and HTN.   Admitted 10/16 with progressive dyspnea on exertion. Echo EF 25-30%. RHC/ LHC. showed minimal obstructive CAD, severe NICM, elevated filling pressures, and cardiogenic shock. Required milrinone. cMRI EF 19%, mod MR, normal RV, and no infiltrative process or scar.  Also had A fib RVR but chemically converted with amio 200 mg twice daily. Started on coumadin. (Refused DOAC)  He insisted on stopping anticoagulation. Has worn 30-day monitor in January-February 2018 with no AF. AC stopped  Echo 07/18/18  EF 50-55%  Echo 2/22 EF 55%  He returns today for HF follow up. Says he is doing great. Working 40hrs/week at [employer]. Getting 17K+ steps per day. No CP or SOB. No edema, orthopnea or PND. SBP runs 110-130. No palpitations. Takes lasix 1-2x/week.  Cardiac studies:  ECHO 7/17 EF 30%  Seems to be 35-40% on Definity images  ECHO 1/17  EF 25-30% RV ok Echo 1/18 EF 40-45%   Review of systems complete and found to be negative unless listed in HPI.   SH:  Social History   Socioeconomic History   Marital status: Divorced    Spouse name: Not on file   Number of children: Not on file   Years of education: Not on file   Highest education level: Not on file  Occupational History   Not on file  Tobacco Use   Smoking status: Former    Types: Cigarettes    Quit date: 06/25/2015    Years since quitting: 6.6   Smokeless tobacco: Never   Tobacco comments:    on and off , quite for 10 years twice  Vaping Use   Vaping Use: Never used  Substance and Sexual Activity   Alcohol use: Not Currently    Alcohol/week: 0.0 standard drinks    Comment: none since 2016- CHF admission   Drug  use: Never   Sexual activity: Not on file  Other Topics Concern   Not on file  Social History Narrative   Not on file   Social Determinants of Health   Financial Resource Strain: Not on file  Food Insecurity: Not on file  Transportation Needs: Not on file  Physical Activity: Not on file  Stress: Not on file  Social Connections: Not on file  Intimate Partner Violence: Not on file    FH:  Family History  Problem Relation Age of Onset   Hypertension Mother     Past Medical History:  Diagnosis Date   A-fib (HCC) 2013   chemically converted   Arthritis    "here and there" -   Carotid artery occlusion    CHF (congestive heart failure) (HCC)    Complication of anesthesia    Slow to awaken with colon surgery- early 2000   Diabetes mellitus without complication (HCC)    type II   Diverticulitis    ACTIVE WITH ABCESS   History of blood transfusion    2000ish diverticulitis   Hypertension    Peripheral vascular disease (HCC)    Pneumonia 2010    Current Outpatient Medications  Medication Sig Dispense Refill   acetaminophen (TYLENOL) 500 MG tablet Take 500 mg by mouth every 6 (  six) hours as needed for mild pain or moderate pain.     aspirin 81 MG tablet Take 81 mg by mouth daily.     BLACK ELDERBERRY,BERRY-FLOWER, PO Take 4,250 mg by mouth daily. 5 ml     cholecalciferol (VITAMIN D) 25 MCG (1000 UT) tablet Take 1,000 mg by mouth daily.      Coenzyme Q10 200 MG TABS Take 200 mg by mouth daily.      Cyanocobalamin (VITAMIN B-12) 5000 MCG SUBL Place 5,000 mcg under the tongue daily.     furosemide (LASIX) 40 MG tablet TAKE 1 TABLET BY MOUTH ONCE DAILY -  PLEASE  CALL  #[phone]  FOR  AN  OFFICE  VISIT 90 tablet 0   Krill Oil 500 MG CAPS Take 500 mg by mouth daily.     metFORMIN (GLUCOPHAGE) 500 MG tablet Take 500 mg by mouth daily.      metoprolol succinate (TOPROL-XL) 50 MG 24 hr tablet Take 1 tablet once daily 30 tablet 6   Multiple Vitamins-Minerals (MULTIVITAL PO)  Take 1 tablet by mouth daily. MENS MEGA MULTIVIT.     Probiotic Product (PROBIOTIC DAILY PO) Take 1 capsule by mouth daily. Sam's club     sacubitril-valsartan (ENTRESTO) 97-103 MG Take 1 tablet by mouth 2 (two) times daily. NEEDS FOLLOW UP APPOINTMENT FOR ANYMORE REFILLS 60 tablet 0   No current facility-administered medications for this encounter.    There were no vitals filed for this visit.  Wt Readings from Last 3 Encounters:  07/12/21 94.6 kg (208 lb 8 oz)  10/07/20 94 kg (207 lb 3.2 oz)  05/18/20 91.1 kg (200 lb 14.4 oz)    PHYSICAL EXAM: General:  Well appearing. No resp difficulty HEENT: normal Neck: supple. no JVD. R CEA scar  Carotids 2+ bilat; no bruits. No lymphadenopathy or thryomegaly appreciated. Cor: PMI nondisplaced. Regular rate & rhythm. No rubs, gallops or murmurs. Lungs: clear Abdomen:obese soft, nontender, nondistended. No hepatosplenomegaly. No bruits or masses. Good bowel sounds. Extremities: no cyanosis, clubbing, rash, edema Neuro: alert & orientedx3, cranial nerves grossly intact. moves all 4 extremities w/o difficulty. Affect pleasant  ECG: NSR 81 No ST-T wave abnormalities. Personally reviewed  ASSESSMENT & PLAN: 1. Chronic Systolic HF. LVEF 15-20% -> cardiogenic shock. Cath with minimal non-obstructive CAD --06/2015 cMRI- NICM EF 19% mod MR. RV normal. No infiltrative process.  - Echo 1/18 EF 40-45% - Echo 10/19 EF 50-55% - Echo 2/22 EF 55% --NYHA I. Volume status looks great.  --Intolerant carvedilol at the lowest dose due to feeling of doom.  --On toprol 50mg  qhs failed titration to 75mg  several times --Continue goal-dose Entresto 97-103 bid.   --Of spironolactone for unclear reasons --He has not wanted to start SGLT2i  2. NSVT - no significant ventricular arrhythmias on Event monitor 09/2016   3. 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 Continue ASA - No s/s ischemia  4. Paroxsymal A fib RVR 06/2015  - Has been off  amio. - 30-day event monitor in January 2018 no AF - He has stopped taking coumadin. Aware of increased risk of stroke.   5. Former smoker  - quit 06/25/2015.   6. Large Ventral Hernia - Considering mesh repair. Following with Dr. February 2018. - Given improved LVEF would be ok to proceed with hernia repair from a CV standpoint with only mild to moderate risk of peri-op CV complications - No change  7. HTN - Blood pressure well controlled. Continue current regimen.  8.  DM - followed by Dr. Modesto Charon. HgbA1c 5.8 - continue metformin - has not wanted SGLT2i   9. Carotid stenosis  - s/p R CEA ([facility])  Arvilla Meres, MD  5:46 PM

## 2022-02-16 ENCOUNTER — Ambulatory Visit (HOSPITAL_COMMUNITY)
Admission: RE | Admit: 2022-02-16 | Discharge: 2022-02-16 | Disposition: A | Discharge status: Home / Self Care | Payer: BC Managed Care – PPO | Source: Ambulatory Visit | Attending: Internal Medicine | Admitting: Internal Medicine

## 2022-02-16 ENCOUNTER — Encounter (HOSPITAL_COMMUNITY): Payer: Self-pay | Admitting: Internal Medicine

## 2022-02-16 VITALS — BP 170/72 | HR 77 | Wt 211.4 lb

## 2022-02-16 DIAGNOSIS — Z8249 Family history of ischemic heart disease and other diseases of the circulatory system: Secondary | ICD-10-CM | POA: Diagnosis not present

## 2022-02-16 DIAGNOSIS — I480 Paroxysmal atrial fibrillation: Secondary | ICD-10-CM

## 2022-02-16 DIAGNOSIS — I428 Other cardiomyopathies: Secondary | ICD-10-CM | POA: Diagnosis not present

## 2022-02-16 DIAGNOSIS — I6529 Occlusion and stenosis of unspecified carotid artery: Secondary | ICD-10-CM | POA: Insufficient documentation

## 2022-02-16 DIAGNOSIS — Z7984 Long term (current) use of oral hypoglycemic drugs: Secondary | ICD-10-CM | POA: Insufficient documentation

## 2022-02-16 DIAGNOSIS — I110 Hypertensive heart disease with heart failure: Secondary | ICD-10-CM | POA: Diagnosis not present

## 2022-02-16 DIAGNOSIS — I5022 Chronic systolic (congestive) heart failure: Secondary | ICD-10-CM | POA: Diagnosis not present

## 2022-02-16 DIAGNOSIS — E119 Type 2 diabetes mellitus without complications: Secondary | ICD-10-CM | POA: Diagnosis not present

## 2022-02-16 DIAGNOSIS — Z7901 Long term (current) use of anticoagulants: Secondary | ICD-10-CM | POA: Diagnosis not present

## 2022-02-16 DIAGNOSIS — Z79899 Other long term (current) drug therapy: Secondary | ICD-10-CM | POA: Diagnosis not present

## 2022-02-16 DIAGNOSIS — I10 Essential (primary) hypertension: Secondary | ICD-10-CM | POA: Diagnosis not present

## 2022-02-16 DIAGNOSIS — Z87891 Personal history of nicotine dependence: Secondary | ICD-10-CM | POA: Diagnosis not present

## 2022-02-16 DIAGNOSIS — I472 Ventricular tachycardia, unspecified: Secondary | ICD-10-CM | POA: Diagnosis not present

## 2022-02-16 DIAGNOSIS — I2510 Atherosclerotic heart disease of native coronary artery without angina pectoris: Secondary | ICD-10-CM | POA: Insufficient documentation

## 2022-02-16 DIAGNOSIS — K439 Ventral hernia without obstruction or gangrene: Secondary | ICD-10-CM | POA: Insufficient documentation

## 2022-02-16 LAB — BASIC METABOLIC PANEL
Anion gap: 8 (ref 5–15)
BUN: 20 mg/dL (ref 8–23)
CO2: 21 mmol/L — ABNORMAL LOW (ref 22–32)
Calcium: 9.1 mg/dL (ref 8.9–10.3)
Chloride: 108 mmol/L (ref 98–111)
Creatinine, Ser: 0.93 mg/dL (ref 0.61–1.24)
GFR, Estimated: >60 mL/min (ref >60)
Glucose, Bld: 136 mg/dL — ABNORMAL HIGH (ref 70–99)
Potassium: 4.4 mmol/L (ref 3.5–5.1)
Sodium: 137 mmol/L (ref 135–145)

## 2022-02-16 LAB — BRAIN NATRIURETIC PEPTIDE: B Natriuretic Peptide: 10.8 pg/mL (ref 0.0–100.0)

## 2022-02-16 MED ORDER — ENTRESTO 97-103 MG PO TABS: 1.0000 | ORAL_TABLET | Freq: Two times a day (BID) | ORAL | 11 refills | Status: DC

## 2022-02-16 NOTE — Patient Instructions (Addendum)
Medication Changes:  None, continue current medications  Lab Work:  Labs done today, your results will be available in MyChart, we will contact you for abnormal readings.  Testing/Procedures:  Your physician has requested that you have an echocardiogram. Echocardiography is a painless test that uses sound waves to create images of your heart. It provides your doctor with information about the size and shape of your heart and how well your heart's chambers and valves are working. This procedure takes approximately one hour. There are no restrictions for this procedure. IN 9 MONTHS WITH FOLLOW-UP APPOINTMENT  Referrals:  None  Special Instructions // Education:  Do the following things EVERYDAY: Weigh yourself in the morning before breakfast. Write it down and keep it in a log. Take your medicines as prescribed Eat low salt foods--Limit salt (sodium) to 2000 mg per day.  Stay as active as you can everyday Limit all fluids for the day to less than 2 liters   Follow-Up in: 9 months with an echocardiogram (February 2024), **PLEASE CALL OUR OFFICE IN December TO SCHEDULE THESE APPOINTMENTS  At the [facility], you and your health needs are our priority. We have a designated team specialized in the treatment of Heart Failure. This Care Team includes your primary Heart Failure Specialized Cardiologist (physician), Advanced Practice Providers (APPs- Physician Assistants and Nurse Practitioners), and Pharmacist who all work together to provide you with the care you need, when you need it.   You may see any of the following providers on your designated Care Team at your next follow up:  Dr Arvilla Meres Dr Carron Curie, NP Robbie Lis, [place] [facility] [place], [place] Karle Plumber, PharmD   Please be sure to bring in all your medications bottles to every appointment.   Need to Contact us:  If you have any questions or concerns before your  next appointment please send [place] a message through [place] or call our office at [phone].    TO LEAVE A MESSAGE FOR THE NURSE SELECT OPTION 2, PLEASE LEAVE A MESSAGE INCLUDING: YOUR NAME DATE OF BIRTH CALL BACK NUMBER REASON FOR CALL**this is important as we prioritize the call backs  YOU WILL RECEIVE A CALL BACK THE SAME DAY AS LONG AS YOU CALL BEFORE 4:00 PM

## 2022-04-05 DIAGNOSIS — I509 Heart failure, unspecified: Secondary | ICD-10-CM | POA: Diagnosis not present

## 2022-04-05 DIAGNOSIS — E1169 Type 2 diabetes mellitus with other specified complication: Secondary | ICD-10-CM | POA: Diagnosis not present

## 2022-04-05 DIAGNOSIS — I10 Essential (primary) hypertension: Secondary | ICD-10-CM | POA: Diagnosis not present

## 2022-04-05 DIAGNOSIS — Z6834 Body mass index (BMI) 34.0-34.9, adult: Secondary | ICD-10-CM | POA: Diagnosis not present

## 2022-04-05 DIAGNOSIS — E669 Obesity, unspecified: Secondary | ICD-10-CM | POA: Diagnosis not present

## 2022-04-05 DIAGNOSIS — E782 Mixed hyperlipidemia: Secondary | ICD-10-CM | POA: Diagnosis not present

## 2022-05-23 ENCOUNTER — Other Ambulatory Visit (HOSPITAL_COMMUNITY): Payer: Self-pay | Admitting: Internal Medicine

## 2022-08-16 ENCOUNTER — Other Ambulatory Visit (HOSPITAL_COMMUNITY): Payer: Self-pay | Admitting: Internal Medicine

## 2023-01-23 ENCOUNTER — Other Ambulatory Visit (HOSPITAL_COMMUNITY): Payer: Self-pay | Admitting: Internal Medicine

## 2023-01-23 DIAGNOSIS — I5022 Chronic systolic (congestive) heart failure: Secondary | ICD-10-CM

## 2023-03-09 DIAGNOSIS — J069 Acute upper respiratory infection, unspecified: Secondary | ICD-10-CM | POA: Diagnosis not present

## 2023-03-09 DIAGNOSIS — H6692 Otitis media, unspecified, left ear: Secondary | ICD-10-CM | POA: Diagnosis not present

## 2023-03-21 DIAGNOSIS — I480 Paroxysmal atrial fibrillation: Secondary | ICD-10-CM | POA: Diagnosis not present

## 2023-03-21 DIAGNOSIS — I10 Essential (primary) hypertension: Secondary | ICD-10-CM | POA: Diagnosis not present

## 2023-03-21 DIAGNOSIS — E1169 Type 2 diabetes mellitus with other specified complication: Secondary | ICD-10-CM | POA: Diagnosis not present

## 2023-03-21 DIAGNOSIS — I6521 Occlusion and stenosis of right carotid artery: Secondary | ICD-10-CM | POA: Diagnosis not present

## 2023-09-28 ENCOUNTER — Telehealth (HOSPITAL_COMMUNITY): Payer: Self-pay

## 2023-09-28 NOTE — Telephone Encounter (Signed)
 Received a fax requesting medical records from [facility]Dr. Romona Curls). Records were successfully faxed to: [phone] ,which was the number provided.. Medical request form will be scanned into patients chart.

## 2023-12-05 ENCOUNTER — Other Ambulatory Visit: Payer: Self-pay | Admitting: Student

## 2023-12-05 DIAGNOSIS — J324 Chronic pansinusitis: Secondary | ICD-10-CM

## 2023-12-07 ENCOUNTER — Ambulatory Visit
Admission: RE | Admit: 2023-12-07 | Discharge: 2023-12-07 | Disposition: A | Discharge status: Home / Self Care | Source: Ambulatory Visit | Attending: Student | Admitting: Student

## 2023-12-07 DIAGNOSIS — J324 Chronic pansinusitis: Secondary | ICD-10-CM

## 2023-12-18 ENCOUNTER — Other Ambulatory Visit (HOSPITAL_COMMUNITY): Payer: Self-pay | Admitting: Internal Medicine

## 2023-12-18 DIAGNOSIS — I5022 Chronic systolic (congestive) heart failure: Secondary | ICD-10-CM

## 2023-12-26 ENCOUNTER — Other Ambulatory Visit: Payer: Self-pay | Admitting: Unknown Physician Specialty

## 2023-12-26 DIAGNOSIS — J328 Other chronic sinusitis: Secondary | ICD-10-CM

## 2024-01-02 ENCOUNTER — Ambulatory Visit
Admission: RE | Admit: 2024-01-02 | Discharge: 2024-01-02 | Disposition: A | Discharge status: Home / Self Care | Source: Ambulatory Visit | Attending: Unknown Physician Specialty | Admitting: Unknown Physician Specialty

## 2024-01-02 DIAGNOSIS — J328 Other chronic sinusitis: Secondary | ICD-10-CM

## 2024-01-11 ENCOUNTER — Other Ambulatory Visit (HOSPITAL_COMMUNITY): Payer: Self-pay | Admitting: Internal Medicine

## 2024-01-11 DIAGNOSIS — I5022 Chronic systolic (congestive) heart failure: Secondary | ICD-10-CM

## 2024-02-06 ENCOUNTER — Ambulatory Visit (HOSPITAL_COMMUNITY): Payer: Self-pay | Admitting: Family Medicine

## 2024-02-06 ENCOUNTER — Ambulatory Visit (HOSPITAL_COMMUNITY)
Admission: RE | Admit: 2024-02-06 | Discharge: 2024-02-06 | Disposition: A | Discharge status: Home / Self Care | Source: Ambulatory Visit | Attending: Family Medicine | Admitting: Family Medicine

## 2024-02-06 ENCOUNTER — Encounter (HOSPITAL_COMMUNITY): Payer: Self-pay

## 2024-02-06 ENCOUNTER — Other Ambulatory Visit (HOSPITAL_COMMUNITY): Payer: Self-pay | Admitting: Internal Medicine

## 2024-02-06 VITALS — BP 151/77 | HR 77 | Ht 65.0 in | Wt 212.4 lb

## 2024-02-06 DIAGNOSIS — I5022 Chronic systolic (congestive) heart failure: Secondary | ICD-10-CM

## 2024-02-06 DIAGNOSIS — Z7984 Long term (current) use of oral hypoglycemic drugs: Secondary | ICD-10-CM | POA: Insufficient documentation

## 2024-02-06 DIAGNOSIS — I428 Other cardiomyopathies: Secondary | ICD-10-CM | POA: Insufficient documentation

## 2024-02-06 DIAGNOSIS — I480 Paroxysmal atrial fibrillation: Secondary | ICD-10-CM | POA: Diagnosis not present

## 2024-02-06 DIAGNOSIS — E119 Type 2 diabetes mellitus without complications: Secondary | ICD-10-CM | POA: Insufficient documentation

## 2024-02-06 DIAGNOSIS — Z7901 Long term (current) use of anticoagulants: Secondary | ICD-10-CM | POA: Insufficient documentation

## 2024-02-06 DIAGNOSIS — K439 Ventral hernia without obstruction or gangrene: Secondary | ICD-10-CM | POA: Diagnosis not present

## 2024-02-06 DIAGNOSIS — I10 Essential (primary) hypertension: Secondary | ICD-10-CM

## 2024-02-06 DIAGNOSIS — Z87891 Personal history of nicotine dependence: Secondary | ICD-10-CM | POA: Insufficient documentation

## 2024-02-06 DIAGNOSIS — I110 Hypertensive heart disease with heart failure: Secondary | ICD-10-CM | POA: Diagnosis not present

## 2024-02-06 DIAGNOSIS — I2510 Atherosclerotic heart disease of native coronary artery without angina pectoris: Secondary | ICD-10-CM | POA: Diagnosis not present

## 2024-02-06 DIAGNOSIS — I6521 Occlusion and stenosis of right carotid artery: Secondary | ICD-10-CM | POA: Diagnosis not present

## 2024-02-06 DIAGNOSIS — I4729 Other ventricular tachycardia: Secondary | ICD-10-CM | POA: Diagnosis not present

## 2024-02-06 DIAGNOSIS — I6529 Occlusion and stenosis of unspecified carotid artery: Secondary | ICD-10-CM

## 2024-02-06 LAB — LIPID PANEL
Cholesterol: 247 mg/dL — ABNORMAL HIGH (ref 0–200)
HDL: 31 mg/dL — ABNORMAL LOW (ref >40)
LDL Cholesterol: 180 mg/dL — ABNORMAL HIGH (ref 0–99)
Total CHOL/HDL Ratio: 8 ratio
Triglycerides: 179 mg/dL — ABNORMAL HIGH (ref <150)
VLDL: 36 mg/dL (ref 0–40)

## 2024-02-06 LAB — CBC
HCT: 39.7 % (ref 39.0–52.0)
Hemoglobin: 13.3 g/dL (ref 13.0–17.0)
MCH: 30.9 pg (ref 26.0–34.0)
MCHC: 33.5 g/dL (ref 30.0–36.0)
MCV: 92.1 fL (ref 80.0–100.0)
Platelets: 284 10*3/uL (ref 150–400)
RBC: 4.31 MIL/uL (ref 4.22–5.81)
RDW: 13.8 % (ref 11.5–15.5)
WBC: 8.2 10*3/uL (ref 4.0–10.5)
nRBC: 0 % (ref 0.0–0.2)

## 2024-02-06 LAB — HEMOGLOBIN A1C
Hgb A1c MFr Bld: 5.8 % — ABNORMAL HIGH (ref 4.8–5.6)
Mean Plasma Glucose: 119.76 mg/dL

## 2024-02-06 LAB — BRAIN NATRIURETIC PEPTIDE: B Natriuretic Peptide: 18.3 pg/mL (ref 0.0–100.0)

## 2024-02-06 NOTE — Progress Notes (Signed)
 Advanced Heart Failure Clinic Note   Patient ID: [name], male   DOB: April 18, 1961, 63 y.o.   MRN: [id number]   PCP: [name] at [facility] HF Cardiologist: [name]  HPI: [name] is a 63 y.o. male with systolic HF due to NICM, PAF, DM2, carotid stenosis s/p R CEA and HTN.   Admitted 10/16 with progressive dyspnea on exertion. Echo EF 25-30%. RHC/ LHC. showed minimal obstructive CAD, severe NICM, elevated filling pressures, and cardiogenic shock. Required milrinone . cMRI EF 19%, mod [name], normal RV, and no infiltrative process or scar.  Also had A fib RVR but chemically converted with amio 200 mg twice daily. Started on coumadin  (Refused DOAC).  He insisted on stopping anticoagulation. Has worn 30-day monitor in January-February 2018 with no AF. AC stopped  Echo 07/18/18  EF 50-55%   Echo 2/22 EF 55%  Today he returns for HF follow up. We have not seen him since 5/23. Overall feeling fine. He works full time at [employer], walks 13-16k steps/day without SOB. Denies palpitations, abnormal bleeding, CP, dizziness, edema, or PND/Orthopnea. Appetite ok. Weight at home 210 pounds. Taking all medications. Takes Lasix  2x/week. Had flu A at beginning of year.  Checks BP at home ~112-120/70s.  Cardiac studies: Echo 2/22: EF 55% Echo 10/19: EF 50-55%  Echo 1/18: EF 40-45% Echo 7/17: EF 30%  Seems to be 35-40% on Definity  images  Echo 1/17: EF 25-30% RV ok  Review of systems complete and found to be negative unless listed in HPI.   SH:  Social History   Socioeconomic History   Marital status: Divorced    Spouse name: Not on file   Number of children: Not on file   Years of education: Not on file   Highest education level: Not on file  Occupational History   Not on file  Tobacco Use   Smoking status: Former    Current packs/day: 0.00    Types: Cigarettes    Quit date: 06/25/2015    Years since quitting: 8.6   Smokeless tobacco: Never   Tobacco comments:    on  and off , quite for 10 years twice  Vaping Use   Vaping status: Never Used  Substance and Sexual Activity   Alcohol use: Not Currently    Alcohol/week: 0.0 standard drinks of alcohol    Comment: none since 2016- CHF admission   Drug use: Never   Sexual activity: Not on file  Other Topics Concern   Not on file  Social History Narrative   Not on file   Social Drivers of Health   Financial Resource Strain: Not on file  Food Insecurity: Not on file  Transportation Needs: Not on file  Physical Activity: Not on file  Stress: Not on file  Social Connections: Not on file  Intimate Partner Violence: Not on file   FH:  Family History  Problem Relation Age of Onset   Hypertension Mother    Past Medical History:  Diagnosis Date   A-fib [facility]) 2013   chemically converted   Arthritis    "here and there" -   Carotid artery occlusion    CHF (congestive heart failure) (HCC)    Complication of anesthesia    Slow to awaken with colon surgery- early 2000   Diabetes mellitus without complication (HCC)    type II   Diverticulitis    ACTIVE WITH ABCESS   History of blood transfusion    2000ish diverticulitis   Hypertension  Peripheral vascular disease (HCC)    Pneumonia 2010    Current Outpatient Medications  Medication Sig Dispense Refill   acetaminophen  (TYLENOL ) 500 MG tablet Take 500 mg by mouth every 6 (six) hours as needed for mild pain or moderate pain.     aspirin  81 MG tablet Take 81 mg by mouth daily.     BLACK ELDERBERRY,BERRY-FLOWER, PO Take 4,250 mg by mouth daily. 5 ml     cholecalciferol (VITAMIN D) 25 MCG (1000 UT) tablet Take 1,000 mg by mouth daily.      Coenzyme Q10 200 MG TABS Take 200 mg by mouth daily.      Cyanocobalamin (VITAMIN B-12) 5000 MCG SUBL Place 5,000 mcg under the tongue daily.     ENTRESTO  97-103 MG Take 1 tablet by mouth twice daily 60 tablet 0   furosemide  (LASIX ) 40 MG tablet Take 1 tablet (40 mg total) by mouth daily. 90 tablet 3   Krill  Oil 500 MG CAPS Take 500 mg by mouth daily.     metFORMIN  (GLUCOPHAGE ) 500 MG tablet Take 500 mg by mouth daily.      metoprolol  succinate (TOPROL -XL) 50 MG 24 hr tablet Take 1 tablet once daily 30 tablet 6   Multiple Vitamins-Minerals (MULTIVITAL PO) Take 1 tablet by mouth daily. MENS MEGA MULTIVIT.     Probiotic Product (PROBIOTIC DAILY PO) Take 1 capsule by mouth daily. [employer]     No current facility-administered medications for this encounter.   Wt Readings from Last 3 Encounters:  02/06/24 96.3 kg (212 lb 6.4 oz)  02/16/22 95.9 kg (211 lb 6.4 oz)  07/12/21 94.6 kg (208 lb 8 oz)   BP (!) 151/77   Pulse 77   Ht 5\' 5"  (1.651 m)   Wt 96.3 kg (212 lb 6.4 oz)   SpO2 96%   BMI 35.35 kg/m   PHYSICAL EXAM: General:  NAD. No resp difficulty, walked into clinic HEENT: Normal Neck: Supple. No JVD. Thick neck Cor: Regular rate & rhythm. No rubs, gallops or murmurs. Lungs: Clear Abdomen: Soft, obese, nontender, nondistended. Abd binder on Extremities: No cyanosis, clubbing, rash, edema Neuro: Alert & oriented x 3, moves all 4 extremities w/o difficulty. Affect pleasant.  ECG (personally reviewed): NSR 75 bpm  ASSESSMENT & PLAN: 1. Chronic Systolic HF.  - LVEF 15-20% -> cardiogenic shock. Cath with minimal non-obstructive CAD - cMRI 910/16): NICM EF 19% mod [name]. RV normal. No infiltrative process.  - Echo 1/18: EF 40-45% - Echo 10/19: EF 50-55% - Echo 2/22: EF 55% - NYHA I. Volume status looks great.  - Continue Lasix  PRN - Continue Entresto  97/103 mg bid - Continue Lasix  40 mg daily. - Continue Toprol  XL 50 mg daily (failed 75 mg several times) - Off spiro 2/2 hyperkalemia, later retrialed and patient felt it caused palpitations - He has not wanted to start SGLT2i, discussed again today. - Labs today. - Update echo  2. NSVT - no significant ventricular arrhythmias on event monitor 09/2016 - NSR on ECG - Denies palpitations   3. CAD - minimal nonobstructive CAD on cath  06/29/15 - No chest pain - Refuses statin.  - Continue ASA  4. Paroxsymal A fib RVR 06/2015  - Has been off amio. - 30-day event monitor 09/2016 no AF - He has stopped taking coumadin . Aware of increased risk of stroke.  - NSR on ECG today  5. Former smoker  - quit 06/25/2015.  - Remains quit, congratulations  6. Large Ventral  Hernia - Had been considering mesh repair, has now decided against surgery - Wears abd binder, no change.  7. HTN - BP elevated in clinic.  - Home BP acceptable at ~112-120/70s.  8. DM - Followed by PCP - Continue metformin  - Has not wanted SGLT2i   9. Carotid stenosis  - s/p R CEA  - Followed by Dr. Charlotte Cookey - On ASA, declines statin  Follow up in 9 months with Dr. Bensimhon. Update echo.  Elmarie Hacking, FNP  10:36 AM

## 2024-02-06 NOTE — Patient Instructions (Signed)
 No change in medications. Labs today - will call you if abnormal. Echo has been ordered for you - see below.  Return to see Dr. Angelena Kells in Heart Failure Clinic in 9 months. PLEASE CALL US  AT [phone] IN JANUARY TO SCHEDULE THIS APPOINTMENT. Please call us  at [phone] if any questions or concerns prior to your next appointment.

## 2024-02-13 ENCOUNTER — Other Ambulatory Visit (HOSPITAL_COMMUNITY): Payer: Self-pay

## 2024-02-13 DIAGNOSIS — I5022 Chronic systolic (congestive) heart failure: Secondary | ICD-10-CM

## 2024-02-13 MED ORDER — ENTRESTO 97-103 MG PO TABS: 1.0000 | ORAL_TABLET | Freq: Two times a day (BID) | ORAL | 5 refills | Status: DC

## 2024-02-14 ENCOUNTER — Other Ambulatory Visit (HOSPITAL_COMMUNITY): Payer: Self-pay | Admitting: Internal Medicine

## 2024-03-26 ENCOUNTER — Ambulatory Visit (HOSPITAL_COMMUNITY)
Admission: RE | Admit: 2024-03-26 | Discharge: 2024-03-26 | Disposition: A | Discharge status: Home / Self Care | Source: Ambulatory Visit | Attending: Internal Medicine | Admitting: Internal Medicine

## 2024-03-26 DIAGNOSIS — I5022 Chronic systolic (congestive) heart failure: Secondary | ICD-10-CM | POA: Diagnosis present

## 2024-03-26 DIAGNOSIS — I4891 Unspecified atrial fibrillation: Secondary | ICD-10-CM | POA: Diagnosis not present

## 2024-03-26 DIAGNOSIS — I110 Hypertensive heart disease with heart failure: Secondary | ICD-10-CM | POA: Insufficient documentation

## 2024-03-26 DIAGNOSIS — E785 Hyperlipidemia, unspecified: Secondary | ICD-10-CM | POA: Insufficient documentation

## 2024-03-26 DIAGNOSIS — E119 Type 2 diabetes mellitus without complications: Secondary | ICD-10-CM | POA: Insufficient documentation

## 2024-03-26 DIAGNOSIS — I351 Nonrheumatic aortic (valve) insufficiency: Secondary | ICD-10-CM | POA: Insufficient documentation

## 2024-03-26 DIAGNOSIS — I429 Cardiomyopathy, unspecified: Secondary | ICD-10-CM | POA: Insufficient documentation

## 2024-03-26 DIAGNOSIS — I5021 Acute systolic (congestive) heart failure: Secondary | ICD-10-CM | POA: Diagnosis present

## 2024-03-26 LAB — ECHOCARDIOGRAM COMPLETE
Area-P 1/2: 4.39 cm2
Calc EF: 59.5 %
S' Lateral: 4.55 cm
Single Plane A2C EF: 62.9 %
Single Plane A4C EF: 57.5 %

## 2024-07-29 ENCOUNTER — Other Ambulatory Visit: Payer: Self-pay

## 2024-07-29 ENCOUNTER — Emergency Department (HOSPITAL_BASED_OUTPATIENT_CLINIC_OR_DEPARTMENT_OTHER)

## 2024-07-29 ENCOUNTER — Encounter (HOSPITAL_BASED_OUTPATIENT_CLINIC_OR_DEPARTMENT_OTHER): Payer: Self-pay

## 2024-07-29 ENCOUNTER — Emergency Department (HOSPITAL_BASED_OUTPATIENT_CLINIC_OR_DEPARTMENT_OTHER)
Admission: EM | Admit: 2024-07-29 | Discharge: 2024-07-29 | Disposition: A | Discharge status: Home / Self Care | Attending: Emergency Medicine | Admitting: Emergency Medicine

## 2024-07-29 DIAGNOSIS — J069 Acute upper respiratory infection, unspecified: Secondary | ICD-10-CM | POA: Diagnosis not present

## 2024-07-29 DIAGNOSIS — Z7982 Long term (current) use of aspirin: Secondary | ICD-10-CM | POA: Insufficient documentation

## 2024-07-29 DIAGNOSIS — R059 Cough, unspecified: Secondary | ICD-10-CM | POA: Diagnosis present

## 2024-07-29 LAB — RESP PANEL BY RT-PCR (RSV, FLU A&B, COVID)  RVPGX2
Influenza A by PCR: NEGATIVE
Influenza B by PCR: NEGATIVE
Resp Syncytial Virus by PCR: NEGATIVE
SARS Coronavirus 2 by RT PCR: NEGATIVE

## 2024-07-29 LAB — GROUP A STREP BY PCR: Group A Strep by PCR: NOT DETECTED

## 2024-07-29 MED ORDER — IPRATROPIUM BROMIDE 0.03 % NA SOLN: 2.0000 | Freq: Three times a day (TID) | NASAL | 0 refills | Status: AC

## 2024-07-29 NOTE — ED Triage Notes (Signed)
 C/o cough, runny nose, congestion the past few days.

## 2024-07-29 NOTE — ED Provider Notes (Signed)
 [place] [facility] Provider Note   CSN: [id number] Arrival date & time: 07/29/24  9193     Patient presents with: Cough   [name] is a 63 y.o. male.   HPI 63 year old male presents with a cough and runny nose.  Symptoms originally started 3-4 days ago but got a lot worse yesterday.  He is primarily having clear rhinorrhea as well as a lot of postnasal drip that he feels like is causing cough.  He denies fevers, shortness of breath, chest pain or leg swelling.  No significant headache.  Was unable to get much sleep due to the amount of coughing from postnasal drip last night.  Prior to Admission medications   Medication Sig Start Date End Date Taking? Authorizing Provider  ipratropium (ATROVENT ) 0.03 % nasal spray Place 2 sprays into both nostrils 3 (three) times daily. 07/29/24  Yes Freddi Hamilton, MD  acetaminophen  (TYLENOL ) 500 MG tablet Take 500 mg by mouth every 6 (six) hours as needed for mild pain or moderate pain.    [provider]  aspirin  81 MG tablet Take 81 mg by mouth daily.    [provider]  BLACK ELDERBERRY,BERRY-FLOWER, PO Take 4,250 mg by mouth daily. 5 ml    [provider]  cholecalciferol (VITAMIN D) 25 MCG (1000 UT) tablet Take 1,000 mg by mouth daily.     [provider]  Coenzyme Q10 200 MG TABS Take 200 mg by mouth daily.     [provider]  Cyanocobalamin (VITAMIN B-12) 5000 MCG SUBL Place 5,000 mcg under the tongue daily.    [provider]  furosemide  (LASIX ) 40 MG tablet Take 1 tablet by mouth once daily 02/16/24   Bensimhon, Daniel R, MD  Krill Oil 500 MG CAPS Take 500 mg by mouth daily.    [provider]  metFORMIN  (GLUCOPHAGE ) 500 MG tablet Take 500 mg by mouth daily.     [provider]  metoprolol  succinate (TOPROL -XL) 50 MG 24 hr tablet Take 1 tablet once daily 04/14/16   Bensimhon, Daniel R, MD  Multiple Vitamins-Minerals  (MULTIVITAL PO) Take 1 tablet by mouth daily. MENS MEGA MULTIVIT.    [provider]  Probiotic Product (PROBIOTIC DAILY PO) Take 1 capsule by mouth daily. [employer]    [provider]  sacubitril -valsartan  (ENTRESTO ) 97-103 MG Take 1 tablet by mouth 2 (two) times daily. 02/13/24   Glena Harlene CHRISTELLA, FNP    Allergies: Pseudoephedrine and Carvedilol     Review of Systems  Constitutional:  Negative for fever.  HENT:  Positive for rhinorrhea and sore throat.   Respiratory:  Positive for cough. Negative for shortness of breath.   Cardiovascular:  Negative for chest pain and leg swelling.    Updated Vital Signs BP (!) 115/96 (BP Location: Right Arm)   Pulse 91   Temp 97.9 F (36.6 C) (Oral)   Resp 18   Ht 5' 5 (1.651 m)   Wt 96.6 kg   SpO2 95%   BMI 35.45 kg/m   Physical Exam Vitals and nursing note reviewed.  Constitutional:      General: He is not in acute distress.    Appearance: He is well-developed. He is not ill-appearing or diaphoretic.  HENT:     Head: Normocephalic and atraumatic.     Mouth/Throat:     Mouth: Mucous membranes are moist.     Pharynx: Oropharynx is clear. No oropharyngeal exudate or posterior oropharyngeal erythema.  Cardiovascular:     Rate and Rhythm: Normal rate and regular rhythm.     Heart sounds: Normal heart sounds.  Pulmonary:     Effort: Pulmonary effort is normal.     Breath sounds: Normal breath sounds. No rales.  Abdominal:     General: There is no distension.     Palpations: Abdomen is soft.  Musculoskeletal:     Right lower leg: No edema.     Left lower leg: No edema.  Skin:    General: Skin is warm and dry.  Neurological:     Mental Status: He is alert.     (all labs ordered are listed, but only abnormal results are displayed) Labs Reviewed  RESP PANEL BY RT-PCR (RSV, FLU A&B, COVID)  [id number]  GROUP A STREP BY PCR    EKG: None  Radiology: DG Chest 2 View Result Date: 07/29/2024 EXAM: 2 VIEW(S) XRAY  OF THE CHEST 07/29/2024 09:06:00 AM COMPARISON: None available. CLINICAL HISTORY: cough FINDINGS: LUNGS AND PLEURA: No focal pulmonary opacity. No pulmonary edema. No pleural effusion. No pneumothorax. HEART AND MEDIASTINUM: No acute abnormality of the cardiac and mediastinal silhouettes. BONES AND SOFT TISSUES: Multilevel degenerative disc disease of the spine. IMPRESSION: 1. No acute cardiopulmonary process. Electronically signed by: Norleen Boxer MD 07/29/2024 09:40 AM EST RP Workstation: [id number]     Procedures   Medications Ordered in the ED - No data to display                                  Medical Decision Making Amount and/or Complexity of Data Reviewed Labs:     Details: Resp panel negative. Radiology: ordered and independent interpretation performed.    Details: No pneumonia  Risk Prescription drug management.   Patient appears to have a viral upper respiratory infection.  Strep and COVID swabs obtained in triage are negative.  X-ray negative for pneumonia.  Low suspicion for occult pneumonia.  O2 sats are normal.  He is requesting a prescription for a nasal spray that he has had worked before that he thinks was ipratropium.  This has been prescribed and sent to his pharmacy.  Otherwise appears stable for discharge home with supportive care for viral URI.     Final diagnoses:  Viral upper respiratory infection    ED Discharge Orders          Ordered    ipratropium (ATROVENT ) 0.03 % nasal spray  3 times daily        07/29/24 9046               Freddi Hamilton, MD 07/29/24 [phone]

## 2024-07-29 NOTE — Discharge Instructions (Signed)
 Follow-up with your primary care physician if not improving over the next several days.  Otherwise, return to the ER for if you develop fever, coughing up blood, trouble breathing, or any other new/concerning symptoms.

## 2024-09-22 ENCOUNTER — Other Ambulatory Visit (HOSPITAL_COMMUNITY): Payer: Self-pay | Admitting: Family Medicine

## 2024-09-22 DIAGNOSIS — I5022 Chronic systolic (congestive) heart failure: Secondary | ICD-10-CM
